# Patient Record
Sex: Male | Born: 1955 | Race: White | Hispanic: No | Marital: Married | Smoking: Former smoker
Health system: Southern US, Community
[De-identification: ages and names within clinical notes are randomized; demographics above are authoritative.]

## PROBLEM LIST (undated history)

## (undated) DIAGNOSIS — E785 Hyperlipidemia, unspecified: Secondary | ICD-10-CM

## (undated) DIAGNOSIS — R319 Hematuria, unspecified: Secondary | ICD-10-CM

## (undated) DIAGNOSIS — N1330 Unspecified hydronephrosis: Secondary | ICD-10-CM

## (undated) DIAGNOSIS — E119 Type 2 diabetes mellitus without complications: Secondary | ICD-10-CM

## (undated) DIAGNOSIS — G43909 Migraine, unspecified, not intractable, without status migrainosus: Secondary | ICD-10-CM

## (undated) DIAGNOSIS — N183 Chronic kidney disease, stage 3 unspecified: Secondary | ICD-10-CM

## (undated) DIAGNOSIS — Z87442 Personal history of urinary calculi: Secondary | ICD-10-CM

## (undated) DIAGNOSIS — N2581 Secondary hyperparathyroidism of renal origin: Secondary | ICD-10-CM

## (undated) DIAGNOSIS — K219 Gastro-esophageal reflux disease without esophagitis: Secondary | ICD-10-CM

## (undated) DIAGNOSIS — N189 Chronic kidney disease, unspecified: Secondary | ICD-10-CM

## (undated) DIAGNOSIS — J309 Allergic rhinitis, unspecified: Secondary | ICD-10-CM

## (undated) DIAGNOSIS — I10 Essential (primary) hypertension: Secondary | ICD-10-CM

## (undated) DIAGNOSIS — D631 Anemia in chronic kidney disease: Secondary | ICD-10-CM

## (undated) DIAGNOSIS — N281 Cyst of kidney, acquired: Secondary | ICD-10-CM

## (undated) HISTORY — PX: FRACTURE SURGERY: SHX138

## (undated) HISTORY — DX: Essential (primary) hypertension: I10

## (undated) HISTORY — PX: LASIK: SHX215

## (undated) HISTORY — DX: Gastro-esophageal reflux disease without esophagitis: K21.9

## (undated) HISTORY — DX: Hyperlipidemia, unspecified: E78.5

## (undated) HISTORY — DX: Migraine, unspecified, not intractable, without status migrainosus: G43.909

---

## 1976-05-21 DIAGNOSIS — G43809 Other migraine, not intractable, without status migrainosus: Secondary | ICD-10-CM | POA: Insufficient documentation

## 1977-12-19 HISTORY — PX: PARTIAL NEPHRECTOMY: SHX414

## 1978-05-21 DIAGNOSIS — K219 Gastro-esophageal reflux disease without esophagitis: Secondary | ICD-10-CM | POA: Insufficient documentation

## 1978-05-21 DIAGNOSIS — I10 Essential (primary) hypertension: Secondary | ICD-10-CM

## 1978-05-21 HISTORY — DX: Essential (primary) hypertension: I10

## 1978-05-21 HISTORY — DX: Gastro-esophageal reflux disease without esophagitis: K21.9

## 1985-05-21 DIAGNOSIS — M109 Gout, unspecified: Secondary | ICD-10-CM | POA: Insufficient documentation

## 1988-05-21 DIAGNOSIS — E782 Mixed hyperlipidemia: Secondary | ICD-10-CM | POA: Insufficient documentation

## 1988-05-21 DIAGNOSIS — E785 Hyperlipidemia, unspecified: Secondary | ICD-10-CM | POA: Insufficient documentation

## 1988-05-21 HISTORY — DX: Hyperlipidemia, unspecified: E78.5

## 2001-10-15 ENCOUNTER — Encounter: Payer: Self-pay | Admitting: Family Medicine

## 2001-10-15 ENCOUNTER — Encounter: Admission: RE | Admit: 2001-10-15 | Discharge: 2001-10-15 | Payer: Self-pay | Admitting: Family Medicine

## 2002-01-19 ENCOUNTER — Encounter: Payer: Self-pay | Admitting: Family Medicine

## 2002-01-19 LAB — CONVERTED CEMR LAB
PSA: 0.8 ng/mL
PSA: 0.8 ng/mL

## 2002-10-20 ENCOUNTER — Encounter: Payer: Self-pay | Admitting: Family Medicine

## 2002-10-20 LAB — CONVERTED CEMR LAB
PSA: 0.9 ng/mL
PSA: 0.9 ng/mL

## 2003-11-19 ENCOUNTER — Encounter: Payer: Self-pay | Admitting: Family Medicine

## 2003-11-19 LAB — CONVERTED CEMR LAB
PSA: 0.8 ng/mL
PSA: 0.8 ng/mL

## 2004-06-01 ENCOUNTER — Ambulatory Visit: Payer: Self-pay | Admitting: Family Medicine

## 2004-11-30 ENCOUNTER — Ambulatory Visit: Payer: Self-pay | Admitting: Family Medicine

## 2004-12-01 ENCOUNTER — Ambulatory Visit: Payer: Self-pay | Admitting: Family Medicine

## 2005-01-19 ENCOUNTER — Encounter: Payer: Self-pay | Admitting: Family Medicine

## 2005-01-19 LAB — CONVERTED CEMR LAB
PSA: 1.6 ng/mL
PSA: 1.6 ng/mL

## 2005-08-02 ENCOUNTER — Ambulatory Visit: Payer: Self-pay | Admitting: Family Medicine

## 2006-03-18 ENCOUNTER — Ambulatory Visit: Payer: Self-pay | Admitting: Family Medicine

## 2006-05-09 ENCOUNTER — Ambulatory Visit: Payer: Self-pay | Admitting: Family Medicine

## 2006-05-16 ENCOUNTER — Ambulatory Visit: Payer: Self-pay | Admitting: Family Medicine

## 2006-06-14 ENCOUNTER — Ambulatory Visit: Payer: Self-pay | Admitting: Family Medicine

## 2006-07-19 ENCOUNTER — Emergency Department (HOSPITAL_COMMUNITY): Admission: EM | Admit: 2006-07-19 | Discharge: 2006-07-19 | Payer: Self-pay | Admitting: Emergency Medicine

## 2006-08-20 ENCOUNTER — Encounter: Payer: Self-pay | Admitting: Family Medicine

## 2006-08-20 LAB — CONVERTED CEMR LAB: PSA: 0.69 ng/mL

## 2006-08-22 ENCOUNTER — Ambulatory Visit: Payer: Self-pay | Admitting: Family Medicine

## 2006-08-22 LAB — CONVERTED CEMR LAB
ALT: 47 units/L — ABNORMAL HIGH (ref 0–40)
AST: 33 units/L (ref 0–37)
Albumin: 3.7 g/dL (ref 3.5–5.2)
Alkaline Phosphatase: 77 units/L (ref 39–117)
BUN: 16 mg/dL (ref 6–23)
Basophils Absolute: 0 10*3/uL (ref 0.0–0.1)
Basophils Relative: 0.4 % (ref 0.0–1.0)
Bilirubin, Direct: 0.1 mg/dL (ref 0.0–0.3)
CO2: 27 meq/L (ref 19–32)
Calcium: 9.1 mg/dL (ref 8.4–10.5)
Chloride: 107 meq/L (ref 96–112)
Cholesterol: 220 mg/dL (ref 0–200)
Creatinine, Ser: 1.3 mg/dL (ref 0.4–1.5)
Direct LDL: 149.5 mg/dL
Eosinophils Absolute: 0.2 10*3/uL (ref 0.0–0.6)
Eosinophils Relative: 3.7 % (ref 0.0–5.0)
GFR calc Af Amer: 75 mL/min
GFR calc non Af Amer: 62 mL/min
Glucose, Bld: 110 mg/dL — ABNORMAL HIGH (ref 70–99)
HCT: 43.8 % (ref 39.0–52.0)
HDL: 44.4 mg/dL (ref >39.0)
Hemoglobin: 15.1 g/dL (ref 13.0–17.0)
Lymphocytes Relative: 36.4 % (ref 12.0–46.0)
MCHC: 34.6 g/dL (ref 30.0–36.0)
MCV: 93.3 fL (ref 78.0–100.0)
Monocytes Absolute: 0.6 10*3/uL (ref 0.2–0.7)
Monocytes Relative: 11 % (ref 3.0–11.0)
Neutro Abs: 2.4 10*3/uL (ref 1.4–7.7)
Neutrophils Relative %: 48.5 % (ref 43.0–77.0)
PSA: 0.69 ng/mL (ref 0.10–4.00)
Platelets: 195 10*3/uL (ref 150–400)
Potassium: 4.1 meq/L (ref 3.5–5.1)
RBC: 4.69 M/uL (ref 4.22–5.81)
RDW: 12.3 % (ref 11.5–14.6)
Sodium: 139 meq/L (ref 135–145)
TSH: 1.81 microintl units/mL (ref 0.35–5.50)
Total Bilirubin: 0.8 mg/dL (ref 0.3–1.2)
Total CHOL/HDL Ratio: 5
Total Protein: 6.9 g/dL (ref 6.0–8.3)
Triglycerides: 152 mg/dL — ABNORMAL HIGH (ref 0–149)
Uric Acid, Serum: 8.6 mg/dL — ABNORMAL HIGH (ref 2.4–7.0)
VLDL: 30 mg/dL (ref 0–40)
WBC: 5 10*3/uL (ref 4.5–10.5)

## 2006-08-27 ENCOUNTER — Encounter: Payer: Self-pay | Admitting: Family Medicine

## 2006-08-27 ENCOUNTER — Ambulatory Visit: Payer: Self-pay | Admitting: Family Medicine

## 2006-08-27 DIAGNOSIS — R05 Cough: Secondary | ICD-10-CM

## 2006-08-27 DIAGNOSIS — R059 Cough, unspecified: Secondary | ICD-10-CM | POA: Insufficient documentation

## 2006-10-10 ENCOUNTER — Telehealth (INDEPENDENT_AMBULATORY_CARE_PROVIDER_SITE_OTHER): Payer: Self-pay | Admitting: *Deleted

## 2006-11-04 ENCOUNTER — Telehealth (INDEPENDENT_AMBULATORY_CARE_PROVIDER_SITE_OTHER): Payer: Self-pay | Admitting: *Deleted

## 2006-11-29 ENCOUNTER — Encounter: Payer: Self-pay | Admitting: Family Medicine

## 2006-12-02 ENCOUNTER — Ambulatory Visit: Payer: Self-pay | Admitting: Family Medicine

## 2006-12-02 LAB — CONVERTED CEMR LAB
BUN: 18 mg/dL (ref 6–23)
CO2: 30 meq/L (ref 19–32)
Calcium: 9.1 mg/dL (ref 8.4–10.5)
Chloride: 105 meq/L (ref 96–112)
Creatinine, Ser: 1.2 mg/dL (ref 0.4–1.5)
GFR calc Af Amer: 82 mL/min
GFR calc non Af Amer: 68 mL/min
Glucose, Bld: 104 mg/dL — ABNORMAL HIGH (ref 70–99)
Potassium: 4.3 meq/L (ref 3.5–5.1)
Sodium: 141 meq/L (ref 135–145)

## 2006-12-04 ENCOUNTER — Ambulatory Visit: Payer: Self-pay | Admitting: Family Medicine

## 2006-12-24 ENCOUNTER — Telehealth (INDEPENDENT_AMBULATORY_CARE_PROVIDER_SITE_OTHER): Payer: Self-pay | Admitting: *Deleted

## 2007-02-18 ENCOUNTER — Telehealth (INDEPENDENT_AMBULATORY_CARE_PROVIDER_SITE_OTHER): Payer: Self-pay | Admitting: *Deleted

## 2007-03-04 ENCOUNTER — Telehealth: Payer: Self-pay | Admitting: Family Medicine

## 2007-03-14 ENCOUNTER — Telehealth (INDEPENDENT_AMBULATORY_CARE_PROVIDER_SITE_OTHER): Payer: Self-pay | Admitting: *Deleted

## 2007-04-11 ENCOUNTER — Telehealth (INDEPENDENT_AMBULATORY_CARE_PROVIDER_SITE_OTHER): Payer: Self-pay | Admitting: *Deleted

## 2007-04-20 ENCOUNTER — Emergency Department (HOSPITAL_COMMUNITY): Admission: EM | Admit: 2007-04-20 | Discharge: 2007-04-20 | Payer: Self-pay | Admitting: Emergency Medicine

## 2007-04-21 ENCOUNTER — Ambulatory Visit: Payer: Self-pay | Admitting: Family Medicine

## 2007-04-21 DIAGNOSIS — E669 Obesity, unspecified: Secondary | ICD-10-CM | POA: Insufficient documentation

## 2007-05-14 ENCOUNTER — Telehealth: Payer: Self-pay | Admitting: Family Medicine

## 2007-06-09 ENCOUNTER — Telehealth (INDEPENDENT_AMBULATORY_CARE_PROVIDER_SITE_OTHER): Payer: Self-pay | Admitting: *Deleted

## 2007-07-04 ENCOUNTER — Telehealth: Payer: Self-pay | Admitting: Family Medicine

## 2007-07-25 ENCOUNTER — Encounter: Payer: Self-pay | Admitting: Family Medicine

## 2007-08-04 ENCOUNTER — Telehealth: Payer: Self-pay | Admitting: Family Medicine

## 2007-09-11 ENCOUNTER — Telehealth: Payer: Self-pay | Admitting: Family Medicine

## 2007-09-17 ENCOUNTER — Ambulatory Visit: Payer: Self-pay | Admitting: Family Medicine

## 2007-09-22 ENCOUNTER — Telehealth (INDEPENDENT_AMBULATORY_CARE_PROVIDER_SITE_OTHER): Payer: Self-pay | Admitting: Internal Medicine

## 2007-09-22 ENCOUNTER — Ambulatory Visit: Payer: Self-pay | Admitting: Internal Medicine

## 2007-11-06 ENCOUNTER — Telehealth: Payer: Self-pay | Admitting: Family Medicine

## 2007-11-17 ENCOUNTER — Telehealth: Payer: Self-pay | Admitting: Family Medicine

## 2008-02-10 ENCOUNTER — Ambulatory Visit: Payer: Self-pay | Admitting: Family Medicine

## 2008-02-17 ENCOUNTER — Ambulatory Visit: Payer: Self-pay | Admitting: Family Medicine

## 2008-06-03 ENCOUNTER — Telehealth (INDEPENDENT_AMBULATORY_CARE_PROVIDER_SITE_OTHER): Payer: Self-pay | Admitting: *Deleted

## 2008-06-03 ENCOUNTER — Encounter: Payer: Self-pay | Admitting: Family Medicine

## 2008-06-10 ENCOUNTER — Ambulatory Visit: Payer: Self-pay | Admitting: Family Medicine

## 2008-06-10 DIAGNOSIS — G479 Sleep disorder, unspecified: Secondary | ICD-10-CM | POA: Insufficient documentation

## 2008-06-30 ENCOUNTER — Ambulatory Visit: Payer: Self-pay | Admitting: Family Medicine

## 2008-06-30 DIAGNOSIS — F411 Generalized anxiety disorder: Secondary | ICD-10-CM | POA: Insufficient documentation

## 2008-07-06 ENCOUNTER — Ambulatory Visit: Payer: Self-pay | Admitting: Professional

## 2008-07-08 ENCOUNTER — Ambulatory Visit: Payer: Self-pay | Admitting: Family Medicine

## 2008-07-13 ENCOUNTER — Ambulatory Visit: Payer: Self-pay | Admitting: Professional

## 2008-07-23 ENCOUNTER — Telehealth (INDEPENDENT_AMBULATORY_CARE_PROVIDER_SITE_OTHER): Payer: Self-pay | Admitting: Internal Medicine

## 2008-07-27 ENCOUNTER — Ambulatory Visit: Payer: Self-pay | Admitting: Professional

## 2008-08-05 ENCOUNTER — Ambulatory Visit: Payer: Self-pay | Admitting: Family Medicine

## 2008-08-17 ENCOUNTER — Ambulatory Visit: Payer: Self-pay | Admitting: Family Medicine

## 2008-08-18 ENCOUNTER — Telehealth: Payer: Self-pay | Admitting: Family Medicine

## 2008-10-27 ENCOUNTER — Ambulatory Visit: Payer: Self-pay | Admitting: Family Medicine

## 2008-10-28 LAB — CONVERTED CEMR LAB
BUN: 21 mg/dL (ref 6–23)
CO2: 22 meq/L (ref 19–32)
Calcium: 9.6 mg/dL (ref 8.4–10.5)
Chloride: 104 meq/L (ref 96–112)
Creatinine, Ser: 1.39 mg/dL (ref 0.40–1.50)
Glucose, Bld: 98 mg/dL (ref 70–99)
Potassium: 5 meq/L (ref 3.5–5.3)
Sodium: 139 meq/L (ref 135–145)
Uric Acid, Serum: 6.3 mg/dL (ref 4.0–7.8)

## 2008-11-01 ENCOUNTER — Ambulatory Visit: Payer: Self-pay | Admitting: Family Medicine

## 2008-11-09 ENCOUNTER — Ambulatory Visit: Payer: Self-pay | Admitting: Family Medicine

## 2008-11-09 LAB — CONVERTED CEMR LAB: Cholesterol, target level: 200 mg/dL

## 2008-11-11 ENCOUNTER — Ambulatory Visit: Payer: Self-pay | Admitting: Professional

## 2008-11-16 ENCOUNTER — Ambulatory Visit: Payer: Self-pay | Admitting: Professional

## 2009-02-23 ENCOUNTER — Ambulatory Visit: Payer: Self-pay | Admitting: Family Medicine

## 2009-02-24 LAB — CONVERTED CEMR LAB
AST: 56 units/L — ABNORMAL HIGH (ref 0–37)
Alkaline Phosphatase: 70 units/L (ref 39–117)
BUN: 20 mg/dL (ref 6–23)
Basophils Absolute: 0 10*3/uL (ref 0.0–0.1)
CO2: 26 meq/L (ref 19–32)
Calcium: 9.7 mg/dL (ref 8.4–10.5)
Cholesterol: 248 mg/dL — ABNORMAL HIGH (ref 0–200)
Creatinine, Ser: 1.4 mg/dL (ref 0.4–1.5)
Eosinophils Absolute: 0.2 10*3/uL (ref 0.0–0.7)
GFR calc non Af Amer: 55.91 mL/min (ref >60)
Glucose, Bld: 121 mg/dL — ABNORMAL HIGH (ref 70–99)
Lymphocytes Relative: 39.5 % (ref 12.0–46.0)
MCHC: 34.2 g/dL (ref 30.0–36.0)
Monocytes Relative: 12.1 % — ABNORMAL HIGH (ref 3.0–12.0)
PSA: 0.75 ng/mL (ref 0.10–4.00)
Platelets: 177 10*3/uL (ref 150.0–400.0)
RDW: 14.4 % (ref 11.5–14.6)
TSH: 1.59 microintl units/mL (ref 0.35–5.50)
Total Bilirubin: 0.8 mg/dL (ref 0.3–1.2)
Total CHOL/HDL Ratio: 6
Triglycerides: 244 mg/dL — ABNORMAL HIGH (ref 0.0–149.0)
VLDL: 48.8 mg/dL — ABNORMAL HIGH (ref 0.0–40.0)

## 2009-04-05 ENCOUNTER — Ambulatory Visit: Payer: Self-pay | Admitting: Family Medicine

## 2009-04-12 ENCOUNTER — Ambulatory Visit: Payer: Self-pay | Admitting: Family Medicine

## 2009-04-12 ENCOUNTER — Emergency Department (HOSPITAL_COMMUNITY): Admission: EM | Admit: 2009-04-12 | Discharge: 2009-04-12 | Payer: Self-pay | Admitting: Emergency Medicine

## 2009-04-12 LAB — CONVERTED CEMR LAB: AST: 45 units/L — ABNORMAL HIGH (ref 0–37)

## 2009-04-25 ENCOUNTER — Telehealth: Payer: Self-pay | Admitting: Family Medicine

## 2009-05-30 ENCOUNTER — Ambulatory Visit: Payer: Self-pay | Admitting: Family Medicine

## 2009-05-30 LAB — CONVERTED CEMR LAB
ALT: 29 units/L (ref 0–53)
Cholesterol: 134 mg/dL (ref 0–200)
HDL: 43.9 mg/dL (ref >39.00)
Total CHOL/HDL Ratio: 3
Triglycerides: 108 mg/dL (ref 0.0–149.0)

## 2009-06-03 ENCOUNTER — Telehealth: Payer: Self-pay | Admitting: Family Medicine

## 2009-06-16 ENCOUNTER — Ambulatory Visit: Payer: Self-pay | Admitting: Family Medicine

## 2009-06-17 ENCOUNTER — Telehealth: Payer: Self-pay | Admitting: Family Medicine

## 2009-06-20 ENCOUNTER — Telehealth: Payer: Self-pay | Admitting: Family Medicine

## 2009-06-21 ENCOUNTER — Telehealth (INDEPENDENT_AMBULATORY_CARE_PROVIDER_SITE_OTHER): Payer: Self-pay | Admitting: *Deleted

## 2009-06-22 ENCOUNTER — Encounter: Payer: Self-pay | Admitting: Family Medicine

## 2009-06-30 ENCOUNTER — Encounter (INDEPENDENT_AMBULATORY_CARE_PROVIDER_SITE_OTHER): Payer: Self-pay | Admitting: *Deleted

## 2009-07-11 ENCOUNTER — Telehealth: Payer: Self-pay | Admitting: Family Medicine

## 2009-08-15 ENCOUNTER — Telehealth: Payer: Self-pay | Admitting: Family Medicine

## 2009-10-13 ENCOUNTER — Telehealth: Payer: Self-pay | Admitting: Family Medicine

## 2009-10-13 ENCOUNTER — Ambulatory Visit: Payer: Self-pay | Admitting: Family Medicine

## 2009-10-13 LAB — CONVERTED CEMR LAB
AST: 39 units/L — ABNORMAL HIGH (ref 0–37)
Albumin: 4 g/dL (ref 3.5–5.2)
Alkaline Phosphatase: 82 units/L (ref 39–117)
Basophils Relative: 0.9 % (ref 0.0–3.0)
Bilirubin, Direct: 0.1 mg/dL (ref 0.0–0.3)
CO2: 27 meq/L (ref 19–32)
Calcium: 9.7 mg/dL (ref 8.4–10.5)
Creatinine, Ser: 1.4 mg/dL (ref 0.4–1.5)
Eosinophils Absolute: 0.3 10*3/uL (ref 0.0–0.7)
Eosinophils Relative: 5.2 % — ABNORMAL HIGH (ref 0.0–5.0)
GFR calc non Af Amer: 55.32 mL/min (ref >60)
HDL: 43 mg/dL (ref >39.00)
Hemoglobin: 13.6 g/dL (ref 13.0–17.0)
LDL Cholesterol: 71 mg/dL (ref 0–99)
Lymphocytes Relative: 34.1 % (ref 12.0–46.0)
MCHC: 33.8 g/dL (ref 30.0–36.0)
Monocytes Relative: 11.2 % (ref 3.0–12.0)
Neutro Abs: 2.8 10*3/uL (ref 1.4–7.7)
Neutrophils Relative %: 48.6 % (ref 43.0–77.0)
PSA: 0.57 ng/mL (ref 0.10–4.00)
RBC: 4.01 M/uL — ABNORMAL LOW (ref 4.22–5.81)
Sodium: 138 meq/L (ref 135–145)
Total CHOL/HDL Ratio: 4
Total Protein: 7.1 g/dL (ref 6.0–8.3)
Triglycerides: 193 mg/dL — ABNORMAL HIGH (ref 0.0–149.0)
VLDL: 38.6 mg/dL (ref 0.0–40.0)
WBC: 5.9 10*3/uL (ref 4.5–10.5)

## 2009-10-20 ENCOUNTER — Ambulatory Visit: Payer: Self-pay | Admitting: Family Medicine

## 2009-10-20 DIAGNOSIS — L309 Dermatitis, unspecified: Secondary | ICD-10-CM | POA: Insufficient documentation

## 2009-10-20 DIAGNOSIS — L259 Unspecified contact dermatitis, unspecified cause: Secondary | ICD-10-CM

## 2009-10-20 DIAGNOSIS — R21 Rash and other nonspecific skin eruption: Secondary | ICD-10-CM | POA: Insufficient documentation

## 2009-12-27 ENCOUNTER — Encounter (INDEPENDENT_AMBULATORY_CARE_PROVIDER_SITE_OTHER): Payer: Self-pay | Admitting: *Deleted

## 2010-03-13 ENCOUNTER — Ambulatory Visit: Payer: Self-pay | Admitting: Family Medicine

## 2010-03-14 LAB — CONVERTED CEMR LAB
Glucose, Bld: 155 mg/dL — ABNORMAL HIGH (ref 70–99)
Microalb Creat Ratio: 77.2 mg/g — ABNORMAL HIGH (ref 0.0–30.0)
Uric Acid, Serum: 5.4 mg/dL (ref 4.0–7.8)

## 2010-03-17 ENCOUNTER — Ambulatory Visit: Payer: Self-pay | Admitting: Family Medicine

## 2010-03-17 DIAGNOSIS — E119 Type 2 diabetes mellitus without complications: Secondary | ICD-10-CM

## 2010-03-17 DIAGNOSIS — IMO0002 Reserved for concepts with insufficient information to code with codable children: Secondary | ICD-10-CM | POA: Insufficient documentation

## 2010-03-17 DIAGNOSIS — L989 Disorder of the skin and subcutaneous tissue, unspecified: Secondary | ICD-10-CM | POA: Insufficient documentation

## 2010-03-24 ENCOUNTER — Ambulatory Visit: Payer: Self-pay | Admitting: Family Medicine

## 2010-03-27 ENCOUNTER — Encounter (INDEPENDENT_AMBULATORY_CARE_PROVIDER_SITE_OTHER): Payer: Self-pay | Admitting: *Deleted

## 2010-03-27 LAB — CONVERTED CEMR LAB: Fecal Occult Bld: NEGATIVE

## 2010-03-28 ENCOUNTER — Ambulatory Visit: Payer: Self-pay | Admitting: Family Medicine

## 2010-03-28 ENCOUNTER — Encounter: Payer: Self-pay | Admitting: Family Medicine

## 2010-03-29 ENCOUNTER — Encounter (INDEPENDENT_AMBULATORY_CARE_PROVIDER_SITE_OTHER): Payer: Self-pay | Admitting: *Deleted

## 2010-05-01 ENCOUNTER — Telehealth: Payer: Self-pay | Admitting: Family Medicine

## 2010-05-08 ENCOUNTER — Encounter: Payer: Self-pay | Admitting: Family Medicine

## 2010-06-06 ENCOUNTER — Encounter: Payer: Self-pay | Admitting: Family Medicine

## 2010-06-06 ENCOUNTER — Telehealth (INDEPENDENT_AMBULATORY_CARE_PROVIDER_SITE_OTHER): Payer: Self-pay | Admitting: *Deleted

## 2010-06-06 ENCOUNTER — Ambulatory Visit
Admission: RE | Admit: 2010-06-06 | Discharge: 2010-06-06 | Payer: Self-pay | Source: Home / Self Care | Attending: Family Medicine | Admitting: Family Medicine

## 2010-06-06 DIAGNOSIS — R5381 Other malaise: Secondary | ICD-10-CM | POA: Insufficient documentation

## 2010-06-06 DIAGNOSIS — R5383 Other fatigue: Secondary | ICD-10-CM | POA: Insufficient documentation

## 2010-06-07 LAB — CONVERTED CEMR LAB: Hgb A1c MFr Bld: 6.5 % — ABNORMAL HIGH (ref <5.7)

## 2010-06-09 ENCOUNTER — Ambulatory Visit
Admission: RE | Admit: 2010-06-09 | Discharge: 2010-06-09 | Payer: Self-pay | Source: Home / Self Care | Attending: Family Medicine | Admitting: Family Medicine

## 2010-06-09 LAB — HM DIABETES FOOT EXAM

## 2010-06-22 NOTE — Progress Notes (Signed)
Summary: INDOMETHACIN 31 MG  Phone Note Refill Request Message from:  Patient on June 03, 2009 4:03 PM  Refills Requested: Medication #1:  INDOMETHACIN 50 MG  CAPS one tab by mouth three times a day for two days then two times a day  as needed for gouty pain until pain stops   Last Refilled: 11/17/2007 Patient says that he has had gout flare up for the past two weeks and it was exceptionally bad today. Wants it sent to [facility] on [address].   Initial call taken by: Lacretia Nicks,  June 03, 2009 4:55 PM  Follow-up for Phone Call        px written on EMR for call in  Follow-up by: Allena Earing MD,  June 03, 2009 5:00 PM    New/Updated Medications: INDOMETHACIN 50 MG  CAPS (INDOMETHACIN) one tab by mouth three times a day for two days then two times a day  as needed for gouty pain until pain stops Prescriptions: INDOMETHACIN 50 MG  CAPS (INDOMETHACIN) one tab by mouth three times a day for two days then two times a day  as needed for gouty pain until pain stops  #30 x 0   Entered by:   Christena Deem CMA ([place])   Authorized by:   Allena Earing MD   Signed by:   Christena Deem CMA ([place]) on 06/03/2009   Method used:   Electronically to        [facility] (retail)       [address] [place], [place]  [address]       Ph: [phone]       Fax: [phone]   RxID:   [id number] INDOMETHACIN 50 MG  CAPS (INDOMETHACIN) one tab by mouth three times a day for two days then two times a day  as needed for gouty pain until pain stops  #30 x 0   Entered and Authorized by:   Allena Earing MD   Signed by:   Allena Earing MD on 06/03/2009   Method used:   Telephoned to ...       [id number] (retail)       [place], [place]       [place], [place]  [address]       Ph: [phone]       Fax: [phone]   RxID:   [id number]

## 2010-06-22 NOTE — Progress Notes (Signed)
Summary: Rx Colchicine or something for gout  Phone Note Refill Request Call back at Home Phone [phone] Message from:  Patient on June 17, 2009 9:41 AM  Patient came in to see Dr. Council Mechanic yesterday and told the doctor that he was having a flare up with gout and needed his Colchicine 0.6mg .  Dr. Council Mechanic did not send in the Rx.  His wife went to the pharmacy thinking the medication had been sent in and was told by the pharmacist that Colchicine had been taken off the market.  Patient needs something for his gout.  Please advise.   Method Requested: Electronic Initial call taken by: Sherrian Divers CMA Deborra Medina),  June 17, 2009 9:43 AM  Follow-up for Phone Call        Read office note, appears Dr. Council Mechanic did want him to restart colchcine.  Will call in. Follow-up by: Arnette Norris MD,  June 17, 2009 10:04 AM  Additional Follow-up for Phone Call Additional follow up Details #1::        Okay, so has it not been taken off the market? Lugene Fuquay CMA Deborra Medina)  June 17, 2009 10:50 AM     Additional Follow-up for Phone Call Additional follow up Details #2::    As far as I understand, pharmacies still carrying it.  I am kind of confused as to what Dr. Council Mechanic wanted to do in reading his note.  He says to get back on colchcine but increased his allopurinol and removed colchicine from list.  I really would like to send this to Dr. Council Mechanic to get his input.  Prescription already sent. Follow-up by: Arnette Norris MD,  June 17, 2009 10:57 AM  Additional Follow-up for Phone Call Additional follow up Details #3:: Details for Additional Follow-up Action Taken: I intended him to start back on the Colchicine and thought I heard him tell me he still had some. I also increased his Allopurinol because of the flare and level 6.3 (slightly above goal). Thank you for getting the script in. He should be taking Colchicine 0.6 two times a day and increase his Allopurinol to 300+100 daily. Thanks to all  for your help. Additional Follow-up by: Raenette Rover MD,  June 17, 2009 1:36 PM  New/Updated Medications: COLCHICINE 0.6 MG TABS (COLCHICINE) 1 tab by mouth bid Prescriptions: COLCHICINE 0.6 MG TABS (COLCHICINE) 1 tab by mouth bid  #60 x 0   Entered and Authorized by:   Arnette Norris MD   Signed by:   Arnette Norris MD on 06/17/2009   Method used:   Electronically to        [id number] (retail)       [address] [place], [place]  [address]       Ph: [phone]       Fax: [phone]   RxID:   [id number]     Anytime.  You have always been a great help to me.  I just wanted to make sure I was doing what you were intending. Arnette Norris MD  June 17, 2009 1:38 PM  Appended Document: Rx Colchicine or something for gout [facility] said [facility] said Col[facility]icine was no longer available and name brand was $76.00 Pt said [facility] [place] had [facility]. Pt was going to get med transferred from [facility] to [place].

## 2010-06-22 NOTE — Assessment & Plan Note (Signed)
Summary: Follow-up on lab work   Vital Signs:  Patient profile:   55 year old male Weight:      281 pounds Temp:     98.2 degrees F oral Pulse rate:   60 / minute Pulse rhythm:   regular BP sitting:   110 / 78  (left arm) Cuff size:   large  Vitals Entered By: Emelia Salisbury LPN (January 27, 624THL 2:44 PM) CC: Follow-up on labs   History of Present Illness: Pt here for followup of chol having started on Simva in 10/10. He then added Niacin on his own not long thereafter. He feels well and has tolerated both medicines w/o problem. Niacin has not caused flushing. He also has recently had pain, swelling redness and warmth of hiss right foot after recently stopping Colchicine. He started back on it today. He had labs done yesterday. He is on Allopurinol at 300mg . He had been stable.  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 Two Times A Day 2)  Multivitamins   Tabs (Multiple Vitamin) .... As Needed 3)  Advil 200 Mg  Tabs (Ibuprofen) .... 3 By Mouth Every Morning As Needed 4)  Indomethacin 50 Mg  Caps (Indomethacin) .... One Tab By Mouth Three Times A Day For Two Days Then Two Times A Day  As Needed For Gouty Pain Until Pain Stops 5)  Treximet 85-500 Mg  Tabs (Sumatriptan-Naproxen Sodium) .... One Tab By Mouth At Onset of H/a, May Repeat Once in Two Hours. 6)  Metoprolol Succinate 100 Mg Xr24h-Tab (Metoprolol Succinate) .... One Tab By Mouth At Night. 7)  Lisinopril 10  Mg Tabs (Lisinopril) .... Take One By Mouth Daily 8)  Zyloprim 300 Mg Tabs (Allopurinol) .... One Tab By Mouth Once Daily  Allergies: 1)  ! Codeine Sulfate (Codeine Sulfate)  Physical Exam  General:  Well-developed,well-nourished,in no acute distress; alert,appropriate and cooperative throughout examination Head:  Normocephalic and atraumatic without obvious abnormalities. No apparent alopecia or balding. Eyes:  Conjunctiva clear bilaterally Ears:  External ear exam shows no significant lesions or deformities.  Otoscopic examination reveals clear canals, tympanic membranes are intact bilaterally without bulging, retraction, inflammation or discharge. Hearing is grossly normal bilaterally. Nose:  External nasal examination shows no deformity or inflammation. Nasal mucosa are pink and moist without lesions or exudates. Mouth:  Oral mucosa and oropharynx without lesions or exudates.  Teeth in good repair. Lungs:  Normal respiratory effort, chest expands symmetrically. Lungs are clear to auscultation, no crackles or wheezes. Heart:  Normal rate (altho elevated today over nml)  and regular rhythm. S1 and S2 normal without gallop, murmur, click, rub or other extra sounds. Extremities:  Mildly swollen right foot distally, esp around the big toe with faint redness and tenderness, minimal warmth.   Impression & Recommendations:  Problem # 1:  HYPERLIPIDEMIA (B2193296.4) Assessment Improved Significant improvement on curr meds. Cont. Liver fine. His updated medication list for this problem includes:    Simvastatin 40 Mg Tabs (Simvastatin) ..... One  tab by mouth at night  Labs Reviewed: SGOT: 29 (05/30/2009)   SGPT: 29 (05/30/2009)  Lipid Goals: Chol Goal: 200 (11/09/2008)   HDL Goal: 40 (11/09/2008)   LDL Goal: 130 (11/09/2008)   TG Goal: 150 (11/09/2008)  Prior 10 Yr Risk Heart Disease: Not enough information (11/09/2008)   HDL:43.90 (05/30/2009), 38.50 (02/23/2009)  LDL:69 (05/30/2009), DEL  (08/22/2006)  Chol:134 (05/30/2009), 248 (02/23/2009)  Trig:108.0 (05/30/2009), 244.0 (02/23/2009)  Problem # 2:  GOUT (ICD-274.9) Will incr Allopurinol after getting back on Colchicine The following medications were removed from the medication list:    Colchicine 0.6 Mg Tabs (Colchicine) .Marland Kitchen..Marland Kitchen Two times a day His updated medication list for this problem includes:    Advil 200 Mg Tabs (Ibuprofen) .Marland KitchenMarland KitchenMarland KitchenMarland Kitchen 3 by mouth every morning as needed    Indomethacin 50 Mg Caps (Indomethacin) ..... One tab by mouth three times a day for two days then two times a day  as needed for gouty pain until pain stops    Zyloprim 300 Mg Tabs (Allopurinol) ..... One tab by mouth once daily    Zyloprim 100 Mg Tabs (Allopurinol) ..... One tab by mouth once daily Uric acid 6.3 after outbreak.  Elevate extremity; warm compresses, symptomatic relief and medication as directed.   Problem # 3:  HYPERTENSION (ICD-401.9) Assessment: Unchanged  His updated medication list for this problem includes:    Metoprolol Succinate 100 Mg Xr24h-tab (Metoprolol succinate) ..... One tab by mouth at night.    Lisinopril 10 Mg Tabs (Lisinopril) .Marland Kitchen... Take one by mouth daily  BP today: 110/78 Prior BP: 122/88 (02/23/2009)  Prior 10 Yr Risk Heart Disease: Not enough information (11/09/2008)  Labs Reviewed: K+: 4.4 (02/23/2009) Creat: : 1.4 (02/23/2009)   Chol: 134 (05/30/2009)   HDL: 43.90 (05/30/2009)   LDL: 69 (05/30/2009)   TG: 108.0 (05/30/2009)  Complete Medication List: 1)  Multivitamins Tabs (Multiple vitamin) .... As needed 2)  Advil 200 Mg Tabs (Ibuprofen) .... 3 by mouth every morning as needed 3)  Indomethacin 50 Mg Caps (Indomethacin) .... One tab by mouth three times a day for two days then two times a day  as needed for gouty pain until pain stops 4)  Treximet 85-500 Mg Tabs (Sumatriptan-naproxen sodium) .... One tab by mouth at onset of h/a, may repeat once in two hours. 5)  Metoprolol Succinate 100 Mg Xr24h-tab  (Metoprolol succinate) .... One tab by mouth at night. 6)  Lisinopril 10 Mg Tabs (Lisinopril) .... Take one by mouth daily 7)  Zyloprim 300 Mg Tabs (Allopurinol) .... One tab by mouth once daily 8)  Zyloprim 100 Mg Tabs (Allopurinol) .... One tab by mouth once daily 9)  Simvastatin 40 Mg Tabs (Simvastatin) .... One tab by mouth at night  Patient Instructions: 1)  RTC for BMet and Uric Acid 274.9 in one month, will report via phone tree.  Prescriptions: ZYLOPRIM 100 MG TABS (ALLOPURINOL) one tab by mouth once daily  #30 x 12   Entered and Authorized by:   Raenette Rover MD   Signed by:   Raenette Rover MD on 06/16/2009   Method used:   Electronically to        [facility] (retail)       [address], [place]       [place], [place]  [address]       Ph: [phone]       Fax: [phone]  [id number]   Current Allergies (reviewed today): ! CODEINE SULFATE (CODEINE SULFATE)

## 2010-06-22 NOTE — Progress Notes (Signed)
Summary: Rx Treximet  Phone Note Refill Request Call back at [phone] Message from:  [facility] on June 20, 2009 8:51 AM  Refills Requested: Medication #1:  TREXIMET 85-500 MG  TABS one tab by mouth at onset of h/a   Last Refilled: 09/05/2008 Initial call taken by: Emelia Salisbury LPN,  January 31, 624THL 8:52 AM    Prescriptions: TREXIMET 85-500 MG  TABS (SUMATRIPTAN-NAPROXEN SODIUM) one tab by mouth at onset of h/a, may repeat once in two hours.  #10 x 12   Entered and Authorized by:   Raenette Rover MD   Signed by:   Raenette Rover MD on 06/20/2009   Method used:   Electronically to        [facility] (retail)       [address] [place]       [place], [place]  [address]       Ph: [phone]       Fax: [phone]   RxID:   [id number]

## 2010-06-22 NOTE — Miscellaneous (Signed)
Summary: med list update  Clinical Lists Changes  Medications: Added new medication of ONETOUCH ULTRA BLUE  STRP (GLUCOSE BLOOD) check blood sugar twice a day Added new medication of ONETOUCH DELICA LANCETS  MISC (LANCETS) check blood sugar twice a day     Prior Medications: MULTIVITAMINS   TABS (MULTIPLE VITAMIN) as needed ADVIL 200 MG  TABS (IBUPROFEN) 3 by mouth every morning as needed INDOMETHACIN 50 MG  CAPS (INDOMETHACIN) one tab by mouth three times a day for two days then two times a day  as needed for gouty pain until pain stops TREXIMET 85-500 MG  TABS (SUMATRIPTAN-NAPROXEN SODIUM) one tab by mouth at onset of h/a, may repeat once in two hours. METOPROLOL SUCCINATE 100 MG XR24H-TAB (METOPROLOL SUCCINATE) one tab by mouth at night. LISINOPRIL 10 MG TABS (LISINOPRIL) Take one by mouth daily- held as of 10/11 ZYLOPRIM 300 MG TABS (ALLOPURINOL) one tab by mouth once daily ZYLOPRIM 100 MG TABS (ALLOPURINOL) one tab by mouth once daily SIMVASTATIN 40 MG TABS (SIMVASTATIN) one tab by mouth at night COLCHICINE 0.6 MG TABS (COLCHICINE) 1 tab by mouth two times a day as needed NIACIN 500 MG TABS (NIACIN) Take 2 by mouth daily DESOWEN 0.05 % LOTN (DESONIDE) apply to skin two times a day as needed, not on the face. Current Allergies: ! CODEINE SULFATE (CODEINE SULFATE)

## 2010-06-22 NOTE — Letter (Signed)
Summary: [facility] [facility]: Immunoassay Fecal Occult Blood (iFOB) Order Form  [place] at [facility]  [address] [place], [place] [address]   Phone: [phone]  Fax: [phone]      [facility]: Immunoassay Fecal Occult Blood (iFOB) Order Form   March 17, 2010 MRN: [id number]   Luis Joseph 01-02-1956   Physicican Name:___duncan______________________  Diagnosis Code:__________v76.49________________      Elsie Stain MD

## 2010-06-22 NOTE — Medication Information (Signed)
Summary: [employer] Prior Auth Approval for [employer] until 06/22/10  [employer] Prior Auth Approval for [employer] until 06/22/10   Imported By: Virgia Land 06/27/2009 10:43:12  _____________________________________________________________________  External Attachment:    Type:   Image     Comment:   External Document

## 2010-06-22 NOTE — Consult Note (Signed)
Summary: [facility]   Imported By: Edmonia James 05/23/2010 09:05:07  _____________________________________________________________________  External Attachment:    Type:   Image     Comment:   External Document

## 2010-06-22 NOTE — Progress Notes (Signed)
Summary: METOPROLOL SUCCINATE  Phone Note Refill Request Message from:  Patient on July 11, 2009 8:33 AM  Refills Requested: Medication #1:  METOPROLOL SUCCINATE 100 MG XR24H-TAB one tab by mouth at night. [facility] [address].   Initial call taken by: Lacretia Nicks,  July 11, 2009 8:34 AM Caller: Patient Call For: Raenette Rover MD    Prescriptions: METOPROLOL SUCCINATE 100 MG XR24H-TAB (METOPROLOL SUCCINATE) one tab by mouth at night.  #30 x 12   Entered by:   Emelia Salisbury LPN   Authorized by:   Raenette Rover MD   Signed by:   Emelia Salisbury LPN on [id number]   Method used:   Electronically to        [id number] (retail)       [address] [place]       [place], [place]  [address]       Ph: [phone]       Fax: [phone]   RxID:   [id number]

## 2010-06-22 NOTE — Progress Notes (Signed)
Summary: Rx Zyloprim  Phone Note Refill Request Call back at [phone] Message from:  [place] on August 15, 2009 12:33 PM  Refills Requested: Medication #1:  ZYLOPRIM 300 MG TABS one tab by mouth once daily Received e-scribe refill request from pharmacy. Patient states that he is on a 300 mg and 100mg  daily.   Method Requested: Electronic Initial call taken by: Emelia Salisbury LPN,  March 28, 624THL 12:34 PM    Prescriptions: ZYLOPRIM 100 MG TABS (ALLOPURINOL) one tab by mouth once daily  #30 x 12   Entered and Authorized by:   Raenette Rover MD   Signed by:   Raenette Rover MD on 08/15/2009   Method used:   Electronically to        [facility] (retail)       [place], [place], [place]  [address]       Ph: [phone]       Fax: [phone]   RxID:   [id number] MG TABS (ALLOPURINOL) one tab by mouth once daily  #30 x 12   Entered and Authorized by:   Raenette Rover MD   Signed by:   Raenette Rover MD on 08/15/2009   Method used:   Electronically to        [facility] (retail)       [place], [place]       [place], [place]  [address]       Ph: [phone]       Fax: [phone]   RxID:   [id number]

## 2010-06-22 NOTE — Consult Note (Signed)
Summary: [facility]   Imported By: Edmonia James 04/12/2010 08:59:20  _____________________________________________________________________  External Attachment:    Type:   Image     Comment:   External Document

## 2010-06-22 NOTE — Letter (Signed)
Summary: [facility] No Show Letter  [place] at [facility]  [address] [place], [place] [address]   Phone: [phone]  Fax: [phone]    06/30/2009 MRN: [id number]  CHAYNCE COMUNALE [address] [place], [place]  [address]   Dear Mr. DORRY,   [facility] records indicate that you missed your scheduled appointment with ____lab_________________ on _2.10.11__________.  Please contact this office to reschedule your appointment as soon as possible.  It is important that you keep your scheduled appointments with your physician, so we can provide you the best care possible.  Please be advised that there may be a charge for "no show" appointments.    Sincerely,   [place] at [facility]

## 2010-06-22 NOTE — Progress Notes (Signed)
Summary: Prior Authorization for Treximet 85-500mg   Phone Note Outgoing Call Call back at [phone]   Call placed by: Sherrian Divers CMA Deborra Medina),  June 21, 2009 10:24 AM Call placed to: [employer] Summary of Call: Received fax from [facility] [phone] stating that prior authorization is needed for Treximet 85-500mg .  [employer], spoke with Macky Lower., she is faxing paper work today. Initial call taken by: Sherrian Divers CMA Deborra Medina),  June 21, 2009 10:26 AM  Follow-up for Phone Call        Received authorization form, form in your IN box Follow-up by: Sherrian Divers CMA Deborra Medina),  June 22, 2009 9:01 AM  Additional Follow-up for Phone Call Additional follow up Details #1::        Done. Additional Follow-up by: Raenette Rover MD,  June 22, 2009 10:15 AM    Additional Follow-up for Phone Call Additional follow up Details #2::    Faxed to [employer].  Follow-up by: Emelia Salisbury LPN,  February  2, 624THL 11:40 AM   Appended Document: Prior Authorization for Treximet 85-500mg  Received prior authorization approval on Treximet.  Approved until 06/22/2010.  Pharmacy and patient notified.

## 2010-06-22 NOTE — Progress Notes (Signed)
----   Converted from flag ---- ---- 06/06/2010 1:48 PM, Elsie Stain MD wrote: please add on testosterone with dx fatigue 780.7. I talked to him and he was having some fatigue.  If it's low, we can set him up with uro referral.  He agrees.   ---- 06/06/2010 8:56 AM, Daralene Milch CMA (AAMA) wrote: Pt wants to have testosterone level checked, is it ok to add? If so what dx code could we use. Thanks Tasha ------------------------------

## 2010-06-22 NOTE — Assessment & Plan Note (Signed)
Summary: CPX/RBH DR [facility] PT/RBH   Vital Signs:  Patient profile:   55 year old male Height:      69 inches Weight:      277 pounds BMI:     41.05 Temp:     98.5 degrees F oral Pulse rate:   84 / minute Pulse rhythm:   regular BP sitting:   114 / 82  (left arm) Cuff size:   large  Vitals Entered By: Christena Deem CMA Deborra Medina) (March 17, 2010 8:40 AM) CC: CPX - Patient does NOT  intend to transfer care from [facility]   History of Present Illness: CPE- See prev med.   2 months ago, started having dry cough.  Frequent throat clearing.  Still going on now.  On ACE.    Hypertension:      Using medication without problems or lightheadedness: yes Chest pain with exertion:no Edema:no Short of breath:no Average home BPs:no   Elevated Cholesterol: Using medications without problems:yes Muscle aches: no Other complaints: no  Needs derm referral.  lesion on L side of face prev resected, now returned.   New dx DM2 based on labs.  H/o hyperglycemia prev.    Allergies: 1)  ! Codeine Sulfate (Codeine Sulfate)  Past History:  Past Medical History: Hypertension (05/21/1978) Hyperlipidemia (05/21/1988) Gout (05/21/1985) GERD (05/21/1978) H/o migraines, better after retirement Diabetes mellitus, type II  Past Surgical History: Partial Nephrectomy due to Cyst/stone removal (12/19/1977)  ETT [facility] due to Separation/Divorce  1993 ETT nml 05/27/1992  Family History: Reviewed history from 02/23/2009 and no changes required. Father dec 56 Lung Ca (smoker) Mother dec 6 days after CABG DM CAD(MI) Smoker Htn Brother A  Sister A  Obese DM back and leg pains CV:  (+) Mother, MGF died MI HBP:  (+) Mother, self DM:  (+) Mother, MGF CA:  (+) Father lung CA Stroke:  (-)  Social History: Reviewed history from 11/29/2006 and no changes required. Marital Status:   Re-Married 2002, lives with wife Children: 1 Daughter Occupation: retired from Administrator Former Smoker, quit 30 years ago  ~ '79 Alcohol use-no Drug use-no Enjoys motorcyles Exercise- "not enough" as of 02/2010  Review of Systems       H/o brief vision change during coughing episode during execise.  It happened after patient was taking OTC cough meds.  Self resolved.  No weakness, no slurred speech.  No return of symptoms.  See HPI.  Otherwise negative.    Physical Exam  General:  GEN: nad, alert and oriented, overweight HEENT: mucous membranes moist, nasal exam wnl, TM wnl bilaterally,  ~1cm slightly hyperpigmented fleshy papule on L side of face near TMJ NECK: supple w/o LA CV: rrr.  no murmur PULM: ctab, no inc wob ABD: soft, +bs EXT: no edema SKIN: no acute rash  Rectal:  No external abnormalities noted. Normal sphincter tone. No rectal masses or tenderness. Prostate:  Prostate gland firm and smooth, no enlargement, nodularity, tenderness, mass, asymmetry or induration.  Diabetes Management Exam:    Foot Exam (with socks and/or shoes not present):       Sensory-Pinprick/Light touch:          Left medial foot (L-4): normal          Left dorsal foot (L-5): normal          Left lateral foot (S-1): normal          Right medial foot (L-4): normal  Right dorsal foot (L-5): normal          Right lateral foot (S-1): normal       Sensory-Monofilament:          Left foot: normal          Right foot: normal       Inspection:          Left foot: normal          Right foot: normal       Nails:          Left foot: normal          Right foot: normal   Impression & Recommendations:  Problem # 1:  Preventive Health Care (ICD-V70.0) See prev med.   Problem # 2:  COUGH (ICD-786.2) Potentially ACE related.  Stop ACE and report back with symptoms.  Also potentially due to silent GERD.   Problem # 3:  HYPERLIPIDEMIA (B2193296.4) D/w patient about recent labs.  No change in meds.  Needs weight loss. His updated medication list for this problem includes:     Simvastatin 40 Mg Tabs (Simvastatin) ..... One tab by mouth at night    Niacin 500 Mg Tabs (Niacin) .Marland Kitchen... Take 2 by mouth daily  Problem # 4:  HYPERTENSION (ICD-401.9) Needs weight loss.  D/w patient re: ACE.  may need ARB in future. His updated medication list for this problem includes:    Metoprolol Succinate 100 Mg Xr24h-tab (Metoprolol succinate) ..... One tab by mouth at night.    Lisinopril 10 Mg Tabs (Lisinopril) .Marland Kitchen... Take one by mouth daily- held as of 10/11  Problem # 5:  SKIN LESION (ICD-709.9) Will refer to derm per patient request.  Orders: Dermatology Referral (Derma)  Problem # 6:  DM (ICD-250.00) New dx.  d/w patient re: path/phys and diet/weight.  Refer to DM2 education and have patient follow up for repeat labs.   His updated medication list for this problem includes:    Lisinopril 10 Mg Tabs (Lisinopril) .Marland Kitchen... Take one by mouth daily- held as of 10/11  Orders: Misc. Referral (Misc. Ref)  Complete Medication List: 1)  Multivitamins Tabs (Multiple vitamin) .... As needed 2)  Advil 200 Mg Tabs (Ibuprofen) .... 3 by mouth every morning as needed 3)  Indomethacin 50 Mg Caps (Indomethacin) .... One tab by mouth three times a day for two days then two times a day  as needed for gouty pain until pain stops 4)  Treximet 85-500 Mg Tabs (Sumatriptan-naproxen sodium) .... One tab by mouth at onset of h/a, may repeat once in two hours. 5)  Metoprolol Succinate 100 Mg Xr24h-tab (Metoprolol succinate) .... One tab by mouth at night. 6)  Lisinopril 10 Mg Tabs (Lisinopril) .... Take one by mouth daily- held as of 10/11 7)  Zyloprim 300 Mg Tabs (Allopurinol) .... One tab by mouth once daily 8)  Zyloprim 100 Mg Tabs (Allopurinol) .... One tab by mouth once daily 9)  Simvastatin 40 Mg Tabs (Simvastatin) .... One tab by mouth at night 10)  Colchicine 0.6 Mg Tabs (Colchicine) .Marland Kitchen.. 1 tab by mouth two times a day as needed 11)  Niacin 500 Mg Tabs (Niacin) .... Take 2 by mouth  daily 12)  Desowen 0.05 % Lotn (Desonide) .... Apply to skin two times a day as needed, not on the face.  Other Orders: Admin 1st Vaccine [phone]) Flu Vaccine 28yrs + [id number])  Patient Instructions: 1)  Stop taking the lisinopril for now.  Call me  in 2 weeks and let me know about the cough. 2)  See Rosaria Ferries about your referral before your leave today.   3)  I want to see you back in 3 months.  You'll need a 35min appointment.  Get labs done ahead of time.  Hgb A1c 250.00. 4)  Take care.  I am glad you came in today.    Orders Added: 1)  Est. Patient 40-64 years [99396] 2)  Est. Patient Level IV [id number] 3)  Admin 1st Vaccine [[id number]] 4)  Flu Vaccine 63yrs + [id number] 5)  Misc. Referral [Misc. Ref] 6)  Dermatology Referral [Derma]    Current Allergies (reviewed today): ! CODEINE SULFATE (CODEINE SULFATE)Flu Vaccine Consent Questions     Do you have a history of severe allergic reactions to this vaccine? no    Any prior history of allergic reactions to egg and/or gelatin? no    Do you have a sensitivity to the preservative Thimersol? no    Do you have a past history of Guillan-Barre Syndrome? no    Do you currently have an acute febrile illness? no    Have you ever had a severe reaction to latex? no    Vaccine information given and explained to patient? yes    Are you currently pregnant? no    Lot Number:[id number]   Exp Date:11/18/2010   Site Given  Left Deltoid IMes (reviewed today): ! CODEINE SULFATE (CODEINE SULFATE)        .lbflu   Appended Document: CPX/RBH DR [facility] PT/RBH    Clinical Lists Changes  Observations: Added new observation of CHIEF CMPLNT: Preventive Care (03/17/2010 17:12) Added new observation of HTN PROGRESS: Deteriorated (03/17/2010 17:12) Added new observation of HTN FSREVIEW: Yes (03/17/2010 17:12) Added new observation of LIPID PROGRS: At goal (03/17/2010 17:12) Added new observation of LIPID FSREVW: Yes (03/17/2010 17:12) Added new  observation of DM FSREVIEW: Yes (03/17/2010 17:12) Added new observation of COLONRECACT: patient refused (03/17/2010 17:12) Added new observation of HEMOCCRECACT: NEG X 1 today; Given X 3 (03/17/2010 17:12)       Prevention & Chronic Care Immunizations   Influenza vaccine: Fluvax 3+  (03/17/2010)    Tetanus booster: 08/19/2001: Td    Pneumococcal vaccine: Not documented  Colorectal Screening   Hemoccult: Not documented   Hemoccult action/deferral: NEG X 1 today; Given X 3  (03/17/2010)    Colonoscopy: Not documented   Colonoscopy action/deferral: patient refused  (03/17/2010)  Other Screening   PSA: 0.57  (10/13/2009)   Smoking status: quit  (02/23/2009)  Diabetes Mellitus   HgbA1C: 8.4  (03/13/2010)    Eye exam: Not documented    Foot exam: yes  (03/17/2010)   High risk foot: Not documented   Foot care education: Not documented    Urine microalbumin/creatinine ratio: 77.2  (03/13/2010)    Diabetes flowsheet reviewed?: Yes  Lipids   Total Cholesterol: 153  (10/13/2009)   LDL: 71  (10/13/2009)   LDL Direct: 184.3  (02/23/2009)   HDL: 43.00  (10/13/2009)   Triglycerides: 193.0  (10/13/2009)    SGOT (AST): 39  (10/13/2009)   SGPT (ALT): 36  (10/13/2009)   Alkaline phosphatase: 82  (10/13/2009)   Total bilirubin: 0.7  (10/13/2009)    Lipid flowsheet reviewed?: Yes   Progress toward LDL goal: At goal  Hypertension   Last Blood Pressure: 114 / 82  (03/17/2010)   Serum creatinine: 1.4  (10/13/2009)   Serum potassium 4.8  (10/13/2009)    Hypertension flowsheet reviewed?: Yes  Progress toward BP goal: Deteriorated  Self-Management Support :    Diabetes self-management support: Not documented    Hypertension self-management support: Not documented    Lipid self-management support: Not documented     Colorectal Screening:  Current Recommendations:    Hemoccult: NEG X 1 today; Given X 3    Colonoscopy recommended: patient refused  PSA  Screening:    PSA: 0.57  (10/13/2009)  Immunization & Chemoprophylaxis:    Tetanus vaccine: Td  (08/19/2001)    Influenza vaccine: Fluvax 3+  (03/17/2010)

## 2010-06-22 NOTE — Letter (Signed)
Summary: Results Follow up Letter  [place] at [facility]  [address] [place], [place] [address]   Phone: [phone]  Fax: [phone]    03/27/2010 MRN: [id number]    [place], [place]  [address]    Dear Mr. CROPSEY,  The following are the results of your recent test(s):  Test         Result    Pap Smear:        Normal _____  Not Normal _____ Comments: ______________________________________________________ Cholesterol: LDL(Bad cholesterol):         Your goal is less than:         HDL (Good cholesterol):       Your goal is more than: Comments:  ______________________________________________________ Mammogram:        Normal _____  Not Normal _____ Comments:  ___________________________________________________________________ Hemoccult:        Normal __X___  Not normal _______ Comments:  Yearly follow up is recommended.   _____________________________________________________________________ Other Tests:    We routinely do not discuss normal results over the telephone.  If you desire a copy of the results, or you have any questions about this information we can discuss them at your next office visit.   Sincerely,    [place] Rising. Damita Dunnings, M.D.  [facility]

## 2010-06-22 NOTE — Assessment & Plan Note (Signed)
Summary: FOLLOW UP/RBH   Vital Signs:  Patient profile:   55 year old male Weight:      279.25 pounds BMI:     41.39 Temp:     99.0 degrees F oral Pulse rate:   76 / minute BP sitting:   128 / 88  (left arm) Cuff size:   large  Vitals Entered By: Emelia Salisbury LPN (June  2, [id number] AM) CC: Follow-up after labs   History of Present Illness: Pt here for 6 month followup. He is doing well and complains only of distal joints of hands bothering him. His gout is well ciontrolled,. He is taperiong slowly off Colchicine. He otherwise is spending most of his time at his place at [facility].  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 As Needed 2)  Advil 200 Mg  Tabs (Ibuprofen) .... 3 By Mouth Every Morning As Needed 3)  Indomethacin 50 Mg  Caps (Indomethacin) .... One Tab By Mouth Three Times A Day For Two Days Then Two Times A Day  As Needed For Gouty Pain Until Pain Stops 4)  Treximet 85-500 Mg  Tabs (Sumatriptan-Naproxen Sodium) .... One Tab By Mouth At Onset of H/a, May Repeat Once in Two Hours. 5)  Metoprolol Succinate 100 Mg Xr24h-Tab (Metoprolol Succinate) .... One Tab By Mouth At Night. 6)  Lisinopril 10 Mg Tabs (Lisinopril) .... Take One By Mouth Daily 7)  Zyloprim 300 Mg Tabs (Allopurinol) .... One Tab By Mouth Once Daily 8)  Zyloprim 100 Mg Tabs (Allopurinol) .... One Tab By Mouth Once Daily 9)  Simvastatin 40 Mg Tabs (Simvastatin) .... One Tab By Mouth  At Night 10)  Colchicine 0.6 Mg Tabs (Colchicine) .Marland Kitchen.. 1 Tab By Mouth Bid  Allergies: 1)  ! Codeine Sulfate (Codeine Sulfate)  Physical Exam  General:  Well-developed,well-nourished,in no acute distress; alert,appropriate and cooperative throughout examination, slightly overweight. Head:  Normocephalic and atraumatic without obvious abnormalities. No apparent alopecia or balding. Eyes:  Conjunctiva clear bilaterally Ears:  External ear exam shows no significant lesions or deformities.  Otoscopic examination reveals clear canals, tympanic membranes are intact bilaterally without bulging, retraction, inflammation or discharge. Hearing is grossly normal bilaterally. Nose:  External nasal examination shows no deformity or inflammation. Nasal mucosa are pink and moist without lesions or exudates. Mouth:  Oral mucosa and oropharynx without lesions or exudates.  Teeth in good repair. Lungs:  Normal respiratory effort, chest expands symmetrically. Lungs are clear to auscultation, no crackles or wheezes. Heart:  Normal rate (altho elevated today over nml)  and regular rhythm. S1 and S2 normal without gallop, murmur, click, rub or other extra sounds. Skin:  Intact without suspicious lesions or rashes, mildly sunburned.   Impression & Recommendations:  Problem # 1:  HYPERGLYCEMIA (ICD-790.29) Assessment New  Discussed avoiding sweets and carbs, Had banan pudding the night before. Will recheck in the future to [facility]. Encouraged to avoid sweets and carbs and discussed quickly.  Labs Reviewed: Creat: 1.4 (10/13/2009)  Problem # 2:  GOUT (ICD-274.9) Assessment: Improved  Uric acid nml. Stop Colchicine. His updated medication list for this problem includes:    Advil 200 Mg Tabs (Ibuprofen) .Marland KitchenMarland KitchenMarland KitchenMarland Kitchen 3 by mouth every morning as needed    Indomethacin 50 Mg Caps (Indomethacin) ..... One tab by mouth three times a day for two days then two times a day  as needed for gouty pain  until pain stops    Zyloprim 300 Mg Tabs (Allopurinol) ..... One tab by mouth once daily    Zyloprim 100 Mg Tabs (Allopurinol) ..... One tab by mouth once daily    Colchicine 0.6 Mg Tabs (Colchicine) .Marland Kitchen... 1 tab by mouth two times a day  Elevate extremity; warm compresses, symptomatic relief and medication as directed.   Problem # 3:  HYPERLIPIDEMIA (P102836.4) Assessment: Unchanged  Trig slightly elevated. Sweets and carbs!! Cont other meds. His updated medication list for this problem includes:    Simvastatin 40 Mg Tabs (Simvastatin) ..... One tab by mouth at night    Niacin 500 Mg Tabs (Niacin) .Marland Kitchen... Take 2 by mouth daily  Labs Reviewed: SGOT: 39 (10/13/2009)   SGPT: 36 (10/13/2009)  Lipid Goals: Chol Goal: 200 (11/09/2008)   HDL Goal: 40 (11/09/2008)   LDL Goal: 130 (11/09/2008)   TG Goal: 150 (11/09/2008)  Prior 10 Yr Risk Heart Disease: Not enough information (11/09/2008)   HDL:43.00 (10/13/2009), 43.90 (05/30/2009)  LDL:71 (10/13/2009), 69 (05/30/2009)  Chol:153 (10/13/2009), 134 (05/30/2009)  Trig:193.0 (10/13/2009), 108.0 (05/30/2009)  Problem # 4:  HYPERTENSION (ICD-401.9) Assessment: Unchanged  His updated medication list for this problem includes:    Metoprolol Succinate 100 Mg Xr24h-tab (Metoprolol succinate) ..... One tab by mouth at night.    Lisinopril 10 Mg Tabs (Lisinopril) .Marland Kitchen... 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 Avoid the face. His updated medication list for this problem includes:    Desowen 0.05 % Lotn (Desonide) .Marland Kitchen... Apply to skin two times a day as needed, not on the  face.  Complete Medication List: 1)  Multivitamins Tabs (Multiple vitamin) .... As needed 2)  Advil 200 Mg Tabs (Ibuprofen) .... 3 by mouth every morning as needed 3)  Indomethacin 50 Mg Caps (Indomethacin) .... One tab by mouth three times a day for two days then two times a day  as needed for gouty pain until pain stops 4)  Treximet 85-500 Mg Tabs (Sumatriptan-naproxen sodium) .... One tab by mouth at onset of h/a, may repeat once in two hours. 5)  Metoprolol Succinate 100 Mg Xr24h-tab (Metoprolol succinate) .... One tab by mouth at night. 6)  Lisinopril 10 Mg Tabs (Lisinopril) .... Take one by mouth daily 7)  Zyloprim 300 Mg Tabs (Allopurinol) .... One tab by mouth once daily 8)  Zyloprim 100 Mg Tabs (Allopurinol) .... One tab by mouth once daily 9)  Simvastatin 40 Mg Tabs (Simvastatin) .... One tab by mouth at night 10)  Colchicine 0.6 Mg Tabs (Colchicine) .Marland Kitchen.. 1 tab by mouth two times a day 11)  Niacin 500 Mg Tabs (Niacin) .... Take 2 by mouth daily 12)  Desowen 0.05 % Lotn (Desonide) .... Apply to skin  two times a day as needed, not on the face.  Patient Instructions: 1)  RTC with Dr Damita Dunnings, whenever, 2)  FBS and A1C and Microalb, 790.29 and Uric Acid 274.9 Prescriptions: DESOWEN 0.05 % LOTN (DESONIDE) apply to skin two times a day as needed, not on the face.  #1 bottle x 6   Entered and Authorized by:   Raenette Rover MD   Signed by:   Raenette Rover MD on 10/20/2009   Method used:   Electronically to        [id number] [place] (retail)       [address] [place], [place]  [address]       Ph: [phone]       Fax: [phone]   RxID:   [id number]   Current Allergies (reviewed today): ! CODEINE SULFATE ([place])

## 2010-06-22 NOTE — Progress Notes (Signed)
Summary: refill requests from [facility]  Phone Note Refill Request Message from:  Fax from Pharmacy  Refills Requested: Medication #1:  INDOMETHACIN 50 MG  CAPS one tab by mouth three times a day for two days then two times a day  as needed for gouty pain until pain stops  Medication #2:  allopurinol 300 mg's  Medication #3:  allopurinol 100 mg's Faxed forms from [facility], allopurinol is not on med list.  Initial call taken by: Marty Heck CMA,  Oct 13, 2009 11:54 AM  Follow-up for Phone Call        I'm sorry. Pls call [facility] and cancel. Zyloprim is generic Allopurinol. Raenette Rover MD  Oct 13, 2009 1:52 PM   Additional Follow-up for Phone Call Additional follow up Details #1::        Scripts faxed to [facility], cancelled at [place]. Additional Follow-up by: Marty Heck CMA,  Oct 13, 2009 3:19 PM    Prescriptions: ZYLOPRIM 100 MG TABS (ALLOPURINOL) one tab by mouth once daily  #90 x 4   Entered and Authorized by:   Raenette Rover MD   Signed by:   Raenette Rover MD on 10/13/2009   Method used:   Printed then faxed to ...       [facility] (retail)       [place], [place]  [address]       Ph: [phone]       Fax: [phone]   RxID:   [id number] MG TABS (ALLOPURINOL) one tab by mouth once daily  #90 x 4   Entered and Authorized by:   Raenette Rover MD   Signed by:   Raenette Rover MD on 10/13/2009   Method used:   Printed then faxed to ...       [id number] (retail)       [place], [place]  [address]       Ph: [phone]       Fax: [phone]   RxID:   [id number] INDOMETHACIN 50 MG  CAPS (INDOMETHACIN) one tab by mouth three times a day for two days then two times a day  as needed for gouty pain until pain stops  #30 x 4   Entered and Authorized by:   Raenette Rover MD   Signed by:   Raenette Rover MD on 10/13/2009   Method used:   Printed then faxed to ...       [facility] (retail)       [place], [place]  [address]       Ph: [phone]       Fax: [phone]   RxID:   [id number] ZYLOPRIM 100 MG TABS (ALLOPURINOL) one tab by mouth once daily  #90 x 4   Entered and Authorized by:   Raenette Rover MD   Signed by:   Raenette Rover MD on 10/13/2009   Method used:   Electronically to        [facility] (retail)       [address]  [place], [place]  [address]       Ph: [phone]       Fax: [phone]   RxID:   [id number] MG TABS (ALLOPURINOL) one tab by mouth once daily  #90 x 4   Entered and Authorized by:   Raenette Rover MD   Signed by:   Raenette Rover MD on 10/13/2009   Method used:   Electronically to        [facility] (retail)       [place], [place], [place]  [address]       Ph: [phone]       Fax: [phone]   RxID:   [id number] INDOMETHACIN 50 MG  CAPS (INDOMETHACIN) one tab by mouth three times a day for two days then two times a day  as needed for gouty pain until pain stops  #30 x 4   Entered and Authorized by:   Raenette Rover MD   Signed by:   Raenette Rover MD on 10/13/2009   Method used:   Electronically to        [facility] [place] (retail)       [place], [address] [place]       [place], [place]  [address]       Ph: [phone]       Fax: [phone]   RxID:   [id number]

## 2010-06-22 NOTE — Letter (Signed)
Summary: Anabel Halon letter  [place] at [facility]  [address] [place], [place] [address]   Phone: [phone]  Fax: [phone]       12/27/2009 MRN: [id number]  [place] [address] [place]  [address]  Dear Mr. SIFUENTEZ,  [place], and [place] announce the retirement of Modesto Charon, M.D., from full-time practice at the [facility] office effective November 17, 2009 and his plans of returning part-time.  It is important to Dr. Council Mechanic and to our practice that you understand that [facility] has seven physicians in our office for your health care needs.  We will continue to offer the same exceptional care that you have today.    Dr. Council Mechanic has spoken to many of you about his plans for retirement and returning part-time in the fall.   We will continue to work with you through the transition to schedule appointments for you in the office and meet the high standards that [employer] is committed to.   Again, it is with great pleasure that we share the news that Dr. Council Mechanic will return to [facility] in October of 2011 with a reduced schedule.    If you have any questions, or would like to request an appointment with one of our physicians, please call us at [phone] and press the option for Scheduling an appointment.  We take pleasure in providing you with excellent patient care and look forward to seeing you at your next office visit.  [facility] are:  Viviana Simpler, M.D. Teresa Pelton, M.D. Loura Pardon, M.D. Eliezer Lofts, M.D. Owens Loffler, M.D. Arnette Norris, M.D. We proudly welcomed Renford Dills, M.D. and Ria Bush, M.D. to the practice in July/August 2011.  Sincerely,  [facility]

## 2010-06-22 NOTE — Progress Notes (Signed)
Summary: Rx Desonide   Phone Note Refill Request Call back at [phone] Message from:  [facility] on May 01, 2010 11:12 AM  Refills Requested: Medication #1:  DESOWEN 0.05 % LOTN apply to skin two times a day as needed  Method Requested: Electronic Initial call taken by: Emelia Salisbury LPN,  December 12, 624THL 11:12 AM  Follow-up for Phone Call        sent.  Follow-up by: Elsie Stain MD,  May 01, 2010 1:59 PM    Prescriptions: DESOWEN 0.05 % LOTN (DESONIDE) apply to skin two times a day as needed, not on the face.  #1 bottle x 6   Entered and Authorized by:   Elsie Stain MD   Signed by:   Elsie Stain MD on 05/01/2010   Method used:   Electronically to        [id number] (retail)       [address], [place]       [place], [place]  [address]       Ph: [phone]       Fax: [phone]   RxID:   [id number]

## 2010-06-22 NOTE — Assessment & Plan Note (Signed)
Summary: ROA 3 MTHS CYD   Vital Signs:  Patient profile:   55 year old male Height:      69 inches Weight:      260.25 pounds BMI:     38.57 Temp:     98.1 degrees F oral Pulse rate:   80 / minute Pulse rhythm:   regular BP sitting:   130 / 76  (left arm) Cuff size:   large  Vitals Entered By: Christena Deem CMA ([place]) (June 09, 2010 8:21 AM) CC: 3 months follow up   History of Present Illness: Diabetes:  no meds Hypoglycemic episodes: no Hyperglycemic episodes:no Feet problems:no Blood Sugars averaging: AM 120's He went to education classes and is doing well with diet and exercise.    Testosterone level was checked and was normal.    Allergies: 1)  ! Codeine Sulfate (Codeine Sulfate)  Past History:  Past Medical History: Hypertension (05/21/1978) Hyperlipidemia (05/21/1988) Gout (05/21/1985) GERD (05/21/1978) H/o migraines, better after retirement Diabetes mellitus, type II- diet controlled  Social History: Marital Status:   Re-Married 2002, lives with wife Children: 1 Daughter Occupation: retired from [employer] Former Smoker, quit 30 years ago  ~ '79 Alcohol use-no Drug use-no Enjoys motorcyles Exercise- yes and regular after dx of DM2  Review of Systems       See HPI.  Otherwise negative.    Physical Exam  General:  no apparent distress normocephalic atraumatic mucous membranes moist neck supple regular rate and rhythm clear to auscultation bilaterally   Diabetes Management Exam:    Foot Exam (with socks and/or shoes not present):       Sensory-Pinprick/Light touch:          Left medial foot (L-4): normal          Left dorsal foot (L-5): normal          Left lateral foot (S-1): normal          Right medial foot (L-4): normal          Right dorsal foot (L-5): normal          Right lateral foot (S-1): normal       Sensory-Monofilament:          Left foot: normal          Right foot: normal       Inspection:  Left foot: normal          Right foot: normal       Nails:          Left foot: normal          Right foot: normal   Impression & Recommendations:  Problem # 1:  DIABETES MELLITUS, TYPE II (ICD-250.00) diet controlled. may add back ACE later this year.  Cont work on diet and exercise to lose weight.  He is doing well and I am very happy about that.  See plan. If he continues to lose weight, we may be able to decrease is allopurinol.  His HAs are improved on the BB and with weight loss.  I didn't want to decrease the BB today and risk an increase in HA.  He agrees with plan.  His updated medication list for this problem includes:    Lisinopril 10 Mg Tabs (Lisinopril) .Marland Kitchen... Take one by mouth daily- held as of 10/11  Complete Medication List: 1)  Multivitamins Tabs (Multiple vitamin) .... As needed 2)  Advil 200 Mg Tabs (Ibuprofen) .... 3 by  mouth every morning as needed 3)  Indomethacin 50 Mg Caps (Indomethacin) .... One tab by mouth three times a day for two days then two times a day  as needed for gouty pain until pain stops 4)  Treximet 85-500 Mg Tabs (Sumatriptan-naproxen sodium) .... One tab by mouth at onset of h/a, may repeat once in two hours. 5)  Metoprolol Succinate 100 Mg Xr24h-tab (Metoprolol succinate) .... One tab by mouth at night. 6)  Lisinopril 10 Mg Tabs (Lisinopril) .... Take one by mouth daily- held as of 10/11 7)  Zyloprim 300 Mg Tabs (Allopurinol) .... One tab by mouth once daily 8)  Zyloprim 100 Mg Tabs (Allopurinol) .... One tab by mouth once daily 9)  Simvastatin 40 Mg Tabs (Simvastatin) .... One tab by mouth at night 10)  Colchicine 0.6 Mg Tabs (Colchicine) .Marland Kitchen.. 1 tab by mouth two times a day as needed 11)  Niacin 500 Mg Tabs (Niacin) .... Take 2 by mouth daily 12)  Desowen 0.05 % Lotn (Desonide) .... Apply to skin two times a day as needed, not on the face. 13)  Onetouch Ultra Blue Strp (Glucose blood) .... Check blood sugar twice a day 14)  Onetouch Delica Lancets  Misc (Lancets) .... Check blood sugar twice a day  Patient Instructions: 1)  recheck A1c and lipids in 3 months.  fasting.  250.00 2)  OV a few days later.  30min.  3)  Keep working on your diet and exercise.  I'm really glad you've been working so hard on both.     Orders Added: 1)  Est. Patient Level III [id number]    Current Allergies (reviewed today): ! CODEINE SULFATE (CODEINE SULFATE)

## 2010-07-19 ENCOUNTER — Encounter: Payer: Self-pay | Admitting: Family Medicine

## 2010-09-01 ENCOUNTER — Other Ambulatory Visit: Payer: Self-pay | Admitting: Family Medicine

## 2010-09-01 DIAGNOSIS — E785 Hyperlipidemia, unspecified: Secondary | ICD-10-CM

## 2010-09-01 DIAGNOSIS — R7309 Other abnormal glucose: Secondary | ICD-10-CM

## 2010-09-05 ENCOUNTER — Other Ambulatory Visit: Payer: Self-pay

## 2010-09-08 ENCOUNTER — Ambulatory Visit: Payer: Self-pay | Admitting: Family Medicine

## 2010-10-06 ENCOUNTER — Emergency Department (HOSPITAL_COMMUNITY): Admission: EM | Admit: 2010-10-06 | Payer: BC Managed Care – PPO | Source: Home / Self Care

## 2010-10-28 ENCOUNTER — Other Ambulatory Visit: Payer: Self-pay | Admitting: Family Medicine

## 2011-02-02 ENCOUNTER — Other Ambulatory Visit: Payer: Self-pay | Admitting: Family Medicine

## 2011-02-04 NOTE — Telephone Encounter (Signed)
Will refill electronically  

## 2011-02-06 ENCOUNTER — Telehealth: Payer: Self-pay | Admitting: *Deleted

## 2011-02-06 NOTE — Telephone Encounter (Signed)
Prior auth needed for treximet, form is on your desk.

## 2011-02-07 NOTE — Telephone Encounter (Signed)
Form faxed back to [facility]. 

## 2011-02-07 NOTE — Telephone Encounter (Signed)
Form signed. Pt is Dr Josefine Class pt.

## 2011-02-08 NOTE — Telephone Encounter (Signed)
Prior auth given for treximet, approval letter placed on doctor's desk for signature and scanning.

## 2011-07-24 ENCOUNTER — Encounter: Payer: Self-pay | Admitting: Family Medicine

## 2011-07-24 ENCOUNTER — Ambulatory Visit (INDEPENDENT_AMBULATORY_CARE_PROVIDER_SITE_OTHER): Payer: PRIVATE HEALTH INSURANCE | Admitting: Family Medicine

## 2011-07-24 VITALS — BP 132/92 | HR 74 | Temp 98.3°F | Wt 278.0 lb

## 2011-07-24 DIAGNOSIS — R0982 Postnasal drip: Secondary | ICD-10-CM

## 2011-07-24 DIAGNOSIS — R252 Cramp and spasm: Secondary | ICD-10-CM

## 2011-07-24 DIAGNOSIS — E119 Type 2 diabetes mellitus without complications: Secondary | ICD-10-CM

## 2011-07-24 DIAGNOSIS — N529 Male erectile dysfunction, unspecified: Secondary | ICD-10-CM

## 2011-07-24 DIAGNOSIS — R42 Dizziness and giddiness: Secondary | ICD-10-CM

## 2011-07-24 MED ORDER — VARDENAFIL HCL 20 MG PO TABS: 10.0000 mg | ORAL_TABLET | Freq: Every day | ORAL | Status: DC | PRN

## 2011-07-24 MED ORDER — FLUTICASONE PROPIONATE 50 MCG/ACT NA SUSP: 2.0000 | Freq: Every day | NASAL | Status: DC

## 2011-07-24 NOTE — Patient Instructions (Addendum)
Come back for a diabetes check in about 3 months.  30 min visit.  Fasting labs ahead of time.  Try the levitra and let me know if you have flushing.  Try the bedside exercises for the dizzy feeling.  Use the flonase daily for the postnasal drip.  Glad to see you.

## 2011-07-24 NOTE — Progress Notes (Signed)
2 weeks ago, he was dizzy when rolling from one side to the other in bed.  2 days ago it was worse after getting out of the bed.  Similar yesterday.  Much better today.  No speech changes.  No focal neuro changes.  Room spinning, no like passing out/presyncope. No other sx like this prev.  No med changes.  Not lightly headed on standing.  Dizzy if leaning over.   Postnasal gtt.  Clear rhinorrhea.  It triggers a cough.  Allegra helps very little with post nasal gtt, but helps some with the eye watering.  Failed other otc antihistamines  Leg cramps, about every few weeks.  Nocturnal.  No other cramps o/w.  Taking a MVI and simvastatin.   ED.  Sex drive is good, but but dec in erections.  Rare spontaneous AM erection.  Wife is supportive.  Viagra made him flush prev.  Asking about options.   Meds, vitals, and allergies reviewed.   ROS: See HPI.  Otherwise, noncontributory. GEN: nad, alert and oriented HEENT: mucous membranes moist, tm wnl, clear rhinorrhea noted NECK: supple w/o LA CV: rrr PULM: ctab, no inc wob ABD: soft, +bs EXT: no edema SKIN: no acute rash CN 2-12 wnl B, S/S/DTR wnl x4 DHP positive

## 2011-07-25 ENCOUNTER — Other Ambulatory Visit: Payer: Self-pay | Admitting: *Deleted

## 2011-07-25 ENCOUNTER — Encounter: Payer: Self-pay | Admitting: Family Medicine

## 2011-07-25 DIAGNOSIS — R252 Cramp and spasm: Secondary | ICD-10-CM | POA: Insufficient documentation

## 2011-07-25 DIAGNOSIS — R42 Dizziness and giddiness: Secondary | ICD-10-CM | POA: Insufficient documentation

## 2011-07-25 DIAGNOSIS — R0982 Postnasal drip: Secondary | ICD-10-CM | POA: Insufficient documentation

## 2011-07-25 DIAGNOSIS — N529 Male erectile dysfunction, unspecified: Secondary | ICD-10-CM | POA: Insufficient documentation

## 2011-07-25 MED ORDER — ALLOPURINOL 100 MG PO TABS: 100.0000 mg | ORAL_TABLET | Freq: Every day | ORAL | Status: DC

## 2011-07-25 MED ORDER — SIMVASTATIN 40 MG PO TABS: 40.0000 mg | ORAL_TABLET | Freq: Every day | ORAL | Status: DC

## 2011-07-25 MED ORDER — METOPROLOL SUCCINATE ER 100 MG PO TB24: 100.0000 mg | ORAL_TABLET | Freq: Every day | ORAL | Status: DC

## 2011-07-25 MED ORDER — ALLOPURINOL 300 MG PO TABS: 300.0000 mg | ORAL_TABLET | Freq: Every day | ORAL | Status: DC

## 2011-07-25 NOTE — Assessment & Plan Note (Signed)
Try levitra as it is shorter acting than viagra, in case of flushing.  F/u prn.

## 2011-07-25 NOTE — Assessment & Plan Note (Signed)
Likely allergic or irritant.  Add on flonase, f/u prn.

## 2011-07-25 NOTE — Assessment & Plan Note (Signed)
Likely BPV, path/phys d/w pt.  Use bedside exercise and f/u prn.

## 2011-07-25 NOTE — Assessment & Plan Note (Signed)
D/w pt about stretching.

## 2011-09-10 ENCOUNTER — Ambulatory Visit (INDEPENDENT_AMBULATORY_CARE_PROVIDER_SITE_OTHER): Payer: PRIVATE HEALTH INSURANCE | Admitting: Family Medicine

## 2011-09-10 VITALS — BP 153/104 | HR 72 | Temp 98.0°F | Resp 18 | Ht 70.25 in | Wt 280.0 lb

## 2011-09-10 DIAGNOSIS — R0982 Postnasal drip: Secondary | ICD-10-CM

## 2011-09-10 DIAGNOSIS — R05 Cough: Secondary | ICD-10-CM

## 2011-09-10 DIAGNOSIS — R059 Cough, unspecified: Secondary | ICD-10-CM

## 2011-09-10 DIAGNOSIS — R062 Wheezing: Secondary | ICD-10-CM

## 2011-09-10 MED ORDER — ALBUTEROL SULFATE HFA 108 (90 BASE) MCG/ACT IN AERS: 2.0000 | INHALATION_SPRAY | Freq: Four times a day (QID) | RESPIRATORY_TRACT | Status: DC | PRN

## 2011-09-10 MED ORDER — IPRATROPIUM BROMIDE 0.03 % NA SOLN: 2.0000 | Freq: Four times a day (QID) | NASAL | Status: DC

## 2011-09-10 MED ORDER — DOXYCYCLINE HYCLATE 100 MG PO CAPS: 100.0000 mg | ORAL_CAPSULE | Freq: Two times a day (BID) | ORAL | Status: DC

## 2011-09-10 NOTE — Progress Notes (Signed)
Patient Name: Luis Joseph Date of Birth: April 28, 1956 Medical Record Number: [id number] Gender: male Date of Encounter: 09/10/2011  History of Present Illness:  Luis Joseph is a 56 y.o. very pleasant male patient who presents with the following:  Here today with cough, wheezing- can have cough "attacks" that cause him to nearly pass out.  Coughs and wheezes all night, PND.  No fever that he has noted. No ST, no stuffy nose.  Has felt a little nauseated a couple of times but no vomiitng.   PCP is at [facility].  No history of glaucoma or prostate issues Patient Active Problem List  Diagnoses  . Type II or unspecified type diabetes mellitus without mention of complication, not stated as uncontrolled  . HYPERLIPIDEMIA  . GOUT  . OBESITY, UNSPECIFIED  . ANXIETY  . HEADACHE, CLUSTER  . HYPERTENSION  . GERD  . DERMATITIS  . SKIN LESION  . UNSPECIFIED SLEEP DISTURBANCE  . COUGH  . HYPERGLYCEMIA  . FASTING HYPERGLYCEMIA  . FATIGUE  . Erectile dysfunction  . Postnasal drip  . Muscle cramp, nocturnal  . Vertigo   Past Medical History  Diagnosis Date  . Hyperlipidemia 05/21/1988  . Hypertension 05/21/1978  . Gout 05/21/1985  . GERD (gastroesophageal reflux disease) 05/21/1978  . Migraines     Better after retirement  . Diabetes mellitus     Type II, diet controlled  . Chest pain 1993    Due to separation/divorce   Past Surgical History  Procedure Date  . 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 Smoking status: Former Research scientist (life sciences)  . Smokeless tobacco: Former Systems developer    Quit date: 05/21/1977  . Alcohol Use: No   Family History  Problem Relation Age of Onset  . Heart disease Mother   . Diabetes Mother   . Hypertension Mother   . Cancer Father     Lung  . Diabetes Sister   . Heart disease Maternal Grandfather   . Diabetes Maternal Grandfather   . Stroke Neg Hx    Allergies  Allergen Reactions  . Codeine Sulfate       REACTION: Swelling, hives  . Viagra     flushing    Medication list has been reviewed and updated.  Review of Systems: As per HPI- otherwise negative.   Physical Examination: Filed Vitals:   09/10/11 1514  BP: 153/104  Pulse: 72  Temp: 98 F (36.7 C)  TempSrc: Oral  Resp: 18  Height: 5' 10.25" (1.784 m)  Weight: 280 lb (127.007 kg)  SpO2: 98%    Body mass index is 39.89 kg/(m^2).  GEN: WDWN, NAD, Non-toxic, A & O x 3, obese HEENT: Atraumatic, Normocephalic. Neck supple. No masses, No LAD.  TM, oropharynx wnl, sinuses wnl, nasal cavity congested Ears and Nose: No external deformity. CV: RRR, No M/G/R. No JVD. No thrill. No extra heart sounds. PULM: CTA B, no wheezes, crackles, rhonchi. No retractions. No resp. distress. No accessory muscle use. EXTR: No c/c/e NEURO Normal gait.  PSYCH: Normally interactive. Conversant. Not depressed or anxious appearing.  Calm demeanor.    Assessment and Plan: 1. Cough  doxycycline (VIBRAMYCIN) 100 MG capsule  2. Wheezing  albuterol (PROVENTIL HFA;VENTOLIN HFA) 108 (90 BASE) MCG/ACT inhaler  3. Post-nasal drip  ipratropium (ATROVENT) 0.03 % nasal spray   Will treat for bronchitis, wheezing and PND as above.  Patient (or parent if minor) instructed to return to clinic  or call if not better in 2-3 day(s).  Sooner if worse.

## 2011-09-11 ENCOUNTER — Telehealth: Payer: Self-pay

## 2011-09-11 MED ORDER — DIPHENHYD-HYDROCORT-NYSTATIN MT SUSP: 5.0000 mL | Freq: Four times a day (QID) | OROMUCOSAL | Status: DC | PRN

## 2011-09-11 NOTE — Telephone Encounter (Signed)
Pt calling back with new symptom, throat is very sore,painful,please call meds in for same.   Best phone [phone]   Pharmacy [facility] [address] in [place]

## 2011-09-11 NOTE — Telephone Encounter (Signed)
Done and sent in. Luis Joseph

## 2011-09-11 NOTE — Telephone Encounter (Signed)
Spoke with patient and let him know that [facility].

## 2011-09-11 NOTE — Telephone Encounter (Signed)
Please advise on this.  Patient was seen yesturday.

## 2011-09-14 ENCOUNTER — Ambulatory Visit (INDEPENDENT_AMBULATORY_CARE_PROVIDER_SITE_OTHER): Payer: PRIVATE HEALTH INSURANCE | Admitting: Family Medicine

## 2011-09-14 ENCOUNTER — Ambulatory Visit: Payer: PRIVATE HEALTH INSURANCE

## 2011-09-14 VITALS — BP 152/92 | HR 71 | Temp 98.4°F | Resp 18 | Ht 70.0 in | Wt 276.0 lb

## 2011-09-14 DIAGNOSIS — R059 Cough, unspecified: Secondary | ICD-10-CM

## 2011-09-14 DIAGNOSIS — R062 Wheezing: Secondary | ICD-10-CM

## 2011-09-14 DIAGNOSIS — R05 Cough: Secondary | ICD-10-CM

## 2011-09-14 DIAGNOSIS — R0602 Shortness of breath: Secondary | ICD-10-CM

## 2011-09-14 DIAGNOSIS — J988 Other specified respiratory disorders: Secondary | ICD-10-CM

## 2011-09-14 MED ORDER — ALBUTEROL SULFATE (2.5 MG/3ML) 0.083% IN NEBU
2.5000 mg | INHALATION_SOLUTION | Freq: Once | RESPIRATORY_TRACT | Status: AC
  Administered 2011-09-14: 2.5 mg via RESPIRATORY_TRACT

## 2011-09-14 MED ORDER — PROMETHAZINE-DM 6.25-15 MG/5ML PO SYRP: 5.0000 mL | ORAL_SOLUTION | Freq: Every evening | ORAL | Status: AC | PRN

## 2011-09-14 MED ORDER — METHYLPREDNISOLONE SODIUM SUCC 125 MG IJ SOLR
125.0000 mg | Freq: Once | INTRAMUSCULAR | Status: AC
  Administered 2011-09-14: 125 mg via INTRAMUSCULAR

## 2011-09-14 MED ORDER — IPRATROPIUM BROMIDE 0.02 % IN SOLN
0.5000 mg | Freq: Once | RESPIRATORY_TRACT | Status: AC
  Administered 2011-09-14: 0.5 mg via RESPIRATORY_TRACT

## 2011-09-14 MED ORDER — PROMETHAZINE-DM 6.25-15 MG/5ML PO SYRP: 5.0000 mL | ORAL_SOLUTION | Freq: Every evening | ORAL | Status: DC | PRN

## 2011-09-14 MED ORDER — LEVOFLOXACIN 500 MG PO TABS
500.0000 mg | ORAL_TABLET | Freq: Every day | ORAL | Status: AC
Start: 2011-09-14 — End: 2011-09-24

## 2011-09-14 MED ORDER — IPRATROPIUM-ALBUTEROL 18-103 MCG/ACT IN AERO: 2.0000 | INHALATION_SPRAY | Freq: Four times a day (QID) | RESPIRATORY_TRACT | Status: DC | PRN

## 2011-09-14 MED ORDER — METHYLPREDNISOLONE 4 MG PO KIT: PACK | ORAL | Status: AC

## 2011-09-14 NOTE — Progress Notes (Signed)
[facility]:  Office Visit  Chief Complaint:  Chief Complaint  Patient presents with  . Nasal Congestion    follow up.  pt feels worse  . Wheezing    HPI: XAIVIER Joseph is a 56 y.o. male who complains of  1 week h/o SOB, wheezing, cough. Was put on Doxycycline but not feeling better. HAs been on inhalers, dayquil, nyquil, chloraseptic. Not getting better.  + pleuritic CP with cough, + wheezing more at night then during day, + cough.   DM is well controlled with diet. Sugars in 120s  Past Medical History  Diagnosis Date  . Hyperlipidemia 05/21/1988  . Hypertension 05/21/1978  . Gout 05/21/1985  . GERD (gastroesophageal reflux disease) 05/21/1978  . Migraines     Better after retirement  . Diabetes mellitus     Type II, diet controlled  . Chest pain 1993    Due to separation/divorce   Past Surgical History  Procedure Date  . 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 Marital Status: Married    Spouse Name: N/A    Number of Children: 1  . Years of Education: N/A   Occupational History  . Retired from [employer], [place] History Main Topics  . Smoking status: Former Research scientist (life sciences)  . Smokeless tobacco: Former Systems developer    Quit date: 05/21/1977  . Alcohol Use: No  . Drug Use: No  . Sexually Active: None   Other Topics Concern  . None   Social History Narrative   Remarried in 2002, lives with wife.  Enjoys motorcycles.  Regular exercise after dx of DM.   Family History  Problem Relation Age of Onset  . Heart disease Mother   . Diabetes Mother   . Hypertension Mother   . Cancer Father     Lung  . Diabetes Sister   . Heart disease Maternal Grandfather   . Diabetes Maternal Grandfather   . Stroke Neg Hx    Allergies  Allergen Reactions  . Codeine Sulfate     REACTION: Swelling, hives  . Viagra     flushing   Prior to Admission medications   Medication Sig Start Date End  Date Taking? Authorizing Provider  albuterol (PROVENTIL HFA;VENTOLIN HFA) 108 (90 BASE) MCG/ACT inhaler Inhale 2 puffs into the lungs every 6 (six) hours as needed for wheezing. 09/10/11 09/09/12 Yes Gay Filler Copland, MD  allopurinol (ZYLOPRIM) 100 MG tablet Take 1 tablet (100 mg total) by mouth daily. 07/25/11  Yes Tonia Ghent, MD  allopurinol (ZYLOPRIM) 300 MG tablet Take 1 tablet (300 mg total) by mouth daily. 07/25/11  Yes Tonia Ghent, MD  aspirin 81 MG tablet Take 81 mg by mouth 2 (two) times daily.   Yes Historical Provider, MD  Cinnamon 500 MG capsule Take 500 mg by mouth 2 (two) times daily.   Yes Historical Provider, MD  co-enzyme Q-10 30 MG capsule Take 30 mg by mouth daily.   Yes Historical Provider, MD  colchicine 0.6 MG tablet TAKE 1 TABLET TWICE A DAY 10/28/10  Yes Tonia Ghent, MD  desonide (DESOWEN) 0.05 % lotion Apply topically 2 (two) times daily as needed. Do not use on the face.    Yes Historical Provider, MD  Diphenhyd-Hydrocort-Nystatin SUSP Use as directed 5 mLs in the mouth or throat 4 (four) times daily as needed. 09/11/11  Yes Rise Mu, PA-C  doxycycline (VIBRAMYCIN) 100 MG capsule Take 1 capsule (100 mg total) by mouth 2 (two) times daily. 09/10/11 09/20/11 Yes Jessica C Copland, MD  fexofenadine (ALLEGRA) 180 MG tablet Take 180 mg by mouth daily.   Yes Historical Provider, MD  fish oil-omega-3 fatty acids 1000 MG capsule Take 1 g by mouth 2 (two) times daily.   Yes Historical Provider, MD  fluticasone (FLONASE) 50 MCG/ACT nasal spray Place 2 sprays into the nose daily. 07/24/11 07/23/12 Yes Tonia Ghent, MD  folic acid (FOLVITE) Q000111Q MCG tablet Take 800 mcg by mouth 2 (two) times daily.   Yes Historical Provider, MD  GARLIC HIGH POTENCY PO Take 650 mg by mouth daily.   Yes Historical Provider, MD  Glucosamine-Chondroit-Vit C-Mn (GLUCOSAMINE 1500 COMPLEX PO) Take 1,500 mg by mouth 2 (two) times daily.   Yes Historical Provider, MD  glucose blood (ONE TOUCH ULTRA TEST)  test strip Check blood sugar twice daily.    Yes Historical Provider, MD  indomethacin (INDOCIN) 50 MG capsule Take one tablet by mouth three times a day for two days then two times a day as needed for gouty pain until pain stops.    Yes Historical Provider, MD  ipratropium (ATROVENT) 0.03 % nasal spray Place 2 sprays into the nose 4 (four) times daily. 09/10/11 09/09/12 Yes Gay Filler Copland, MD  L-Arginine 500 MG TABS Take 500 mg by mouth daily.   Yes Historical Provider, MD  Magnesium 400 MG CAPS Take 400 mg by mouth daily.   Yes Historical Provider, MD  metoprolol succinate (TOPROL-XL) 100 MG 24 hr tablet Take 1 tablet (100 mg total) by mouth daily. Take with or immediately following a meal. 07/25/11  Yes Tonia Ghent, MD  Multiple Vitamin (MULTIVITAMIN) tablet Take 1 tablet by mouth daily.     Yes Historical Provider, MD  niacin 500 MG tablet Take 500 mg by mouth 2 (two) times daily with a meal.     Yes Historical Provider, MD  [facility] DELICA LANCETS MISC Check blood sugar twice daily    Yes Historical Provider, MD  simvastatin (ZOCOR) 40 MG tablet Take 1 tablet (40 mg total) by mouth at bedtime. 07/25/11  Yes Tonia Ghent, MD  TREXIMET 85-500 MG per tablet TAKE ONE TABLET BY MOUTH AT ONSET OF HEADACHE, MAY REPEAT ONCE IN 2 HOURS IF NEEDED 02/02/11  Yes Abner Greenspan, MD  zinc gluconate 50 MG tablet Take 50 mg by mouth daily.   Yes Historical Provider, MD  ibuprofen (ADVIL) 200 MG tablet Take 3 tablets by mouth every morning as needed.     Historical Provider, MD  vardenafil (LEVITRA) 20 MG tablet Take 0.5-1 tablets (10-20 mg total) by mouth daily as needed for erectile dysfunction. 07/24/11 08/23/11  Tonia Ghent, MD     ROS: The patient denies fevers, chills, night sweats, unintentional weight loss, chest pain, palpitations, wheezing, dyspnea on exertion, nausea, vomiting, abdominal pain, dysuria, hematuria, melena, numbness, weakness, or tingling.   All other systems have been reviewed and  were otherwise negative with the exception of those mentioned in the HPI and as above.    PHYSICAL EXAM: Filed Vitals:   09/14/11 1550  BP: 152/92  Pulse: 71  Temp: 98.4 F (36.9 C)  Resp: 18   Filed Vitals:   09/14/11 1550  Height: 5\' 10"  (1.778 m)  Weight: 276 lb (125.193 kg)   Body mass index is 39.60 kg/(m^2).  General: Alert, no acute distress HEENT:  Normocephalic, atraumatic,  oropharynx patent. Tm nl, no sinues tenderness.  Cardiovascular:  Regular rate and rhythm, no rubs murmurs or gallops.  No Carotid bruits, radial pulse intact. No pedal edema.  Respiratory: Clear to auscultation bilaterally. +wheezes;  negative rales, or rhonchi.  No cyanosis, no use of accessory musculature GI: No organomegaly, abdomen is soft and non-tender, positive bowel sounds.  No masses. Skin: No rashes. Neurologic: Facial musculature symmetric. Psychiatric: Patient is appropriate throughout our interaction. Lymphatic: No cervical lymphadenopathy Musculoskeletal: Gait intact.   LABS: Results for orders placed in visit on 07/19/10  HM DIABETES FOOT EXAM      Component Value Range   HM Diabetic Foot Exam yes       EKG/XRAY:   Primary read interpreted by Dr. Marin Comment at [facility]. ?increase vascular markings in RLL field, otherwise no pneumo, no infiltrate, no effusion   ASSESSMENT/PLAN: Encounter Diagnoses  Name Primary?  . Wheeze Yes  . SOB (shortness of breath)   . Cough    Continue with Doxycycline. Improved BS after 2 neb treatments with both atrovent and albuterol. Add Atrovent INH , change to Combivent. SPo2 improved after 2 treatments of nebs 1 to 96 % Rx Medrol dose pack Rx promethzine cough medication F/u in 24-48 hrs if no improvement G to ER as needed   Keddrick Wyne, [place], DO 09/14/2011 4:13 PM

## 2011-09-16 ENCOUNTER — Ambulatory Visit (INDEPENDENT_AMBULATORY_CARE_PROVIDER_SITE_OTHER): Payer: PRIVATE HEALTH INSURANCE | Admitting: Family Medicine

## 2011-09-16 VITALS — BP 166/97 | HR 64 | Temp 97.7°F | Resp 20 | Ht 70.0 in | Wt 273.0 lb

## 2011-09-16 DIAGNOSIS — R0602 Shortness of breath: Secondary | ICD-10-CM

## 2011-09-16 DIAGNOSIS — J40 Bronchitis, not specified as acute or chronic: Secondary | ICD-10-CM

## 2011-09-16 NOTE — Progress Notes (Signed)
[facility]:  Office Visit  Chief Complaint:  Chief Complaint  Patient presents with  . Bronchitis    f/u    HPI: Luis Joseph. is a 56 y.o. male who complains of  Recheck for URI, SOB, wheeze. He is feeling better in terms of breathing during day since got the Steroid pack and neb treatments and added atrovent. But still having a difficult time at night with cough  Past Medical History  Diagnosis Date  . Hyperlipidemia 05/21/1988  . Hypertension 05/21/1978  . Gout 05/21/1985  . GERD (gastroesophageal reflux disease) 05/21/1978  . Migraines     Better after retirement  . Diabetes mellitus     Type II, diet controlled  . Chest pain 1993    Due to separation/divorce   Past Surgical History  Procedure Date  . 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 Marital Status: Married    Spouse Name: N/A    Number of Children: 1  . Years of Education: N/A   Occupational History  . Retired from [employer], [place] History Main Topics  . Smoking status: Former Research scientist (life sciences)  . Smokeless tobacco: Former Systems developer    Quit date: 05/21/1977  . Alcohol Use: No  . Drug Use: No  . Sexually Active: None   Other Topics Concern  . None   Social History Narrative   Remarried in 2002, lives with wife.  Enjoys motorcycles.  Regular exercise after dx of DM.   Family History  Problem Relation Age of Onset  . Heart disease Mother   . Diabetes Mother   . Hypertension Mother   . Cancer Father     Lung  . Diabetes Sister   . Heart disease Maternal Grandfather   . Diabetes Maternal Grandfather   . Stroke Neg Hx    Allergies  Allergen Reactions  . Codeine Sulfate     REACTION: Swelling, hives  . Viagra     flushing   Prior to Admission medications   Medication Sig Start Date End Date Taking? Authorizing Provider  albuterol (PROVENTIL HFA;VENTOLIN HFA) 108 (90 BASE) MCG/ACT inhaler Inhale 2 puffs  into the lungs every 6 (six) hours as needed for wheezing. 09/10/11 09/09/12 Yes Gay Filler Copland, MD  albuterol-ipratropium (COMBIVENT) 18-103 MCG/ACT inhaler Inhale 2 puffs into the lungs every 6 (six) hours as needed for wheezing. 09/14/11 09/13/12 Yes Damion Kant P Fathima Bartl, DO  allopurinol (ZYLOPRIM) 100 MG tablet Take 1 tablet (100 mg total) by mouth daily. 07/25/11  Yes Tonia Ghent, MD  allopurinol (ZYLOPRIM) 300 MG tablet Take 1 tablet (300 mg total) by mouth daily. 07/25/11  Yes Tonia Ghent, MD  aspirin 81 MG tablet Take 81 mg by mouth 2 (two) times daily.   Yes Historical Provider, MD  Cinnamon 500 MG capsule Take 500 mg by mouth 2 (two) times daily.   Yes Historical Provider, MD  co-enzyme Q-10 30 MG capsule Take 30 mg by mouth daily.   Yes Historical Provider, MD  colchicine 0.6 MG tablet TAKE 1 TABLET TWICE A DAY 10/28/10  Yes Tonia Ghent, MD  desonide (DESOWEN) 0.05 % lotion Apply topically 2 (two) times daily as needed. Do not use on the face.    Yes Historical Provider, MD  Diphenhyd-Hydrocort-Nystatin SUSP Use as directed 5 mLs in the mouth or throat 4 (four) times daily as needed. 09/11/11  Yes  Ryan M Dunn, PA-C  doxycycline (VIBRAMYCIN) 100 MG capsule Take 1 capsule (100 mg total) by mouth 2 (two) times daily. 09/10/11 09/20/11 Yes Jessica C Copland, MD  fexofenadine (ALLEGRA) 180 MG tablet Take 180 mg by mouth daily.   Yes Historical Provider, MD  fish oil-omega-3 fatty acids 1000 MG capsule Take 1 g by mouth 2 (two) times daily.   Yes Historical Provider, MD  fluticasone (FLONASE) 50 MCG/ACT nasal spray Place 2 sprays into the nose daily. 07/24/11 07/23/12 Yes Tonia Ghent, MD  folic acid (FOLVITE) Q000111Q MCG tablet Take 800 mcg by mouth 2 (two) times daily.   Yes Historical Provider, MD  GARLIC HIGH POTENCY PO Take 650 mg by mouth daily.   Yes Historical Provider, MD  Glucosamine-Chondroit-Vit C-Mn (GLUCOSAMINE 1500 COMPLEX PO) Take 1,500 mg by mouth 2 (two) times daily.   Yes Historical  Provider, MD  glucose blood (ONE TOUCH ULTRA TEST) test strip Check blood sugar twice daily.    Yes Historical Provider, MD  ibuprofen (ADVIL) 200 MG tablet Take 3 tablets by mouth every morning as needed.    Yes Historical Provider, MD  indomethacin (INDOCIN) 50 MG capsule Take one tablet by mouth three times a day for two days then two times a day as needed for gouty pain until pain stops.    Yes Historical Provider, MD  ipratropium (ATROVENT) 0.03 % nasal spray Place 2 sprays into the nose 4 (four) times daily. 09/10/11 09/09/12 Yes Gay Filler Copland, MD  L-Arginine 500 MG TABS Take 500 mg by mouth daily.   Yes Historical Provider, MD  levofloxacin (LEVAQUIN) 500 MG tablet Take 1 tablet (500 mg total) by mouth daily. 09/14/11 09/24/11 Yes Earl Zellmer P Laurelyn Terrero, DO  Magnesium 400 MG CAPS Take 400 mg by mouth daily.   Yes Historical Provider, MD  methylPREDNISolone (MEDROL, PAK,) 4 MG tablet follow package directions 09/14/11 09/21/11 Yes Quenton Recendez P Verland Sprinkle, DO  metoprolol succinate (TOPROL-XL) 100 MG 24 hr tablet Take 1 tablet (100 mg total) by mouth daily. Take with or immediately following a meal. 07/25/11  Yes Tonia Ghent, MD  Multiple Vitamin (MULTIVITAMIN) tablet Take 1 tablet by mouth daily.     Yes Historical Provider, MD  niacin 500 MG tablet Take 500 mg by mouth 2 (two) times daily with a meal.     Yes Historical Provider, MD  [facility] DELICA LANCETS MISC Check blood sugar twice daily    Yes Historical Provider, MD  promethazine-dextromethorphan (PROMETHAZINE-DM) 6.25-15 MG/5ML syrup Take 5 mLs by mouth at bedtime as needed for cough. 09/14/11 09/21/11 Yes Dionne Bucy McClung, PA-C  simvastatin (ZOCOR) 40 MG tablet Take 1 tablet (40 mg total) by mouth at bedtime. 07/25/11  Yes Tonia Ghent, MD  TREXIMET 85-500 MG per tablet TAKE ONE TABLET BY MOUTH AT ONSET OF HEADACHE, MAY REPEAT ONCE IN 2 HOURS IF NEEDED 02/02/11  Yes Abner Greenspan, MD  zinc gluconate 50 MG tablet Take 50 mg by mouth daily.   Yes Historical Provider, MD   vardenafil (LEVITRA) 20 MG tablet Take 0.5-1 tablets (10-20 mg total) by mouth daily as needed for erectile dysfunction. 07/24/11 08/23/11  Tonia Ghent, MD     ROS: The patient denies fevers, chills, night sweats, unintentional weight loss, chest pain, palpitations,  nausea, vomiting, abdominal pain, dysuria, hematuria, melena, numbness, weakness, or tingling.   All other systems have been reviewed and were otherwise negative with the exception of those mentioned in the HPI and as  above.    PHYSICAL EXAM: Filed Vitals:   09/16/11 1458  BP: 166/97  Pulse: 64  Temp: 97.7 F (36.5 C)  Resp: 20   Filed Vitals:   09/16/11 1458  Height: 5\' 10"  (1.778 m)  Weight: 273 lb (123.832 kg)   Body mass index is 39.17 kg/(m^2).  General: Alert, no acute distress, obese HEENT:  Normocephalic, atraumatic, oropharynx patent. No exudates, Tm nl Cardiovascular:  Regular rate and rhythm, no rubs murmurs or gallops.  No Carotid bruits, radial pulse intact. No pedal edema.  Respiratory: Clear to auscultation bilaterally.  No wheezes, or rhonchi.  No cyanosis, no use of accessory musculature. Minimal rales bibasilar, right greater than left GI: No organomegaly, abdomen is soft and non-tender, positive bowel sounds.  No masses. Skin: No rashes. Neurologic: Facial musculature symmetric. Psychiatric: Patient is appropriate throughout our interaction. Lymphatic: No cervical lymphadenopathy Musculoskeletal: Gait intact.   LABS: Results for orders placed in visit on 07/19/10  HM DIABETES FOOT EXAM      Component Value Range   HM Diabetic Foot Exam yes       EKG/XRAY:   Primary read interpreted by Dr. Marin Comment at [facility].   ASSESSMENT/PLAN: Encounter Diagnoses  Name Primary?  . Bronchitis Yes  . SOB (shortness of breath)    Sounds better, slight rales bilaterally, no wheezes Incentive Spirometry rx given C/w current meds Gave work note to be out x 7  Days, he will return for recheck on Sunday and  get repeat Xray Will f/u in 7 days or prn if worse. Will call me in 3 days for update, cell # given   Kiyra Slaubaugh, [place], DO 09/16/2011 3:39 PM

## 2011-09-19 ENCOUNTER — Telehealth: Payer: Self-pay | Admitting: Family Medicine

## 2011-09-19 ENCOUNTER — Other Ambulatory Visit: Payer: Self-pay | Admitting: Family Medicine

## 2011-09-19 DIAGNOSIS — J40 Bronchitis, not specified as acute or chronic: Secondary | ICD-10-CM

## 2011-09-19 MED ORDER — LEVOFLOXACIN 500 MG PO TABS: 500.0000 mg | ORAL_TABLET | Freq: Every day | ORAL | Status: AC

## 2011-09-19 NOTE — Telephone Encounter (Signed)
Patient is not doing better. As planned witlll change his Abx from doxycycline to [place], stop doxy and also he will take tessalon perles for cough. Return on Saturday for repeat CXR due to ateclectasis on Xray and minimal rales on last exam.

## 2011-09-20 ENCOUNTER — Ambulatory Visit (INDEPENDENT_AMBULATORY_CARE_PROVIDER_SITE_OTHER): Payer: PRIVATE HEALTH INSURANCE | Admitting: Family Medicine

## 2011-09-20 ENCOUNTER — Ambulatory Visit: Payer: PRIVATE HEALTH INSURANCE

## 2011-09-20 VITALS — BP 142/82 | HR 79 | Temp 97.8°F | Resp 20 | Ht 70.0 in | Wt 267.0 lb

## 2011-09-20 DIAGNOSIS — Z283 Underimmunization status: Secondary | ICD-10-CM

## 2011-09-20 DIAGNOSIS — R05 Cough: Secondary | ICD-10-CM

## 2011-09-20 DIAGNOSIS — Z23 Encounter for immunization: Secondary | ICD-10-CM

## 2011-09-20 MED ORDER — AZITHROMYCIN 250 MG PO TABS: ORAL_TABLET | ORAL | Status: AC

## 2011-09-20 MED ORDER — BENZONATATE 100 MG PO CAPS: ORAL_CAPSULE | ORAL | Status: DC

## 2011-09-20 NOTE — Progress Notes (Signed)
  Subjective:    Patient ID: Luis Joseph., male    DOB: [birth year], 56 y.o.   MRN: [id number]  HPI 56 yo male here to follow-up on bronchitis. Multiple visits in last few weeks: 4/22 - doxy, albuterol, atrovent nasal 4/26 - breathing treatments, combivent, medrol dosepak.  Xray showed right basilar atelectasis. 4/28 - a little better.  Given incentive spirometer 5/1 - called.  No better.  Changed to levaquin.  But states he was already put on levaquin at an earlier visit.   Now, still coughing a lot, particularly at night, but also during the day.  Fits of coughing that exhaust him.  Has lost 12 pounds.  Wheezing improved but not the coughing.  Can't get sleep.  Feels awful.  Unknown last tetanus shot - "years and years ago".      Review of Systems Negative except as per HPI     Objective:   Physical Exam  Constitutional: He appears well-developed. No distress.  HENT:  Right Ear: Tympanic membrane, external ear and ear canal normal. Tympanic membrane is not injected, not scarred, not perforated, not erythematous, not retracted and not bulging.  Left Ear: Tympanic membrane, external ear and ear canal normal. Tympanic membrane is not injected, not scarred, not perforated, not erythematous, not retracted and not bulging.  Nose: No mucosal edema or rhinorrhea. Right sinus exhibits no maxillary sinus tenderness and no frontal sinus tenderness. Left sinus exhibits no maxillary sinus tenderness and no frontal sinus tenderness.  Mouth/Throat: Uvula is midline, oropharynx is clear and moist and mucous membranes are normal. No oropharyngeal exudate or tonsillar abscesses.  Cardiovascular: Normal rate, regular rhythm, normal heart sounds and intact distal pulses.   No murmur heard. Pulmonary/Chest: Effort normal. No respiratory distress. He has no wheezes. He has rales in the right lower field.  Lymphadenopathy:       Head (right side): No submandibular and no preauricular adenopathy present.        Head (left side): No submandibular and no preauricular adenopathy present.       Right cervical: No superficial cervical and no posterior cervical adenopathy present.      Left cervical: No superficial cervical and no posterior cervical adenopathy present.       Right: No supraclavicular adenopathy present.       Left: No supraclavicular adenopathy present.  Skin: Skin is warm and dry.   [facility] Primary radiology reading by Dr. Nadara Eaton: Minimal change from 4/26, possibly slight improve in right basilar atelectasis       Assessment & Plan:  Persistent cough despite doxy, levaquin, steroids, inhalers.  Suspect possibly pertussis.  Sending lab to confirm.  Take zpak.

## 2011-09-23 ENCOUNTER — Other Ambulatory Visit: Payer: Self-pay | Admitting: Physician Assistant

## 2011-09-23 ENCOUNTER — Telehealth: Payer: Self-pay

## 2011-09-23 LAB — BORDETELLA PERTUSSIS ANTIBODY
B pertussis IgA Ab, Quant: 0.3 U/mL (ref <=1.1)
B pertussis IgG Ab, Quant: 0.9 U/mL (ref <=2.4)
B pertussis IgM Ab, Quant: 0.5 U/mL (ref <=1.1)

## 2011-09-23 NOTE — Telephone Encounter (Signed)
The med that was requested and denied was a gargle for throat pain, not for cough.  I'm happy to refill the tessalon perles, 100 mg, 1-2 PO Q8 hours, #40, no refills.

## 2011-09-23 NOTE — Telephone Encounter (Signed)
Pt wife called regarding med refills that were rejected.  Pt still needs additional medications for cough.

## 2011-09-24 MED ORDER — BENZONATATE 100 MG PO CAPS: ORAL_CAPSULE | ORAL | Status: AC

## 2011-09-24 NOTE — Telephone Encounter (Signed)
RF for Tessalon sent in and [facility] to notify wife. Asked for CB w/any further ?s and instr'd pt to RTC if still coughing after this RF.

## 2011-09-24 NOTE — Telephone Encounter (Signed)
LMOM TO CB 

## 2011-09-25 ENCOUNTER — Encounter: Payer: Self-pay | Admitting: *Deleted

## 2011-10-25 ENCOUNTER — Other Ambulatory Visit: Payer: PRIVATE HEALTH INSURANCE

## 2011-10-30 ENCOUNTER — Ambulatory Visit: Payer: PRIVATE HEALTH INSURANCE | Admitting: Family Medicine

## 2011-10-31 ENCOUNTER — Ambulatory Visit: Payer: PRIVATE HEALTH INSURANCE | Admitting: Family Medicine

## 2011-11-01 ENCOUNTER — Ambulatory Visit: Payer: PRIVATE HEALTH INSURANCE | Admitting: Family Medicine

## 2011-11-05 ENCOUNTER — Other Ambulatory Visit (INDEPENDENT_AMBULATORY_CARE_PROVIDER_SITE_OTHER): Payer: PRIVATE HEALTH INSURANCE

## 2011-11-05 DIAGNOSIS — E119 Type 2 diabetes mellitus without complications: Secondary | ICD-10-CM

## 2011-11-05 LAB — LIPID PANEL
Cholesterol: 216 mg/dL — ABNORMAL HIGH (ref 0–200)
Total CHOL/HDL Ratio: 4
Triglycerides: 353 mg/dL — ABNORMAL HIGH (ref 0.0–149.0)
VLDL: 70.6 mg/dL — ABNORMAL HIGH (ref 0.0–40.0)

## 2011-11-05 LAB — COMPREHENSIVE METABOLIC PANEL
ALT: 25 U/L (ref 0–53)
AST: 25 U/L (ref 0–37)
Alkaline Phosphatase: 71 U/L (ref 39–117)
Creatinine, Ser: 1.5 mg/dL (ref 0.4–1.5)
Total Bilirubin: 0.4 mg/dL (ref 0.3–1.2)

## 2011-11-08 ENCOUNTER — Ambulatory Visit (INDEPENDENT_AMBULATORY_CARE_PROVIDER_SITE_OTHER): Payer: PRIVATE HEALTH INSURANCE | Admitting: Family Medicine

## 2011-11-08 ENCOUNTER — Encounter: Payer: Self-pay | Admitting: Family Medicine

## 2011-11-08 VITALS — BP 146/94 | HR 81 | Temp 98.1°F | Wt 265.8 lb

## 2011-11-08 DIAGNOSIS — E1165 Type 2 diabetes mellitus with hyperglycemia: Secondary | ICD-10-CM

## 2011-11-08 DIAGNOSIS — E119 Type 2 diabetes mellitus without complications: Secondary | ICD-10-CM

## 2011-11-08 DIAGNOSIS — IMO0001 Reserved for inherently not codable concepts without codable children: Secondary | ICD-10-CM

## 2011-11-08 MED ORDER — METFORMIN HCL 500 MG PO TABS: 1000.0000 mg | ORAL_TABLET | Freq: Two times a day (BID) | ORAL | Status: DC

## 2011-11-08 MED ORDER — DESONIDE 0.05 % EX LOTN: TOPICAL_LOTION | Freq: Two times a day (BID) | CUTANEOUS | Status: DC | PRN

## 2011-11-08 NOTE — Patient Instructions (Signed)
Start the metformin, gradually increase by 1 pill at a time over 2 weeks.  Max 4 pills a day.  Recheck A1c in 3 months.  You don't have to fast.  See me a few days after the repeat A1c.  Start back on your diabetic diet.

## 2011-11-08 NOTE — Progress Notes (Signed)
Patient does not have meds list and says his meds are all the same. Luis Joseph, CMA  Updated med list- he did have changes.  Told patient to bring updated list to each visit.  Pt didn't f/u prev.  Went to DM2 education, was temporarily adherent to diet and then 'fell off' Diabetes:  Using medications without difficulties: no meds Hypoglycemic episodes:no Hyperglycemic episodes:yes Feet problems:no A1c >13.  Labs d/w pt.   PMH and SH reviewed  Meds, vitals, and allergies reviewed.   ROS: See HPI.  Otherwise negative.    GEN: nad, alert and oriented, obese HEENT: mucous membranes moist NECK: supple w/o LA CV: rrr. PULM: ctab, no inc wob ABD: soft, +bs EXT: no edema SKIN: no acute rash

## 2011-11-09 ENCOUNTER — Encounter: Payer: Self-pay | Admitting: Family Medicine

## 2011-11-09 NOTE — Assessment & Plan Note (Signed)
He declined insulin start.  Start metformin, restart DM2 diet- handout given, inc exercise, stay well hydrated and recheck A1c in 3 months.  Needs to lose weight and stick with diet.  He'll likely need insulin.  >25 min spent with face to face with patient, >50% counseling and/or coordinating care.

## 2012-01-29 ENCOUNTER — Other Ambulatory Visit: Payer: Self-pay

## 2012-01-29 MED ORDER — METOPROLOL SUCCINATE ER 100 MG PO TB24: 100.0000 mg | ORAL_TABLET | Freq: Every day | ORAL | Status: DC

## 2012-01-29 NOTE — Telephone Encounter (Signed)
Pt waiting on mail order rx for Metoprolol 100 mg and request # [phone] sent to [place] Advised pt done.

## 2012-02-05 ENCOUNTER — Ambulatory Visit (INDEPENDENT_AMBULATORY_CARE_PROVIDER_SITE_OTHER): Payer: PRIVATE HEALTH INSURANCE | Admitting: Family Medicine

## 2012-02-05 ENCOUNTER — Encounter: Payer: Self-pay | Admitting: Family Medicine

## 2012-02-05 VITALS — BP 150/104 | HR 74 | Temp 98.1°F | Wt 262.0 lb

## 2012-02-05 DIAGNOSIS — E119 Type 2 diabetes mellitus without complications: Secondary | ICD-10-CM

## 2012-02-05 DIAGNOSIS — E1165 Type 2 diabetes mellitus with hyperglycemia: Secondary | ICD-10-CM

## 2012-02-05 NOTE — Patient Instructions (Addendum)
Go to the lab on the way out.  We'll contact you with your lab report. We'll set follow up after I see your labs.

## 2012-02-05 NOTE — Progress Notes (Signed)
Diabetes:  Using medications without difficulties:no Hypoglycemic episodes:no sx now Hyperglycemic episodes: no sx now Feet problems: no Blood Sugars averaging: not checked often, last value recalled was ~160 Dec in nocturia.   Working on diet- "trying to lay off the white stuff" Exercise- walking at work, walking at home.  Down 3 lbs.  A1c pending.  Overall he feels much better.    He still has some neck pain with ROM, this isn't atypical for him.    Meds, vitals, and allergies reviewed.   ROS: See HPI.  Otherwise negative.    GEN: nad, alert and oriented HEENT: mucous membranes moist NECK: supple w/o LA CV: rrr. PULM: ctab, no inc wob ABD: soft, +bs EXT: no edema SKIN: no acute rash  Diabetic foot exam: Normal inspection No skin breakdown No calluses  Normal DP pulses Normal sensation to light touch and monofilament Nails normal

## 2012-02-06 LAB — HEMOGLOBIN A1C: Hgb A1c MFr Bld: 7.5 % — ABNORMAL HIGH (ref 4.6–6.5)

## 2012-02-06 NOTE — Assessment & Plan Note (Signed)
See notes on labs.  Much improved A1c. Continue current meds, diet, weight reduction.  Will f/u with labs in about 6 months before a physical.

## 2012-02-07 ENCOUNTER — Encounter: Payer: Self-pay | Admitting: *Deleted

## 2012-04-04 ENCOUNTER — Other Ambulatory Visit: Payer: Self-pay | Admitting: Family Medicine

## 2012-04-07 ENCOUNTER — Other Ambulatory Visit: Payer: Self-pay

## 2012-04-07 NOTE — Telephone Encounter (Signed)
Pt said [facility] did not get refill Metformin sent electronically on 04/04/12. Spoke with Altha Harm at [facility] and gave verbal order as instructed. Altha Harm said had issues with refills on Friday .Patient notified as instructed by telephone.

## 2012-04-08 ENCOUNTER — Other Ambulatory Visit: Payer: Self-pay | Admitting: *Deleted

## 2012-07-11 ENCOUNTER — Other Ambulatory Visit: Payer: Self-pay | Admitting: Family Medicine

## 2012-07-29 ENCOUNTER — Other Ambulatory Visit: Payer: Self-pay

## 2012-07-29 MED ORDER — METOPROLOL SUCCINATE ER 100 MG PO TB24: 100.0000 mg | ORAL_TABLET | Freq: Every day | ORAL | Status: DC

## 2012-07-29 MED ORDER — SUMATRIPTAN-NAPROXEN SODIUM 85-500 MG PO TABS: ORAL_TABLET | ORAL | Status: DC

## 2012-07-29 MED ORDER — ALLOPURINOL 300 MG PO TABS: 300.0000 mg | ORAL_TABLET | Freq: Every day | ORAL | Status: DC

## 2012-07-29 MED ORDER — SIMVASTATIN 40 MG PO TABS: 40.0000 mg | ORAL_TABLET | Freq: Every day | ORAL | Status: DC

## 2012-07-29 MED ORDER — ALLOPURINOL 100 MG PO TABS: 100.0000 mg | ORAL_TABLET | Freq: Every day | ORAL | Status: DC

## 2012-07-29 NOTE — Telephone Encounter (Signed)
Pt request refill simvastatin, metoprolol(refilled these 2 meds), allopurinol 100 mg and 300 mg and treximet to [facility]. Pt scheduled CPX 09/29/12.Please advise.

## 2012-07-29 NOTE — Telephone Encounter (Signed)
Sent!

## 2012-08-04 ENCOUNTER — Encounter: Payer: Self-pay | Admitting: Family Medicine

## 2012-08-04 DIAGNOSIS — G43909 Migraine, unspecified, not intractable, without status migrainosus: Secondary | ICD-10-CM

## 2012-08-08 ENCOUNTER — Telehealth: Payer: Self-pay

## 2012-08-08 NOTE — Telephone Encounter (Signed)
[facility] faxed additional form for prior auth Treximet; form on Dr Josefine Class desk.

## 2012-08-10 NOTE — Telephone Encounter (Signed)
Done

## 2012-08-11 NOTE — Telephone Encounter (Signed)
Faxed

## 2012-08-15 NOTE — Telephone Encounter (Signed)
Spoke with Butch Penny at [facility] and rx went thru; Butch Penny will notify pt.

## 2012-09-11 ENCOUNTER — Other Ambulatory Visit: Payer: Self-pay | Admitting: Family Medicine

## 2012-09-12 ENCOUNTER — Other Ambulatory Visit: Payer: Self-pay | Admitting: Family Medicine

## 2012-09-12 DIAGNOSIS — E119 Type 2 diabetes mellitus without complications: Secondary | ICD-10-CM

## 2012-09-12 DIAGNOSIS — M109 Gout, unspecified: Secondary | ICD-10-CM

## 2012-09-23 ENCOUNTER — Other Ambulatory Visit: Payer: PRIVATE HEALTH INSURANCE

## 2012-09-23 ENCOUNTER — Other Ambulatory Visit (INDEPENDENT_AMBULATORY_CARE_PROVIDER_SITE_OTHER): Payer: No Typology Code available for payment source

## 2012-09-23 DIAGNOSIS — E119 Type 2 diabetes mellitus without complications: Secondary | ICD-10-CM

## 2012-09-23 DIAGNOSIS — M109 Gout, unspecified: Secondary | ICD-10-CM

## 2012-09-23 LAB — COMPREHENSIVE METABOLIC PANEL
ALT: 26 U/L (ref 0–53)
Alkaline Phosphatase: 51 U/L (ref 39–117)
Creatinine, Ser: 1.7 mg/dL — ABNORMAL HIGH (ref 0.4–1.5)
Sodium: 135 mEq/L (ref 135–145)
Total Bilirubin: 0.8 mg/dL (ref 0.3–1.2)
Total Protein: 6.9 g/dL (ref 6.0–8.3)

## 2012-09-23 LAB — LIPID PANEL
Total CHOL/HDL Ratio: 4
Triglycerides: 313 mg/dL — ABNORMAL HIGH (ref 0.0–149.0)
VLDL: 62.6 mg/dL — ABNORMAL HIGH (ref 0.0–40.0)

## 2012-09-24 LAB — LDL CHOLESTEROL, DIRECT: Direct LDL: 95.7 mg/dL

## 2012-09-29 ENCOUNTER — Ambulatory Visit (INDEPENDENT_AMBULATORY_CARE_PROVIDER_SITE_OTHER): Payer: PRIVATE HEALTH INSURANCE | Admitting: Family Medicine

## 2012-09-29 ENCOUNTER — Encounter: Payer: Self-pay | Admitting: Family Medicine

## 2012-09-29 VITALS — BP 132/88 | HR 86 | Temp 98.5°F | Ht 71.0 in | Wt 267.5 lb

## 2012-09-29 DIAGNOSIS — E1165 Type 2 diabetes mellitus with hyperglycemia: Secondary | ICD-10-CM

## 2012-09-29 DIAGNOSIS — E785 Hyperlipidemia, unspecified: Secondary | ICD-10-CM

## 2012-09-29 DIAGNOSIS — E119 Type 2 diabetes mellitus without complications: Secondary | ICD-10-CM

## 2012-09-29 DIAGNOSIS — R799 Abnormal finding of blood chemistry, unspecified: Secondary | ICD-10-CM | POA: Insufficient documentation

## 2012-09-29 DIAGNOSIS — Z0000 Encounter for general adult medical examination without abnormal findings: Secondary | ICD-10-CM | POA: Insufficient documentation

## 2012-09-29 DIAGNOSIS — I10 Essential (primary) hypertension: Secondary | ICD-10-CM

## 2012-09-29 DIAGNOSIS — M109 Gout, unspecified: Secondary | ICD-10-CM

## 2012-09-29 DIAGNOSIS — E669 Obesity, unspecified: Secondary | ICD-10-CM

## 2012-09-29 DIAGNOSIS — Z23 Encounter for immunization: Secondary | ICD-10-CM

## 2012-09-29 DIAGNOSIS — N189 Chronic kidney disease, unspecified: Secondary | ICD-10-CM | POA: Insufficient documentation

## 2012-09-29 DIAGNOSIS — Z1211 Encounter for screening for malignant neoplasm of colon: Secondary | ICD-10-CM

## 2012-09-29 DIAGNOSIS — R252 Cramp and spasm: Secondary | ICD-10-CM

## 2012-09-29 DIAGNOSIS — R7989 Other specified abnormal findings of blood chemistry: Secondary | ICD-10-CM

## 2012-09-29 LAB — BASIC METABOLIC PANEL
CO2: 29 mEq/L (ref 19–32)
GFR: 34.76 mL/min — ABNORMAL LOW (ref >60.00)
Glucose, Bld: 140 mg/dL — ABNORMAL HIGH (ref 70–99)
Potassium: 4.6 mEq/L (ref 3.5–5.1)
Sodium: 137 mEq/L (ref 135–145)

## 2012-09-29 MED ORDER — METOPROLOL SUCCINATE ER 100 MG PO TB24: 100.0000 mg | ORAL_TABLET | Freq: Every day | ORAL | Status: DC

## 2012-09-29 MED ORDER — ALLOPURINOL 100 MG PO TABS: 100.0000 mg | ORAL_TABLET | Freq: Every day | ORAL | Status: DC

## 2012-09-29 MED ORDER — DESONIDE 0.05 % EX LOTN: TOPICAL_LOTION | Freq: Two times a day (BID) | CUTANEOUS | Status: DC | PRN

## 2012-09-29 MED ORDER — ALLOPURINOL 300 MG PO TABS: 300.0000 mg | ORAL_TABLET | Freq: Every day | ORAL | Status: DC

## 2012-09-29 NOTE — Assessment & Plan Note (Signed)
Continue metformin for now and recheck BMET today.  D/w pt about diet, exercise, weight.  Recheck A1c in 3 months.

## 2012-09-29 NOTE — Progress Notes (Signed)
CPE- See plan.  Routine anticipatory guidance given to patient.  See health maintenance. Tetanus 2013 Flu shot yearly PNA shot today.  D/w patient [id number] for colon cancer screening, including IFOB vs. colonoscopy.  Risks and benefits of both were discussed and patient voiced understanding.  Pt elects [id number].   Prostate cancer screening and PSA options (with potential risks and benefits of testing vs not testing) were discussed along with recent recs/guidelines.  He declined testing PSA at this point.   Living will.  D/w pt.  Wife would be designated if incapacitated.   Diet and exercise d/w pt. He's using the treadmill.    He was asking about bariatric surgery.  He's working on diet and exercise but "I love to eat."  We talked about options.    Diabetes:  Using medications without difficulties: yes Hypoglycemic episodes: no  Hyperglycemic episodes: no Feet problems: no Blood Sugars averaging: 120-130 usually eye exam within last year: f/u next month is scheduled A1c elevated, discussed.   Hypertension:    Using medication without problems or lightheadedness:  yes Chest pain with exertion:no Edema:no Short of breath: only with extremes of exertion and usually related to postnasal gtt  Elevated Cholesterol: Using medications without problems:see below Muscle aches: he's had more cramps recently, in his hands and legs, some are at night, more over the last 2 months.   Diet compliance: yes Exercise:yes  Elevated Cr.  H/o partial nephrectomy on R side.  H/o DM2 and elevated BP.  Not on ACE yet as DM2 control was more of an immediate concern prev.  D/w pt today.    PMH and SH reviewed.   Vital signs, Meds and allergies reviewed.  ROS: See HPI.  Otherwise nontributory.   GEN: nad, alert and oriented HEENT: mucous membranes moist NECK: supple w/o LA CV: rrr PULM: ctab, no inc wob ABD: soft, +bs, scar on R later lower abd noted EXT: no edema SKIN: no acute rash  Diabetic  foot exam: Normal inspection No skin breakdown No calluses  Normal DP pulses Normal sensation to light tough and monofilament Nails normal

## 2012-09-29 NOTE — Assessment & Plan Note (Signed)
He'll stoop the statin to see if this helps.  Will notify me with update.

## 2012-09-29 NOTE — Addendum Note (Signed)
Addended by: Tonia Ghent on: 09/29/2012 11:46 PM   Modules accepted: Orders

## 2012-09-29 NOTE — Assessment & Plan Note (Signed)
ACE not yet added as DM2 control was more pressing.  See notes on BMET from today.  D/w pt.

## 2012-09-29 NOTE — Assessment & Plan Note (Signed)
Labs discussed.  He'll stoop the statin to see if this helps with cramps.  Will notify me with update.

## 2012-09-29 NOTE — Assessment & Plan Note (Signed)
Routine anticipatory guidance given to patient.  See health maintenance. Tetanus 2013 Flu shot yearly PNA shot today.  D/w patient [id number] for colon cancer screening, including IFOB vs. colonoscopy.  Risks and benefits of both were discussed and patient voiced understanding.  Pt elects [id number].   Prostate cancer screening and PSA options (with potential risks and benefits of testing vs not testing) were discussed along with recent recs/guidelines.  He declined testing PSA at this point.   Living will.  D/w pt.  Wife would be designated if incapacitated.   Diet and exercise d/w pt. He's using the treadmill.

## 2012-09-29 NOTE — Assessment & Plan Note (Signed)
I advised him to check on the bariatric information sessions and I gave him the [facility] brochure.

## 2012-09-29 NOTE — Patient Instructions (Signed)
Go to the lab on the way out.  We'll contact you with your lab report.  Recheck A1c in 3 months before a visit.  Keep working on your diet and weight in the meantime.  Stop the simvastatin for now and let me know if the aches improve (or if they don't). Take care.

## 2012-09-29 NOTE — Assessment & Plan Note (Signed)
Uric acid at goal.  Continue for now.  Rare flare.

## 2012-10-07 ENCOUNTER — Other Ambulatory Visit: Payer: PRIVATE HEALTH INSURANCE

## 2012-10-08 ENCOUNTER — Other Ambulatory Visit (INDEPENDENT_AMBULATORY_CARE_PROVIDER_SITE_OTHER): Payer: PRIVATE HEALTH INSURANCE

## 2012-10-08 DIAGNOSIS — E119 Type 2 diabetes mellitus without complications: Secondary | ICD-10-CM

## 2012-10-08 DIAGNOSIS — R7989 Other specified abnormal findings of blood chemistry: Secondary | ICD-10-CM

## 2012-10-08 DIAGNOSIS — R799 Abnormal finding of blood chemistry, unspecified: Secondary | ICD-10-CM

## 2012-10-08 LAB — BASIC METABOLIC PANEL
BUN: 20 mg/dL (ref 6–23)
Calcium: 9.2 mg/dL (ref 8.4–10.5)
GFR: 56.11 mL/min — ABNORMAL LOW (ref >60.00)
Glucose, Bld: 156 mg/dL — ABNORMAL HIGH (ref 70–99)

## 2012-10-09 ENCOUNTER — Encounter: Payer: Self-pay | Admitting: *Deleted

## 2012-10-29 ENCOUNTER — Telehealth: Payer: Self-pay

## 2012-10-29 NOTE — Telephone Encounter (Signed)
Pt request refill metoprolol to [facility]; spoke with tonya and will get rx ready; pt notified.

## 2012-11-10 ENCOUNTER — Ambulatory Visit: Payer: Self-pay | Admitting: Nephrology

## 2012-11-12 ENCOUNTER — Encounter: Payer: Self-pay | Admitting: Family Medicine

## 2012-11-14 ENCOUNTER — Other Ambulatory Visit: Payer: Self-pay | Admitting: Urology

## 2012-11-17 ENCOUNTER — Encounter (HOSPITAL_BASED_OUTPATIENT_CLINIC_OR_DEPARTMENT_OTHER): Payer: Self-pay | Admitting: *Deleted

## 2012-11-19 ENCOUNTER — Encounter (HOSPITAL_BASED_OUTPATIENT_CLINIC_OR_DEPARTMENT_OTHER): Payer: Self-pay | Admitting: *Deleted

## 2012-11-19 NOTE — Progress Notes (Signed)
NPO AFTER MN. ARRIVES AT 0730. NEEDS ISTAT AND EKG.

## 2012-11-25 ENCOUNTER — Ambulatory Visit (HOSPITAL_BASED_OUTPATIENT_CLINIC_OR_DEPARTMENT_OTHER): Payer: No Typology Code available for payment source | Admitting: Anesthesiology

## 2012-11-25 ENCOUNTER — Ambulatory Visit (HOSPITAL_COMMUNITY): Payer: No Typology Code available for payment source

## 2012-11-25 ENCOUNTER — Encounter (HOSPITAL_BASED_OUTPATIENT_CLINIC_OR_DEPARTMENT_OTHER)
Admission: RE | Disposition: A | Discharge status: Home / Self Care | Payer: Self-pay | Source: Ambulatory Visit | Attending: Urology

## 2012-11-25 ENCOUNTER — Encounter (HOSPITAL_BASED_OUTPATIENT_CLINIC_OR_DEPARTMENT_OTHER): Payer: Self-pay | Admitting: *Deleted

## 2012-11-25 ENCOUNTER — Encounter (HOSPITAL_BASED_OUTPATIENT_CLINIC_OR_DEPARTMENT_OTHER): Payer: Self-pay | Admitting: Anesthesiology

## 2012-11-25 ENCOUNTER — Ambulatory Visit (HOSPITAL_BASED_OUTPATIENT_CLINIC_OR_DEPARTMENT_OTHER)
Admission: RE | Admit: 2012-11-25 | Discharge: 2012-11-25 | Disposition: A | Discharge status: Home / Self Care | Payer: No Typology Code available for payment source | Source: Ambulatory Visit | Attending: Urology | Admitting: Urology

## 2012-11-25 DIAGNOSIS — I10 Essential (primary) hypertension: Secondary | ICD-10-CM | POA: Insufficient documentation

## 2012-11-25 DIAGNOSIS — N1330 Unspecified hydronephrosis: Secondary | ICD-10-CM | POA: Insufficient documentation

## 2012-11-25 DIAGNOSIS — E119 Type 2 diabetes mellitus without complications: Secondary | ICD-10-CM | POA: Insufficient documentation

## 2012-11-25 HISTORY — DX: Unspecified hydronephrosis: N13.30

## 2012-11-25 HISTORY — DX: Secondary hyperparathyroidism of renal origin: N25.81

## 2012-11-25 HISTORY — DX: Type 2 diabetes mellitus without complications: E11.9

## 2012-11-25 HISTORY — PX: CYSTOSCOPY W/ URETERAL STENT PLACEMENT: SHX1429

## 2012-11-25 HISTORY — DX: Allergic rhinitis, unspecified: J30.9

## 2012-11-25 HISTORY — DX: Chronic kidney disease, stage 3 unspecified: N18.30

## 2012-11-25 HISTORY — DX: Personal history of urinary calculi: Z87.442

## 2012-11-25 HISTORY — DX: Anemia in chronic kidney disease: D63.1

## 2012-11-25 HISTORY — DX: Chronic kidney disease, stage 3 (moderate): N18.3

## 2012-11-25 HISTORY — DX: Anemia in chronic kidney disease: N18.9

## 2012-11-25 HISTORY — DX: Cyst of kidney, acquired: N28.1

## 2012-11-25 LAB — POCT I-STAT, CHEM 8
Calcium, Ion: 1.22 mmol/L (ref 1.12–1.23)
Creatinine, Ser: 1.4 mg/dL — ABNORMAL HIGH (ref 0.50–1.35)
Glucose, Bld: 257 mg/dL — ABNORMAL HIGH (ref 70–99)
Hemoglobin: 15 g/dL (ref 13.0–17.0)
Potassium: 4.1 mEq/L (ref 3.5–5.1)
TCO2: 26 mmol/L (ref 0–100)

## 2012-11-25 LAB — GLUCOSE, CAPILLARY: Glucose-Capillary: 214 mg/dL — ABNORMAL HIGH (ref 70–99)

## 2012-11-25 SURGERY — CYSTOSCOPY, WITH RETROGRADE PYELOGRAM AND URETERAL STENT INSERTION
Anesthesia: General | Site: Ureter | Laterality: Right | Wound class: Clean Contaminated

## 2012-11-25 MED ORDER — SODIUM CHLORIDE 0.9 % IR SOLN
Status: DC | PRN
  Administered 2012-11-25: 3000 mL

## 2012-11-25 MED ORDER — LIDOCAINE HCL (CARDIAC) 20 MG/ML IV SOLN
INTRAVENOUS | Status: DC | PRN
  Administered 2012-11-25: 100 mg via INTRAVENOUS

## 2012-11-25 MED ORDER — MEPERIDINE HCL 25 MG/ML IJ SOLN
6.2500 mg | INTRAMUSCULAR | Status: DC | PRN
  Filled 2012-11-25: qty 1

## 2012-11-25 MED ORDER — ONDANSETRON HCL 4 MG/2ML IJ SOLN
INTRAMUSCULAR | Status: DC | PRN
  Administered 2012-11-25: 4 mg via INTRAVENOUS

## 2012-11-25 MED ORDER — KETOROLAC TROMETHAMINE 30 MG/ML IJ SOLN
INTRAMUSCULAR | Status: DC | PRN
  Administered 2012-11-25: 30 mg via INTRAVENOUS

## 2012-11-25 MED ORDER — CEFAZOLIN SODIUM 1-5 GM-% IV SOLN
1.0000 g | INTRAVENOUS | Status: DC
  Filled 2012-11-25: qty 50

## 2012-11-25 MED ORDER — IOHEXOL 350 MG/ML SOLN
INTRAVENOUS | Status: DC | PRN
  Administered 2012-11-25: 50 mL

## 2012-11-25 MED ORDER — FENTANYL CITRATE 0.05 MG/ML IJ SOLN
INTRAMUSCULAR | Status: DC | PRN
  Administered 2012-11-25 (×4): 50 ug via INTRAVENOUS

## 2012-11-25 MED ORDER — LACTATED RINGERS IV SOLN
INTRAVENOUS | Status: DC
  Filled 2012-11-25: qty 1000

## 2012-11-25 MED ORDER — LACTATED RINGERS IV SOLN
INTRAVENOUS | Status: DC | PRN
  Administered 2012-11-25 (×3): via INTRAVENOUS

## 2012-11-25 MED ORDER — PROMETHAZINE HCL 25 MG/ML IJ SOLN
6.2500 mg | INTRAMUSCULAR | Status: DC | PRN
  Filled 2012-11-25: qty 1

## 2012-11-25 MED ORDER — DEXTROSE 5 % IV SOLN
3.0000 g | INTRAVENOUS | Status: DC | PRN
  Administered 2012-11-25: 3 g via INTRAVENOUS

## 2012-11-25 MED ORDER — DEXTROSE 5 % IV SOLN
3.0000 g | INTRAVENOUS | Status: DC
  Filled 2012-11-25: qty 3000

## 2012-11-25 MED ORDER — PROPOFOL 10 MG/ML IV BOLUS
INTRAVENOUS | Status: DC | PRN
  Administered 2012-11-25: 250 mg via INTRAVENOUS

## 2012-11-25 MED ORDER — PHENAZOPYRIDINE HCL 200 MG PO TABS
200.0000 mg | ORAL_TABLET | Freq: Once | ORAL | Status: AC
  Administered 2012-11-25: 200 mg via ORAL
  Filled 2012-11-25: qty 1

## 2012-11-25 MED ORDER — MIDAZOLAM HCL 5 MG/5ML IJ SOLN
INTRAMUSCULAR | Status: DC | PRN
  Administered 2012-11-25: 2 mg via INTRAVENOUS

## 2012-11-25 MED ORDER — FENTANYL CITRATE 0.05 MG/ML IJ SOLN
25.0000 ug | INTRAMUSCULAR | Status: DC | PRN
  Filled 2012-11-25: qty 1

## 2012-11-25 MED ORDER — LACTATED RINGERS IV SOLN
INTRAVENOUS | Status: DC
  Administered 2012-11-25: 08:00:00 via INTRAVENOUS
  Filled 2012-11-25: qty 1000

## 2012-11-25 SURGICAL SUPPLY — 18 items
BAG DRAIN URO-CYSTO SKYTR STRL (DRAIN) ×2 IMPLANT
BAG DRN UROCATH (DRAIN) ×1
CANISTER SUCT LVC 12 LTR MEDI- (MISCELLANEOUS) ×2 IMPLANT
CATH INTERMIT  6FR 70CM (CATHETERS) ×1 IMPLANT
CATH URET 5FR 28IN CONE TIP (BALLOONS) ×1
CATH URET 5FR 70CM CONE TIP (BALLOONS) IMPLANT
CLOTH BEACON ORANGE TIMEOUT ST (SAFETY) ×2 IMPLANT
DRAPE CAMERA CLOSED 9X96 (DRAPES) IMPLANT
GLOVE BIO SURGEON STRL SZ7 (GLOVE) ×2 IMPLANT
GLOVE ECLIPSE 6.5 STRL STRAW (GLOVE) ×1 IMPLANT
GLOVE INDICATOR 6.5 STRL GRN (GLOVE) ×1 IMPLANT
GUIDEWIRE 0.038 PTFE COATED (WIRE) IMPLANT
GUIDEWIRE ANG ZIPWIRE 038X150 (WIRE) IMPLANT
GUIDEWIRE STR DUAL SENSOR (WIRE) ×1 IMPLANT
IV NS IRRIG 3000ML ARTHROMATIC (IV SOLUTION) ×2 IMPLANT
NS IRRIG 500ML POUR BTL (IV SOLUTION) IMPLANT
PACK CYSTOSCOPY (CUSTOM PROCEDURE TRAY) ×2 IMPLANT
STENT CONTOUR 8FR X 28 (STENTS) ×1 IMPLANT

## 2012-11-25 NOTE — H&P (Signed)
History of Present Illness  [name] is referred by Dr Ilda Mori. He was found during routine physical examination two months ago to have abnormal renal function. His creatinine was 1.47. He has been complaining of mild to moderate left flank pain on and off and for the past three days the pain has been more severe at night. He has mild discomfort on the right side. Nothing makes it worse or better. CT scan on 6/23 showed severe right hydronephrosis with thinning of the renal parenchyma suggestive of chronic disease, soft tissue density at the right uretero vesical junction, thinning lower pole left kidney and bilateral nephrolithiasis. [name] had right pyeloplasty by Dr Hessie Diener in 1979. I do not have copy of the CT scan.   Past Medical History Problems  1. History of  Diabetes Mellitus 250.00 2. History of  Gout 274.9 3. History of  Hypercholesterolemia 272.0 4. History of  Hypertension 401.9  Surgical History Problems  1. History of  Cystoscopy With Insertion Of Ureteral Stent  Current Meds 1. Allopurinol TABS; Therapy: (Recorded:26Jun2014) to 2. MetFORMIN HCl TABS; Therapy: (Recorded:26Jun2014) to  Allergies Medication  1. Codeine Derivatives  Family History Problems  1. Family history of  Diabetes Mellitus V18.0 2. Family history of  Family Health Status Number Of Children 3. Family history of  Father Deceased At Age ____ 35. Family history of  Mother Deceased At Age 21  Social History Problems    Caffeine Use   Retired From Work   Tobacco Use 305.1 Denied    History of  Alcohol Use  Review of Systems Genitourinary, constitutional, skin, eye, otolaryngeal, hematologic/lymphatic, cardiovascular, pulmonary, endocrine, musculoskeletal, gastrointestinal, neurological and psychiatric system(s) were reviewed and pertinent findings if present are noted.  Genitourinary: nocturia.  Neurological: headache.    Vitals Vital Signs [Data Includes: Last 1 Day]  26Jun2014  11:03AM  BMI Calculated: 37.1 BSA Calculated: 2.37 Height: 5 ft 11 in Weight: 265 lb  Blood Pressure: 166 / 106 Temperature: 98.3 F Heart Rate: 75 Respiration: 18  Physical Exam Constitutional: Well nourished and well developed . No acute distress.  ENT:. The ears and nose are normal in appearance.  Neck: The appearance of the neck is normal and no neck mass is present.  Pulmonary: No respiratory distress and normal respiratory rhythm and effort.  Cardiovascular: Heart rate and rhythm are normal . No peripheral edema.  Abdomen: right flank incision site(s) well healed. The abdomen is obese. The abdomen is soft and nontender. No masses are palpated. No CVA tenderness. No hernias are palpable. No hepatosplenomegaly noted.  Rectal: Rectal exam demonstrates normal sphincter tone, no tenderness and no masses. The prostate has no nodularity and is not tender. The left seminal vesicle is nonpalpable. The right seminal vesicle is nonpalpable. The perineum is normal on inspection.  Genitourinary: Examination of the penis demonstrates no discharge, no masses, no lesions and a normal meatus. The scrotum is without lesions. The right epididymis is palpably normal and non-tender. The left epididymis is palpably normal and non-tender. The right testis is non-tender and without masses. The left testis is non-tender and without masses.  Lymphatics: The femoral and inguinal nodes are not enlarged or tender.  Skin: Normal skin turgor, no visible rash and no visible skin lesions.  Neuro/Psych:. Mood and affect are appropriate.    Results/Data Urine [Data Includes: Last 1 Day]   [id number]  COLOR YELLOW   APPEARANCE CLEAR   SPECIFIC GRAVITY 1.025   pH 6.5   GLUCOSE 100  mg/dL  BILIRUBIN NEG   KETONE NEG mg/dL  BLOOD TRACE   PROTEIN > 300 mg/dL  UROBILINOGEN 0.2 mg/dL  NITRITE NEG   LEUKOCYTE ESTERASE NEG   SQUAMOUS EPITHELIAL/HPF NONE SEEN   WBC NONE SEEN WBC/hpf  RBC 0-2 RBC/hpf  BACTERIA NONE  SEEN   CRYSTALS NONE SEEN   CASTS NONE SEEN    Assessment Assessed  1. Hydronephrosis On The Right 591   The hydronephrosis is probably chronic and may not represent obstruction since patient had pyeloplasty 35 years ago.  However we need to rule that out.  He needs cystoscopy, right retrograde pyelogram and insertion of JJ stent.  He will also need renal scan to assess renal differential function.   Plan Health Maintenance (V70.0)  1. UA With REFLEX  Done: [id number] 10:42AM   Cystoscopy, right retrograde pyelogram and insertion of JJ stent.  The procedure, risks, benefits were explained to the patient.  The risks include but are not limited to hemorrhage, infection, inability to insert stent because of obstruction in which case he would need PCN.  He understands and wishes to proceed.  Will get CD of CT scan for our review.

## 2012-11-25 NOTE — Op Note (Signed)
Luis Joseph. is a 57 y.o.   11/25/2012  General  Preop diagnosis: Right hydronephrosis. Rule out UPJ obstruction  Postop diagnosis: Right hydronephrosis. No definite evidence of UPJ obstruction  Procedure done: Cystoscopy, right retrograde pyelogram, insertion of right double-J stent  Surgeon: Luis Joseph. Luis Joseph  Anesthesia: General  Indication: Patient is a 57 years old male who was found during routine physical examination about 2 months ago to have abnormal renal function. His creatinine was 1.47. He has been complaining of mild to moderate left flank pain on and off. The pain has been more severe at night. He has mild in discomfort on the right side. CT scan on 6/23 revealed severe right hydronephrosis with thinning of the renal parenchyma suggestive of chronic disease, and a soft tissue density at the right ureteral vesicle junction and bilateral nephrolithiasis. In his past history he had a right pyeloplasty in 1979. Because of the severity of the hydronephrosis he was advised then to have a cystoscopy and retrograde pyelogram. The hydronephrosis could have also been chronic secondary to his UPJ obstruction.  Procedure: The patient was identified by his wrist band and proper timeout was taken.  Under general anesthesia he was prepped and draped and placed in the dorsolithotomy position. A panendoscope was inserted in the bladder. The bladder mucosa is normal. There is no stone or tumor in the bladder. The ureteral orifices are in normal position. The intramural ureter appears dilated suggestive of a ureterocele.  Right retrograde pyelogram:  A cone-tip catheter was passed through the cystoscope and the right ureteral orifice. Contrast was then injected through the cone-tip catheter. The distal mid and proximal ureter appear normal. I could not fill the renal pelvis with contrast using the cone-tip catheter. I then removed the cone-tip catheter and passed a sensor wire through the  cystoscope and the right ureter. A #6 [place] open-ended catheter was passed over the sensor wire up to the proximal ureter. Contrast was then injected through the open-ended catheter. The UPJ appears patent. The renal pelvis is markedly dilated. I do not see any definite evidence of obstruction. The sensor wire was then passed through the open-ended catheter and the open-ended catheter was removed.  A #8 French-[id number] double-J stent was then passed over the sensor wire. The proximal curl of the double-J stent is in the renal pelvis. The distal curl is in the bladder. The bladder was then emptied and the cystoscope and sensor wire were removed.  The patient tolerated the procedure well and left the OR in satisfactory condition to postanesthesia care unit  CC: Dr. Ilda Mori

## 2012-11-25 NOTE — Anesthesia Preprocedure Evaluation (Addendum)
Anesthesia Evaluation  Patient identified by MRN, date of birth, ID band Patient awake    Reviewed: Allergy & Precautions, H&P , NPO status , Patient's Chart, lab work & pertinent test results  Airway Mallampati: III TM Distance: >3 FB Neck ROM: Full    Dental no notable dental hx.    Pulmonary neg pulmonary ROS,  breath sounds clear to auscultation  Pulmonary exam normal       Cardiovascular hypertension, Pt. on medications Rhythm:Regular Rate:Normal     Neuro/Psych negative neurological ROS  negative psych ROS   GI/Hepatic negative GI ROS, Neg liver ROS,   Endo/Other  diabetes, Type 2Morbid obesity  Renal/GU negative Renal ROS  negative genitourinary   Musculoskeletal negative musculoskeletal ROS (+)   Abdominal   Peds negative pediatric ROS (+)  Hematology negative hematology ROS (+)   Anesthesia Other Findings Upper front caps  Reproductive/Obstetrics negative OB ROS                          Anesthesia Physical Anesthesia Plan  ASA: III  Anesthesia Plan: General   Post-op Pain Management:    Induction: Intravenous  Airway Management Planned: LMA  Additional Equipment:   Intra-op Plan:   Post-operative Plan:   Informed Consent: I have reviewed the patients History and Physical, chart, labs and discussed the procedure including the risks, benefits and alternatives for the proposed anesthesia with the patient or authorized representative who has indicated his/her understanding and acceptance.   Dental advisory given  Plan Discussed with: CRNA  Anesthesia Plan Comments:         Anesthesia Quick Evaluation

## 2012-11-25 NOTE — Transfer of Care (Signed)
Immediate Anesthesia Transfer of Care Note  Patient: Luis Joseph.  Procedure(s) Performed: Procedure(s) (LRB): CYSTOSCOPY WITH RETROGRADE PYELOGRAM/URETERAL STENT PLACEMENT (Right)  Patient Location: PACU  Anesthesia Type: General  Level of Consciousness: awake, sedated, patient cooperative and responds to stimulation  Airway & Oxygen Therapy: Patient Spontanous Breathing and Patient connected to face mask oxygen  Post-op Assessment: Report given to PACU RN, Post -op Vital signs reviewed and stable and Patient moving all extremities  Post vital signs: Reviewed and stable  Complications: No apparent anesthesia complications

## 2012-11-25 NOTE — Anesthesia Procedure Notes (Signed)
Procedure Name: LMA Insertion Date/Time: 11/25/2012 9:08 AM Performed by: Justice Rocher Pre-anesthesia Checklist: Patient identified, Emergency Drugs available, Suction available and Patient being monitored Patient Re-evaluated:Patient Re-evaluated prior to inductionOxygen Delivery Method: Circle System Utilized Preoxygenation: Pre-oxygenation with 100% oxygen Intubation Type: IV induction Ventilation: Mask ventilation without difficulty LMA: LMA inserted LMA Size: 5.0 Number of attempts: 1 Airway Equipment and Method: bite block Placement Confirmation: positive ETCO2 Tube secured with: Tape Dental Injury: Teeth and Oropharynx as per pre-operative assessment

## 2012-11-26 ENCOUNTER — Encounter (HOSPITAL_BASED_OUTPATIENT_CLINIC_OR_DEPARTMENT_OTHER): Payer: Self-pay | Admitting: Urology

## 2012-11-27 ENCOUNTER — Other Ambulatory Visit: Payer: Self-pay

## 2012-12-03 ENCOUNTER — Telehealth: Payer: Self-pay

## 2012-12-03 ENCOUNTER — Encounter: Payer: Self-pay | Admitting: Family Medicine

## 2012-12-03 ENCOUNTER — Encounter: Payer: Self-pay | Admitting: *Deleted

## 2012-12-03 ENCOUNTER — Ambulatory Visit (INDEPENDENT_AMBULATORY_CARE_PROVIDER_SITE_OTHER): Payer: No Typology Code available for payment source | Admitting: Family Medicine

## 2012-12-03 VITALS — BP 180/110 | HR 74 | Temp 98.5°F | Wt 274.8 lb

## 2012-12-03 DIAGNOSIS — I10 Essential (primary) hypertension: Secondary | ICD-10-CM

## 2012-12-03 MED ORDER — CLONIDINE HCL 0.1 MG PO TABS: 0.1000 mg | ORAL_TABLET | Freq: Two times a day (BID) | ORAL | Status: DC

## 2012-12-03 NOTE — Telephone Encounter (Signed)
Pt walked in with elevated BP for weeks and questions about FMLA form; pt has seen nephrologist and urologist and at office visits pts BP has been elevated 160-180/100s. Pt wants to see Dr Damita Dunnings only about BP issue; no CP, SOB, H/A or dizziness. Pt scheduled appt today at 3:45 to see Dr Damita Dunnings and will discuss with Dr Damita Dunnings if he can do FMLA form or should specialist. Advised pt if his condition changes or worsens to call back prior to appt. Pt voiced understanding. FMLA form in Dr Josefine Class in box.

## 2012-12-03 NOTE — Telephone Encounter (Signed)
Will see today, will look at the [facility] form.

## 2012-12-03 NOTE — Patient Instructions (Addendum)
Take the clonidine twice a day.  Let me know about your BP in a few days.   Go to the lab on the way out.  We'll contact you with your lab report.  Take care. Glad to see you.

## 2012-12-03 NOTE — Progress Notes (Signed)
BP prev reasonably controlled at OV. In interval, hydronephrosis noted with inc in Cr.  Cr improved, CT abd done per renal and then ureteral stent placed by uro.  Hematuria after procedure resolved.  No dysuria now.  Still with elevated BPs w/o CP, SOB, BLE edema.   PMH and SH reviewed  ROS: See HPI, otherwise noncontributory.  Meds, vitals, and allergies reviewed.   GEN: nad, alert and oriented HEENT: mucous membranes moist NECK: supple w/o LA CV: rrr. PULM: ctab, no inc wob ABD: soft, +bs EXT: no edema SKIN: no acute rash

## 2012-12-04 LAB — BASIC METABOLIC PANEL
Calcium: 10.1 mg/dL (ref 8.4–10.5)
Creatinine, Ser: 1.6 mg/dL — ABNORMAL HIGH (ref 0.4–1.5)
GFR: 46.94 mL/min — ABNORMAL LOW (ref >60.00)
Sodium: 137 mEq/L (ref 135–145)

## 2012-12-04 NOTE — Assessment & Plan Note (Signed)
Has been out of work from ~11/17/12 to now with hematuria, elevated BP, mult MD visits and procedures.  FMLA forms done.  Recheck labs today, see notes on labs. Add on clonidine for now and he'll update me with his BP.  He agrees.  >25 min spent with face to face with patient, >50% counseling and/or coordinating care.

## 2012-12-08 ENCOUNTER — Ambulatory Visit (INDEPENDENT_AMBULATORY_CARE_PROVIDER_SITE_OTHER): Payer: No Typology Code available for payment source | Admitting: Family Medicine

## 2012-12-08 ENCOUNTER — Encounter: Payer: Self-pay | Admitting: Family Medicine

## 2012-12-08 ENCOUNTER — Telehealth: Payer: Self-pay

## 2012-12-08 VITALS — BP 166/106 | HR 78 | Temp 97.9°F | Wt 278.5 lb

## 2012-12-08 DIAGNOSIS — R7989 Other specified abnormal findings of blood chemistry: Secondary | ICD-10-CM

## 2012-12-08 DIAGNOSIS — I10 Essential (primary) hypertension: Secondary | ICD-10-CM

## 2012-12-08 DIAGNOSIS — R799 Abnormal finding of blood chemistry, unspecified: Secondary | ICD-10-CM

## 2012-12-08 MED ORDER — CLONIDINE HCL 0.1 MG PO TABS: 0.2000 mg | ORAL_TABLET | Freq: Two times a day (BID) | ORAL | Status: DC

## 2012-12-08 NOTE — Assessment & Plan Note (Signed)
Inc clonidine to 0.2 mg po BID and he'll report back with BP.  Okay for outpatient f/u. He agrees.

## 2012-12-08 NOTE — Telephone Encounter (Signed)
See OV note.  

## 2012-12-08 NOTE — Progress Notes (Signed)
Hypertension:    Using medication without problems or lightheadedness: yes Chest pain with exertion:no Edema:no Short of breath:no BP had been elevated on home checks.  We talked about his cuff- it may read inappropriately high.  He's on the clonidine 0.1 po BID  His bleeding resolving- it had returned as soon as he went to work recently (twice)- he is seeing Dr. Janice Norrie tomorrow. Stent is still in place.  FMLA was updated and given patient to patient.   Meds, vitals, and allergies reviewed.   ROS: See HPI.  Otherwise negative.    GEN: nad, alert and oriented HEENT: mucous membranes moist NECK: supple w/o LA CV: rrr. PULM: ctab, no inc wob ABD: soft, +bs EXT: no edema SKIN: no acute rash

## 2012-12-08 NOTE — Telephone Encounter (Signed)
Pt seen 12/03/12; started clonidine on 12/05/12 and started taking bp on 12/07/12; BP cuff has not been used in long time; pts reading 200/130. This AM 201/130/ pt has no h/a, dizziness ,CP or SOB. Also needs revision of date to return to work on [employer]. Pt scheduled to see Dr Damita Dunnings today at 4:15 pm, will bring BP cuff and FMLA paperwork If pt condition were to change or worsen prior to appt pt will cb.

## 2012-12-08 NOTE — Assessment & Plan Note (Signed)
He'll f/u with uro about the hematuria.

## 2012-12-08 NOTE — Patient Instructions (Addendum)
Take two clonidine tabs two times a day.  If needed cut back to one tab three times a day.  I'll await the urology notes.  Take care.  Get another BP cuff and update me in a few days.

## 2012-12-16 ENCOUNTER — Telehealth: Payer: Self-pay

## 2012-12-16 NOTE — Telephone Encounter (Signed)
Ryan with [employer] leave and short term disability dept left v/m with several questions about pt being out of work; left v/m for [employer] on a secure line to fax any questions and info needed to [phone].

## 2012-12-17 ENCOUNTER — Telehealth: Payer: Self-pay

## 2012-12-17 NOTE — Telephone Encounter (Signed)
I don't believe I've gotten anything in the meantime.

## 2012-12-17 NOTE — Telephone Encounter (Signed)
Ryan with [employer] left v/m wanted to know if disability form that was faxed had been received.Please advise.

## 2012-12-18 NOTE — Anesthesia Postprocedure Evaluation (Signed)
  Anesthesia Post-op Note  Patient: Luis Joseph.  Procedure(s) Performed: Procedure(s) (LRB): CYSTOSCOPY WITH RETROGRADE PYELOGRAM/URETERAL STENT PLACEMENT (Right)  Patient Location: PACU  Anesthesia Type: General  Level of Consciousness: awake and alert   Airway and Oxygen Therapy: Patient Spontanous Breathing  Post-op Pain: mild  Post-op Assessment: Post-op Vital signs reviewed, Patient's Cardiovascular Status Stable, Respiratory Function Stable, Patent Airway and No signs of Nausea or vomiting  Last Vitals:  Filed Vitals:   11/25/12 1130  BP: 170/92  Pulse: 76  Temp:   Resp: 16    Post-op Vital Signs: stable   Complications: No apparent anesthesia complications

## 2012-12-18 NOTE — Telephone Encounter (Signed)
Left v/m for [employer] But[employer]; have not received the disability forms and request refaxed to [phone] [address] [place].

## 2012-12-23 ENCOUNTER — Other Ambulatory Visit: Payer: Self-pay

## 2012-12-23 MED ORDER — CLONIDINE HCL 0.1 MG PO TABS: 0.3000 mg | ORAL_TABLET | Freq: Two times a day (BID) | ORAL | Status: DC

## 2012-12-23 NOTE — Telephone Encounter (Signed)
Left detailed message on voicemail.  

## 2012-12-23 NOTE — Telephone Encounter (Signed)
I would increase to 3 tabs BID.  rx sent.  Will check BP at the OV.  Have him check BP at home.  If running low (<130/<90) on the higher dose, then cut back to 2 tabs BID.  Thanks.

## 2012-12-23 NOTE — Telephone Encounter (Signed)
Pt left v/m requesting refill clonidine 0.1 mg pt taking 2 tabs twice a day to [employer]. I left v/m for pt to cb to get update on BP readings since pt seen 12/08/12.

## 2012-12-23 NOTE — Telephone Encounter (Signed)
Pt just got new BP cuff and BP has been in range of 150's -160s / 110. No symptoms of h/a, dizziness,CP or SOB.Please advise. Pt has f/u appt with Dr Damita Dunnings 12/31/12. Pt request cb.

## 2012-12-25 ENCOUNTER — Other Ambulatory Visit (INDEPENDENT_AMBULATORY_CARE_PROVIDER_SITE_OTHER): Payer: No Typology Code available for payment source

## 2012-12-25 DIAGNOSIS — E119 Type 2 diabetes mellitus without complications: Secondary | ICD-10-CM

## 2012-12-26 ENCOUNTER — Encounter: Payer: Self-pay | Admitting: Family Medicine

## 2012-12-26 ENCOUNTER — Telehealth: Payer: Self-pay

## 2012-12-26 NOTE — Telephone Encounter (Signed)
Pt brought by form for weight loss program (surgery) with [employer];form in Dr Josefine Class in box. Pt has appt 12/31/12 to see Dr Damita Dunnings and will bring additional form that needs to be completed also; Pt also wanted Dr Damita Dunnings to know  Pt saw kidney dr and has 2 embedded stones in rt kidney; pt to have test to determine what type kidney surgery pt will have and Kidney dr told pt he will talk with Dr Hassell Done at [place] about doing kidney and weight loss surgery at same time.

## 2012-12-28 NOTE — Telephone Encounter (Signed)
Noted, letter done. Thanks.  Scan hard copy and send letter.

## 2012-12-29 ENCOUNTER — Other Ambulatory Visit (HOSPITAL_COMMUNITY): Payer: Self-pay | Admitting: Urology

## 2012-12-29 DIAGNOSIS — N200 Calculus of kidney: Secondary | ICD-10-CM

## 2012-12-31 ENCOUNTER — Ambulatory Visit (INDEPENDENT_AMBULATORY_CARE_PROVIDER_SITE_OTHER): Payer: No Typology Code available for payment source | Admitting: Family Medicine

## 2012-12-31 ENCOUNTER — Encounter: Payer: Self-pay | Admitting: Family Medicine

## 2012-12-31 VITALS — BP 130/88 | HR 80 | Temp 98.8°F | Wt 275.8 lb

## 2012-12-31 DIAGNOSIS — R7989 Other specified abnormal findings of blood chemistry: Secondary | ICD-10-CM

## 2012-12-31 DIAGNOSIS — I10 Essential (primary) hypertension: Secondary | ICD-10-CM

## 2012-12-31 DIAGNOSIS — R799 Abnormal finding of blood chemistry, unspecified: Secondary | ICD-10-CM

## 2012-12-31 DIAGNOSIS — E1165 Type 2 diabetes mellitus with hyperglycemia: Secondary | ICD-10-CM

## 2012-12-31 MED ORDER — CLONIDINE HCL 0.1 MG PO TABS: 0.2500 mg | ORAL_TABLET | Freq: Two times a day (BID) | ORAL | Status: DC

## 2012-12-31 NOTE — Progress Notes (Signed)
Hypertension:    Using medication without problems or lightheadedness: yes Chest pain with exertion:no Edema:no Short of breath:no Average home [id number] on current dose but dry mouth with meds, on higher dose of clonidine.    Blood in urine, still occuring.  Worse after cutting grass. Renal studies are pending to see about unilateral vs bilateral function.  He has some midline scrotal pain, not testicular pain, that may be referred.    DM2.  He is looking into bariatric surgery.  This may happen concurrently; it will be up to uro, surgery and patient.  A1c d/w pt.  Still on metformin.    Meds, vitals, and allergies reviewed.   PMH and SH reviewed  ROS: See HPI.  Otherwise negative.    GEN: nad, alert and oriented HEENT: mucous membranes slightly dry NECK: supple w/o LA CV: rrr. PULM: ctab, no inc wob ABD: soft, +bs EXT: no edema SKIN: no acute rash

## 2012-12-31 NOTE — Telephone Encounter (Signed)
Completed paperwork and letter faxed back to [employer] and sent to be scanned into chart as instructed.

## 2012-12-31 NOTE — Patient Instructions (Addendum)
Take 2.5 tabs of clonidine twice a day for now.  If needed, then go back to 3 tabs twice a day.  I'll await the urology and surgery decisions.

## 2013-01-01 NOTE — Assessment & Plan Note (Signed)
BP improved but will cut back to 2.5mg  clonidine BID due to dry mouth.  He agrees.  Off ACE in meantime with renal/uro f/u pending.

## 2013-01-01 NOTE — Assessment & Plan Note (Signed)
With hematuria eval in progress per uro.

## 2013-01-01 NOTE — Assessment & Plan Note (Signed)
Continue as is for now, until bariatric plans are made.  He agrees. We can set f/u after he has seen surgery again.

## 2013-01-04 ENCOUNTER — Other Ambulatory Visit: Payer: Self-pay | Admitting: Family Medicine

## 2013-01-06 ENCOUNTER — Encounter (HOSPITAL_COMMUNITY)
Admission: RE | Admit: 2013-01-06 | Discharge: 2013-01-06 | Disposition: A | Discharge status: Home / Self Care | Payer: No Typology Code available for payment source | Source: Ambulatory Visit | Attending: Urology | Admitting: Urology

## 2013-01-06 DIAGNOSIS — N183 Chronic kidney disease, stage 3 unspecified: Secondary | ICD-10-CM | POA: Insufficient documentation

## 2013-01-06 DIAGNOSIS — N1330 Unspecified hydronephrosis: Secondary | ICD-10-CM | POA: Insufficient documentation

## 2013-01-06 DIAGNOSIS — N200 Calculus of kidney: Secondary | ICD-10-CM

## 2013-01-06 MED ORDER — TECHNETIUM TC 99M MERTIATIDE
16.3000 | Freq: Once | INTRAVENOUS | Status: AC | PRN
  Administered 2013-01-06: 16 via INTRAVENOUS

## 2013-01-06 MED ORDER — FUROSEMIDE 10 MG/ML IJ SOLN
60.0000 mg | Freq: Once | INTRAMUSCULAR | Status: AC
  Administered 2013-01-06: 60 mg via INTRAVENOUS
  Filled 2013-01-06: qty 6

## 2013-01-14 ENCOUNTER — Other Ambulatory Visit (INDEPENDENT_AMBULATORY_CARE_PROVIDER_SITE_OTHER): Payer: Self-pay

## 2013-01-14 ENCOUNTER — Ambulatory Visit (INDEPENDENT_AMBULATORY_CARE_PROVIDER_SITE_OTHER): Payer: No Typology Code available for payment source | Admitting: Surgery

## 2013-01-14 ENCOUNTER — Encounter (INDEPENDENT_AMBULATORY_CARE_PROVIDER_SITE_OTHER): Payer: Self-pay | Admitting: Surgery

## 2013-01-14 VITALS — BP 152/78 | HR 68 | Temp 97.3°F | Resp 20 | Ht 71.0 in | Wt 273.6 lb

## 2013-01-14 DIAGNOSIS — E669 Obesity, unspecified: Secondary | ICD-10-CM

## 2013-01-14 DIAGNOSIS — E1165 Type 2 diabetes mellitus with hyperglycemia: Secondary | ICD-10-CM

## 2013-01-14 NOTE — Progress Notes (Signed)
Chief Complaint:  Morbid obesity with multiple comorbidities including type 2 diabetes and hypertension  History of Present Illness:  Luis Joseph. 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 Damita Dunnings at [facility].  He currently works part-time at [employer]. After researching this extensively and after attending a seminar he was interested in a Roux-en-Y gastric bypass. He had seen his urologist, Dr. Tresa Moore who stated that because of the chronic problems he's had with his right kidney that he might not recommend a Roux-en-Y gastric bypass. I'll discuss this with Dr. Tresa Moore is a not certain that that should be an absolute contraindication. He does have problems with recurrent stones on the right side and currently has a stent in place. He had procedures for stones about 30 years ago and was recently found to have some large stones in his right calyx.  He has had type 2 diabetes for about 2 years. We'll go ahead and begin the workup for laparoscopic Roux-en-Y gastric bypass although we have discussed sleeve and the bypass.  Past Medical History  Diagnosis Date  . Hyperlipidemia 05/21/1988  . Hypertension 05/21/1978  . Gout 05/21/1985  . GERD (gastroesophageal reflux disease) 05/21/1978  . Migraines     Better after retirement  . Diabetes mellitus, type 2   . History of kidney stones   . Hyperparathyroidism, secondary renal   . Anemia in chronic kidney disease   . Allergic rhinitis   . Hydronephrosis, right   . Renal cyst, left   . CKD (chronic kidney disease), stage III     NEPHROLOGIST-  DR Lavonia Dana    Past Surgical History  Procedure Laterality Date  . Partial nephrectomy  12/19/1977    Due to cyst/stone removal  . Cystoscopy w/ ureteral stent placement Right 11/25/2012    Procedure: CYSTOSCOPY WITH RETROGRADE PYELOGRAM/URETERAL STENT PLACEMENT;  Surgeon: Hanley Ben, MD;  Location: [place];   Service: Urology;  Laterality: Right;    Current Outpatient Prescriptions  Medication Sig Dispense Refill  . allopurinol (ZYLOPRIM) 100 MG tablet Take one tablet by mouth one time daily  90 tablet  3  . allopurinol (ZYLOPRIM) 300 MG tablet Take 1 tablet (300 mg total) by mouth daily.  90 tablet  3  . Cinnamon 500 MG capsule Take 500 mg by mouth 2 (two) times daily.      . cloNIDine (CATAPRES) 0.1 MG tablet Take 2.5 tablets (0.25 mg total) by mouth 2 (two) times daily.      . Coenzyme Q10 (COQ-10 PO) Take 300 mg by mouth daily.      . colchicine 0.6 MG tablet       . desonide (DESOWEN) 0.05 % lotion Apply topically 2 (two) times daily as needed. Do not use on the face.  59 mL  2  . fexofenadine (ALLEGRA) 180 MG tablet Take 180 mg by mouth daily.      . magnesium gluconate (MAGONATE) 500 MG tablet Take 500 mg by mouth daily.      . metFORMIN (GLUCOPHAGE) 500 MG tablet Take 500 mg by mouth 2 (two) times daily with a meal.       . metoprolol succinate (TOPROL-XL) 100 MG 24 hr tablet Take 100 mg by mouth every evening. Take with or immediately following a meal.      . Multiple Vitamin (MULTIVITAMIN) tablet Take 1 tablet by mouth daily.        Marland Kitchen  niacin 500 MG tablet Take 500 mg by mouth 2 (two) times daily with a meal.        . SUMAtriptan-naproxen (TREXIMET) 85-500 MG per tablet Take 1 tablet by mouth every 2 (two) hours as needed. TAKE ONE TABLET BY MOUTH AT ONSET OF HEADACHE, MAY REPEAT ONCE IN 2 HOURS IF NEEDED      . zinc gluconate 50 MG tablet Take 50 mg by mouth daily.       No current facility-administered medications for this visit.   Codeine sulfate and Sildenafil citrate Family History  Problem Relation Age of Onset  . Heart disease Mother   . Diabetes Mother   . Hypertension Mother   . Cancer Father     Lung  . Diabetes Sister   . Heart disease Maternal Grandfather   . Diabetes Maternal Grandfather   . Stroke Neg Hx   . Prostate cancer Neg Hx   . Colon cancer Neg Hx     Social History:   reports that he quit smoking about 35 years ago. He quit smokeless tobacco use about 35 years ago. He reports that he does not drink alcohol or use illicit drugs.   REVIEW OF SYSTEMS - PERTINENT POSITIVES ONLY: Positive for migraines although he has not had one recently. No history of heart disease chest pain or breathing problems. Denies history of reflux. Negative history of diarrhea. No history of DVT. Review of systems otherwise negative.  Physical Exam:   Blood pressure 152/78, pulse 68, temperature 97.3 F (36.3 C), temperature source Oral, resp. rate 20, height 5\' 11"  (1.803 m), weight 273 lb 9.6 oz (124.104 kg). Body mass index is 38.18 kg/(m^2).  Gen:  WDWN white male NAD  Neurological: Alert and oriented to person, place, and time. Motor and sensory function is grossly intact  Head: Normocephalic and atraumatic.  Eyes: Conjunctivae are normal. Pupils are equal, round, and reactive to light. No scleral icterus.  Neck: Normal range of motion. Neck supple. No tracheal deviation or thyromegaly present.  Cardiovascular:  SR without murmurs or gallops.  No carotid bruits Respiratory: Effort normal.  No respiratory distress. No chest wall tenderness. Breath sounds normal.  No wheezes, rales or rhonchi.  Abdomen:  Mildly obese nontender no rebound guarding. No prior upper abdominal surgery. He has had a right flank incision from his kidney surgery. GU: Musculoskeletal: Normal range of motion. Extremities are nontender. No cyanosis, edema or clubbing noted Lymphadenopathy: No cervical, preauricular, postauricular or axillary adenopathy is present Skin: Skin is warm and dry. No rash noted. No diaphoresis. No erythema. No pallor. Pscyh: Normal mood and affect. Behavior is normal. Judgment and thought content normal.   LABORATORY RESULTS: No results found for this or any previous visit (from the past 48 hour(s)).  RADIOLOGY RESULTS: No results found.  Problem  List: Patient Active Problem List   Diagnosis Date Noted  . Routine general medical examination at a health care facility 09/29/2012  . Elevated serum creatinine 09/29/2012  . Migraine, unspecified, without mention of intractable migraine without mention of status migrainosus 08/04/2012  . Erectile dysfunction 07/25/2011  . Postnasal drip 07/25/2011  . Muscle cramp, nocturnal 07/25/2011  . Vertigo 07/25/2011  . FATIGUE 06/06/2010  . Diabetes type 2, uncontrolled 03/17/2010  . SKIN LESION 03/17/2010  . DERMATITIS 10/20/2009  . ANXIETY 06/30/2008  . UNSPECIFIED SLEEP DISTURBANCE 06/10/2008  . OBESITY, UNSPECIFIED 04/21/2007  . COUGH 08/27/2006  . HYPERLIPIDEMIA 05/21/1988  . GOUT 05/21/1985  . HYPERTENSION 05/21/1978  .  GERD 05/21/1978  . HEADACHE, CLUSTER 05/21/1976    Assessment & Plan: BMI of 38 with diabetes and hypertension. Will begin down the workup for Roux-en-Y gastric bypass. We'll consider sleeve gastrectomy as an alternative.    Matt B. Hassell Done, MD, [facility] [phone] beeper [phone]  01/14/2013 10:36 AM

## 2013-01-14 NOTE — Patient Instructions (Signed)
Gastric Bypass Surgery Care After Refer to this sheet in the next few weeks. These discharge instructions provide you with general information on caring for yourself after you leave the hospital. Your caregiver may also give you specific instructions. Your treatment has been planned according to the most current medical practices available, but unavoidable complications sometimes occur. If you have any problems or questions after discharge, call your caregiver. HOME CARE INSTRUCTIONS  Activity  Take frequent walks throughout the day. This will help to prevent blood clots. Do not sit for longer than 45 minutes to 1 hour while awake for 4 to 6 weeks after surgery.  Continue to do coughing and deep breathing exercises once you get home. This will help to prevent pneumonia.  Do not do strenuous activities, such as heavy lifting, pushing, or pulling, until after your follow-up visit with your caregiver. Do not lift anything heavier than 10 lb (4.5 kg).  Talk with your caregiver about when you may return to work and your exercise routine.  Do not drive while taking prescription pain medicine. Nutrition  It is very important that you drink at least 80 oz (2,400 mL) of fluid a day.  You should stay on a clear liquid diet until your follow-up visit with your caregiver. Keep sugar-free, clear liquid items on hand, including:  Tea: hot or cold. Drink only decaffeinated for the first month.  Broths: clear beef, chicken, vegetable.  Others: water, sugar-free frozen ice pops, flavored water, gelatin (after 1 week).  Do not consume caffeine for 1 month. Large amounts of caffeine can cause dehydration.  A dietician may also give you specific instructions.  Follow your caregiver's recommendations about vitamins and protein requirements after surgery. Hygiene  You may shower and wash your hair 2 days after surgery. Pat incisions dry. Do not rub incisions with a washcloth or towel.  Follow your  caregiver's recommendations about baths and pools following surgery. Pain control  If a prescription medicine was given, follow your caregiver's directions.  You may feel some gas pain caused by the carbon dioxide used to inflate your abdomen during surgery. This pain can be felt in your chest, shoulder, back, or abdominal area. Moving around often is advised. Incision care  You may have 4 or more small incisions. They are closed with skin adhesive strips. Skin adhesive strips can get wet and will fall off on their own. Check your incisions and surrounding area daily for any redness, swelling, discoloration, fluid (drainage), or bleeding. Dark red, dried blood may appear under these coverings. This is normal.  If you have a drain, it will be removed at your follow-up visit or before you leave the hospital.  If your drain is left in, follow your caregiver's instructions on drain care.  If your drain is taken out, keep a clean, dry bandage over the drain site. SEEK MEDICAL CARE IF:   You develop persistent nausea and vomiting.  You have pain and discomfort with swallowing.  You have pain, swelling, or warmth in the lower extremities.  You have an oral temperature above 102 F (38.9 C).  You develop chills.  Your incision sites look red, swollen, or have drainage.  Your stool is black, tarry, or maroon in color.  You are lightheaded when standing.  You notice a bruise getting larger.  You have any questions or concerns. SEEK IMMEDIATE MEDICAL CARE IF:   You have chest pain.  You have severe calf pain or pain not relieved by medicine.  You  develop shortness of breath or difficulty breathing.  There is bright red blood coming from the drain.  You feel confused.  You have slurred speech.  You suddenly feel weak. MAKE SURE YOU:   Understand these instructions.  Will watch your condition.  Will get help right away if you are not doing well or get worse. Document  Released: 12/20/2003 Document Revised: 07/30/2011 Document Reviewed: 09/27/2009 [facility] Patient Information 2014 [place].  Two weeks prior to surgery  Go on the extremely low carb liquid diet One week prior to surgery  No aspirin products.  Tylenol is acceptable  Stop smoking 24 hours prior to surgery  No alcoholic beverages  Report fever greater than 100.5 or excessive nasal drainage suggesting infection  Continue bariatric preop diet  Perform bowel prep if ordered  Do not eat or drink anything after midnight the night before surgery  Do not take any medications except those instructed by the anesthesiologist Morning of surgery  Please arrive at the hospital at least 2 hours before your scheduled surgery time.  No makeup, fingernail polish or jewelry  Bring insurance cards with you  Bring your CPAP mask if you use this

## 2013-01-16 ENCOUNTER — Ambulatory Visit (INDEPENDENT_AMBULATORY_CARE_PROVIDER_SITE_OTHER): Payer: Self-pay | Admitting: Surgery

## 2013-01-20 ENCOUNTER — Ambulatory Visit (HOSPITAL_COMMUNITY)
Admission: RE | Admit: 2013-01-20 | Discharge: 2013-01-20 | Disposition: A | Discharge status: Home / Self Care | Payer: No Typology Code available for payment source | Source: Ambulatory Visit | Attending: Surgery | Admitting: Surgery

## 2013-01-20 DIAGNOSIS — K219 Gastro-esophageal reflux disease without esophagitis: Secondary | ICD-10-CM | POA: Insufficient documentation

## 2013-01-20 DIAGNOSIS — E119 Type 2 diabetes mellitus without complications: Secondary | ICD-10-CM | POA: Insufficient documentation

## 2013-01-20 DIAGNOSIS — K7689 Other specified diseases of liver: Secondary | ICD-10-CM | POA: Insufficient documentation

## 2013-01-20 DIAGNOSIS — N200 Calculus of kidney: Secondary | ICD-10-CM | POA: Insufficient documentation

## 2013-01-20 DIAGNOSIS — D649 Anemia, unspecified: Secondary | ICD-10-CM | POA: Insufficient documentation

## 2013-01-20 DIAGNOSIS — Z6838 Body mass index (BMI) 38.0-38.9, adult: Secondary | ICD-10-CM | POA: Insufficient documentation

## 2013-01-20 DIAGNOSIS — I129 Hypertensive chronic kidney disease with stage 1 through stage 4 chronic kidney disease, or unspecified chronic kidney disease: Secondary | ICD-10-CM | POA: Insufficient documentation

## 2013-01-20 DIAGNOSIS — E785 Hyperlipidemia, unspecified: Secondary | ICD-10-CM | POA: Insufficient documentation

## 2013-01-20 DIAGNOSIS — Q619 Cystic kidney disease, unspecified: Secondary | ICD-10-CM | POA: Insufficient documentation

## 2013-01-20 DIAGNOSIS — E213 Hyperparathyroidism, unspecified: Secondary | ICD-10-CM | POA: Insufficient documentation

## 2013-01-20 DIAGNOSIS — N183 Chronic kidney disease, stage 3 unspecified: Secondary | ICD-10-CM | POA: Insufficient documentation

## 2013-02-03 ENCOUNTER — Encounter: Payer: No Typology Code available for payment source | Attending: Surgery | Admitting: Dietician

## 2013-02-03 ENCOUNTER — Encounter: Payer: Self-pay | Admitting: Dietician

## 2013-02-03 VITALS — Ht 71.0 in | Wt 274.7 lb

## 2013-02-03 DIAGNOSIS — Z713 Dietary counseling and surveillance: Secondary | ICD-10-CM | POA: Insufficient documentation

## 2013-02-03 DIAGNOSIS — E669 Obesity, unspecified: Secondary | ICD-10-CM | POA: Insufficient documentation

## 2013-02-03 NOTE — Patient Instructions (Addendum)
Call [facility] to schedule Pre-Op Class when surgery is scheduled.

## 2013-02-03 NOTE — Progress Notes (Signed)
  Pre-Op Assessment Visit:  Pre-Operative Bariatric Surgery  Medical Nutrition Therapy:  Appt start time: [id number]   End time:  1130.  Patient was seen on 02/03/2013 for Pre-Operative RYGB or Sleeve Gastrectomy Nutrition Assessment. Assessment and letter of approval faxed to [facility] Bariatric Surgery Program coordinator on 02/03/2013.   Handouts given during visit include:  Pre-Op Goals Bariatric Surgery Protein Shakes  Patient to call the [facility] to enroll in Pre-Op and Post-Op Nutrition Education when surgery date is scheduled.

## 2013-02-04 ENCOUNTER — Encounter (HOSPITAL_BASED_OUTPATIENT_CLINIC_OR_DEPARTMENT_OTHER): Payer: Self-pay | Admitting: *Deleted

## 2013-02-04 ENCOUNTER — Other Ambulatory Visit: Payer: Self-pay | Admitting: Urology

## 2013-02-04 NOTE — Progress Notes (Signed)
NPO AFTER MN WITH EXCEPTION CLEAR LIQUIDS UNTIL 0730. ARRIVES AT 1200. NEEDS ISTAT. CURRENT EKG IN EPIC AND CHART. WILL TAKE METOPROLOL AND CLONIDINE AM OF SURG W/ SIP OF WATER.

## 2013-02-04 NOTE — Progress Notes (Signed)
02/04/13 1443  OBSTRUCTIVE SLEEP APNEA  Have you ever been diagnosed with sleep apnea through a sleep study? No  Do you snore loudly (loud enough to be heard through closed doors)?  0  Do you often feel tired, fatigued, or sleepy during the daytime? 0  Has anyone observed you stop breathing during your sleep? 0  Do you have, or are you being treated for high blood pressure? 1  BMI more than 35 kg/m2? 1  Age over 59 years old? 1  Neck circumference greater than 40 cm/18 inches? 0  Gender: 1  Obstructive Sleep Apnea Score 4  Score 4 or greater  Results sent to PCP

## 2013-02-10 ENCOUNTER — Ambulatory Visit (HOSPITAL_BASED_OUTPATIENT_CLINIC_OR_DEPARTMENT_OTHER)
Admission: RE | Admit: 2013-02-10 | Discharge: 2013-02-10 | Disposition: A | Discharge status: Home / Self Care | Payer: No Typology Code available for payment source | Source: Ambulatory Visit | Attending: Urology | Admitting: Urology

## 2013-02-10 ENCOUNTER — Ambulatory Visit (HOSPITAL_BASED_OUTPATIENT_CLINIC_OR_DEPARTMENT_OTHER): Payer: No Typology Code available for payment source | Admitting: Anesthesiology

## 2013-02-10 ENCOUNTER — Encounter (HOSPITAL_BASED_OUTPATIENT_CLINIC_OR_DEPARTMENT_OTHER): Payer: Self-pay

## 2013-02-10 ENCOUNTER — Encounter (HOSPITAL_BASED_OUTPATIENT_CLINIC_OR_DEPARTMENT_OTHER): Payer: Self-pay | Admitting: Anesthesiology

## 2013-02-10 ENCOUNTER — Encounter (HOSPITAL_BASED_OUTPATIENT_CLINIC_OR_DEPARTMENT_OTHER)
Admission: RE | Disposition: A | Discharge status: Home / Self Care | Payer: Self-pay | Source: Ambulatory Visit | Attending: Urology

## 2013-02-10 ENCOUNTER — Ambulatory Visit (HOSPITAL_COMMUNITY): Payer: No Typology Code available for payment source

## 2013-02-10 DIAGNOSIS — N201 Calculus of ureter: Secondary | ICD-10-CM | POA: Insufficient documentation

## 2013-02-10 DIAGNOSIS — N1330 Unspecified hydronephrosis: Secondary | ICD-10-CM | POA: Insufficient documentation

## 2013-02-10 DIAGNOSIS — N183 Chronic kidney disease, stage 3 unspecified: Secondary | ICD-10-CM | POA: Insufficient documentation

## 2013-02-10 DIAGNOSIS — N200 Calculus of kidney: Secondary | ICD-10-CM | POA: Insufficient documentation

## 2013-02-10 DIAGNOSIS — I129 Hypertensive chronic kidney disease with stage 1 through stage 4 chronic kidney disease, or unspecified chronic kidney disease: Secondary | ICD-10-CM | POA: Insufficient documentation

## 2013-02-10 DIAGNOSIS — E119 Type 2 diabetes mellitus without complications: Secondary | ICD-10-CM | POA: Insufficient documentation

## 2013-02-10 DIAGNOSIS — E785 Hyperlipidemia, unspecified: Secondary | ICD-10-CM | POA: Insufficient documentation

## 2013-02-10 HISTORY — DX: Hematuria, unspecified: R31.9

## 2013-02-10 HISTORY — PX: CYSTOSCOPY/RETROGRADE/URETEROSCOPY: SHX5316

## 2013-02-10 LAB — POCT I-STAT, CHEM 8
BUN: 24 mg/dL — ABNORMAL HIGH (ref 6–23)
Calcium, Ion: 1.04 mmol/L — ABNORMAL LOW (ref 1.12–1.23)
Chloride: 104 mEq/L (ref 96–112)
Creatinine, Ser: 1.6 mg/dL — ABNORMAL HIGH (ref 0.50–1.35)
Glucose, Bld: 325 mg/dL — ABNORMAL HIGH (ref 70–99)
HCT: 41 % (ref 39.0–52.0)
TCO2: 24 mmol/L (ref 0–100)

## 2013-02-10 LAB — GLUCOSE, CAPILLARY: Glucose-Capillary: 318 mg/dL — ABNORMAL HIGH (ref 70–99)

## 2013-02-10 SURGERY — CYSTOSCOPY/RETROGRADE/URETEROSCOPY
Anesthesia: General | Site: Ureter | Laterality: Right | Wound class: Clean Contaminated

## 2013-02-10 MED ORDER — MIDAZOLAM HCL 5 MG/5ML IJ SOLN
INTRAMUSCULAR | Status: DC | PRN
  Administered 2013-02-10: 2 mg via INTRAVENOUS

## 2013-02-10 MED ORDER — PROPOFOL 10 MG/ML IV BOLUS
INTRAVENOUS | Status: DC | PRN
  Administered 2013-02-10: 200 mg via INTRAVENOUS

## 2013-02-10 MED ORDER — SENNOSIDES-DOCUSATE SODIUM 8.6-50 MG PO TABS: 1.0000 | ORAL_TABLET | Freq: Two times a day (BID) | ORAL | Status: DC

## 2013-02-10 MED ORDER — STERILE WATER FOR IRRIGATION IR SOLN
Status: DC | PRN
  Administered 2013-02-10: 3000 mL

## 2013-02-10 MED ORDER — LIDOCAINE HCL (CARDIAC) 20 MG/ML IV SOLN
INTRAVENOUS | Status: DC | PRN
  Administered 2013-02-10: 100 mg via INTRAVENOUS

## 2013-02-10 MED ORDER — INSULIN ASPART 100 UNIT/ML ~~LOC~~ SOLN
0.0000 [IU] | Freq: Three times a day (TID) | SUBCUTANEOUS | Status: DC
  Administered 2013-02-10: 11 [IU] via SUBCUTANEOUS
  Filled 2013-02-10: qty 0.15

## 2013-02-10 MED ORDER — CIPROFLOXACIN IN D5W 400 MG/200ML IV SOLN
400.0000 mg | Freq: Two times a day (BID) | INTRAVENOUS | Status: DC
  Administered 2013-02-10: 400 mg via INTRAVENOUS
  Filled 2013-02-10: qty 200

## 2013-02-10 MED ORDER — KETOROLAC TROMETHAMINE 30 MG/ML IJ SOLN
INTRAMUSCULAR | Status: DC | PRN
  Administered 2013-02-10: 30 mg via INTRAVENOUS

## 2013-02-10 MED ORDER — LACTATED RINGERS IV SOLN
INTRAVENOUS | Status: DC
  Filled 2013-02-10: qty 1000

## 2013-02-10 MED ORDER — ONDANSETRON HCL 4 MG/2ML IJ SOLN
INTRAMUSCULAR | Status: DC | PRN
  Administered 2013-02-10: 4 mg via INTRAVENOUS

## 2013-02-10 MED ORDER — LACTATED RINGERS IV SOLN
INTRAVENOUS | Status: DC
  Administered 2013-02-10: 14:00:00 via INTRAVENOUS
  Filled 2013-02-10: qty 1000

## 2013-02-10 MED ORDER — FENTANYL CITRATE 0.05 MG/ML IJ SOLN
25.0000 ug | INTRAMUSCULAR | Status: DC | PRN
  Filled 2013-02-10: qty 1

## 2013-02-10 MED ORDER — TRAMADOL HCL 50 MG PO TABS: 50.0000 mg | ORAL_TABLET | Freq: Four times a day (QID) | ORAL | Status: DC | PRN

## 2013-02-10 MED ORDER — IOHEXOL 300 MG/ML  SOLN
INTRAMUSCULAR | Status: DC | PRN
  Administered 2013-02-10: 15 mL via INTRAVENOUS

## 2013-02-10 MED ORDER — FENTANYL CITRATE 0.05 MG/ML IJ SOLN
INTRAMUSCULAR | Status: DC | PRN
  Administered 2013-02-10: 100 ug via INTRAVENOUS

## 2013-02-10 MED ORDER — GENTAMICIN IN SALINE 1.6-0.9 MG/ML-% IV SOLN
80.0000 mg | INTRAVENOUS | Status: DC
  Filled 2013-02-10: qty 50

## 2013-02-10 SURGICAL SUPPLY — 21 items
BAG URO CATCHER STRL LF (DRAPE) ×2 IMPLANT
BASKET LASER NITINOL 1.9FR (BASKET) IMPLANT
BASKET ZERO TIP NITINOL 2.4FR (BASKET) IMPLANT
BSKT STON RTRVL 120 1.9FR (BASKET)
BSKT STON RTRVL ZERO TP 2.4FR (BASKET)
CANISTER SUCT LVC 12 LTR MEDI- (MISCELLANEOUS) ×1 IMPLANT
CATH INTERMIT  6FR 70CM (CATHETERS) ×1 IMPLANT
CLOTH BEACON ORANGE TIMEOUT ST (SAFETY) ×2 IMPLANT
DRAPE CAMERA CLOSED 9X96 (DRAPES) ×2 IMPLANT
GLOVE BIO SURGEON STRL SZ7.5 (GLOVE) ×2 IMPLANT
GLOVE BIOGEL PI IND STRL 7.5 (GLOVE) IMPLANT
GLOVE BIOGEL PI INDICATOR 7.5 (GLOVE) ×2
GOWN PREVENTION PLUS XLARGE (GOWN DISPOSABLE) ×1 IMPLANT
GOWN STRL NON-REIN LRG LVL3 (GOWN DISPOSABLE) ×4 IMPLANT
GUIDEWIRE ANG ZIPWIRE 038X150 (WIRE) ×2 IMPLANT
GUIDEWIRE STR DUAL SENSOR (WIRE) ×2 IMPLANT
IV NS IRRIG 3000ML ARTHROMATIC (IV SOLUTION) ×1 IMPLANT
PACK CYSTOSCOPY (CUSTOM PROCEDURE TRAY) ×2 IMPLANT
SYRINGE 10CC LL (SYRINGE) ×2 IMPLANT
TUBE FEEDING 8FR 16IN STR KANG (MISCELLANEOUS) ×2 IMPLANT
WATER STERILE IRR 3000ML UROMA (IV SOLUTION) ×1 IMPLANT

## 2013-02-10 NOTE — Anesthesia Postprocedure Evaluation (Signed)
  Anesthesia Post-op Note  Patient: Luis Joseph.  Procedure(s) Performed: Procedure(s) (LRB): CYSTOSCOPY/RETROGRADE/URETEROSCOPY Right stent removal  Procedure: Cysto, Right Retrograde, Right Stent Pull, Right Diagnostic Ureteroscopy (Right)  Patient Location: PACU  Anesthesia Type: General  Level of Consciousness: awake and alert   Airway and Oxygen Therapy: Patient Spontanous Breathing  Post-op Pain: mild  Post-op Assessment: Post-op Vital signs reviewed, Patient's Cardiovascular Status Stable, Respiratory Function Stable, Patent Airway and No signs of Nausea or vomiting  Last Vitals:  Filed Vitals:   02/10/13 1612  BP: 149/99  Pulse: 66  Temp: 36.1 C  Resp: 16    Post-op Vital Signs: stable   Complications: No apparent anesthesia complications

## 2013-02-10 NOTE — Brief Op Note (Signed)
02/10/2013  2:24 PM  PATIENT:  Luis Joseph.  57 y.o. male  PRE-OPERATIVE DIAGNOSIS:  RETRACTED RIGHT URETERAL STENT, RIGHT URETEROPELVIC JUNCTION OBSTRUCTION  POST-OPERATIVE DIAGNOSIS:   RIGHT URETEROPELVIC JUNCTION OBSTRUCTION  PROCEDURE:  Procedure(s): CYSTOSCOPY/RETROGRADE/URETEROSCOPY Right stent removal  Procedure: Cysto, Right Retrograde, Right Stent Pull, Right Diagnostic Ureteroscopy (Right)  SURGEON:  Surgeon(s) and Role:    * Alexis Frock, MD - Primary  PHYSICIAN ASSISTANT:   ASSISTANTS: none   ANESTHESIA:   general  EBL:  Total I/O In: 200 [I.V.:200] Out: -   BLOOD ADMINISTERED:none  DRAINS: none   LOCAL MEDICATIONS USED:  NONE  SPECIMEN:  No Specimen  DISPOSITION OF SPECIMEN:  N/A  COUNTS:  YES  TOURNIQUET:  * No tourniquets in log *  DICTATION: .Other Dictation: Dictation Number [id number]  PLAN OF CARE: Discharge to home after PACU  PATIENT DISPOSITION:  PACU - hemodynamically stable.   Delay start of Pharmacological VTE agent (>24hrs) due to surgical blood loss or risk of bleeding: not applicable

## 2013-02-10 NOTE — Anesthesia Procedure Notes (Signed)
Procedure Name: LMA Insertion Date/Time: 02/10/2013 2:01 PM Performed by: Bethena Roys T Pre-anesthesia Checklist: Patient identified, Emergency Drugs available, Suction available and Patient being monitored Patient Re-evaluated:Patient Re-evaluated prior to inductionOxygen Delivery Method: Circle System Utilized Preoxygenation: Pre-oxygenation with 100% oxygen Intubation Type: IV induction Ventilation: Mask ventilation without difficulty LMA: LMA inserted LMA Size: 5.0 Number of attempts: 1 Airway Equipment and Method: bite block Placement Confirmation: positive ETCO2 Dental Injury: Teeth and Oropharynx as per pre-operative assessment

## 2013-02-10 NOTE — H&P (Signed)
Luis Dohn. is an 57 y.o. male.     Chief Complaint: PRe-OP Rt Diagnostic Ureteroscopy / Stent Pull  HPI:    1 - Rt Ureteropelvic Junction Obstruction, Severe Rt Hydronephrosis - s/p open rt pyloplasty by Hessie Diener in late 1970s. Noted to have significant rt hydroneprhosis 2014 on w/u of flank pain with  rt renal cortical thinning.Rt JJ stent placed 11/2012 to maximize renal funciton. Renogram 12/2012 with stent in place shows 52% rt / 40% left differential wtih T1/2 Rt 69min (confirms capacious collecting system, but not true obstruction wtih stent in place).  2 - Nephrolithiasis - Pt with Large volume rt lower pole calcifications that are mostly parenchymal by CT in addition to scattered puncate stones. No prior colic. He has Gout.  3 - Chronic Renal Insuficiency - Basleine Cr 1.5-2 range since 2012. Poorly controlled diabetic.  PMH sig for obesity (interested in bariatric surgery), DM2, HTN, Gout (Allopurinol).  Today Luis Joseph is seen operative diagnostic right ureterosocpy and stent pull. He did not tollerate office cystoscopy attempt last week.   No interval fevers.    Past Medical History  Diagnosis Date  . Hyperlipidemia 05/21/1988  . Hypertension 05/21/1978  . Gout 05/21/1985  . GERD (gastroesophageal reflux disease) 05/21/1978  . Migraines     Better after retirement  . Diabetes mellitus, type 2   . History of kidney stones   . Hyperparathyroidism, secondary renal   . Anemia in chronic kidney disease   . Allergic rhinitis   . Hydronephrosis, right   . Renal cyst, left   . CKD (chronic kidney disease), stage III     NEPHROLOGIST-  DR Lavonia Dana  . Hematuria     Past Surgical History  Procedure Laterality Date  . Partial nephrectomy  12/19/1977    RIGHT PYELOPLASTY AND removal benign cyst and stones  . Cystoscopy w/ ureteral stent placement Right 11/25/2012    Procedure: CYSTOSCOPY WITH RETROGRADE PYELOGRAM/URETERAL STENT PLACEMENT;  Surgeon: Luis Ben, MD;   Location: [place];  Service: Urology;  Laterality: Right;    Family History  Problem Relation Age of Onset  . Heart disease Mother   . Diabetes Mother   . Hypertension Mother   . Cancer Father     Lung  . Diabetes Sister   . Heart disease Maternal Grandfather   . Diabetes Maternal Grandfather   . Stroke Neg Hx   . Prostate cancer Neg Hx   . Colon cancer Neg Hx    Social History:  reports that he quit smoking about 35 years ago. He has never used smokeless tobacco. He reports that he does not drink alcohol or use illicit drugs.  Allergies:  Allergies  Allergen Reactions  . Codeine Sulfate Hives and Swelling  . Sildenafil Citrate     flushing    No prescriptions prior to admission    No results found for this or any previous visit (from the past 48 hour(s)). No results found.  Review of Systems  Constitutional: Negative.  Negative for fever and chills.  HENT: Negative.   Eyes: Negative.   Respiratory: Negative.   Cardiovascular: Negative.   Gastrointestinal: Negative.   Genitourinary: Negative.   Musculoskeletal: Negative.   Skin: Negative.   Neurological: Negative.   Endo/Heme/Allergies: Negative.   Psychiatric/Behavioral: Negative.     Height 5\' 11"  (1.803 m), weight 124.286 kg (274 lb). Physical Exam  Constitutional: He is oriented to person, place, and time. He appears well-developed and well-nourished.  HENT:  Head: Normocephalic and atraumatic.  Eyes: EOM are normal. Pupils are equal, round, and reactive to light.  Neck: Normal range of motion.  Cardiovascular: Normal rate.   Respiratory: Effort normal.  GI: Soft. Bowel sounds are normal.  Genitourinary: Penis normal.  Musculoskeletal: Normal range of motion.  Neurological: He is alert and oriented to person, place, and time.  Skin: Skin is warm and dry.  Psychiatric: He has a normal mood and affect. His behavior is normal. Judgment and thought content normal.      Assessment/Plan  1 - Rt Ureteropelvic Junction Obstruction, Severe Rt Hydronephrosis - renogram favors that most of this likely chronic changes as function excellent. Would therefore recomend trial of stent-free and repeat renogram in 49mos to verify approx stability, then if favorable, yearly surveillance.   As he did not tollerate attempt office removal, we will perform in OR along with rt diagnostic ureteroscopy to doubly verify no obvious obstruction.  We rescussed diagnostic ureteroscopy with stent removal in detail.  We rediscussed risks including bleeding, infection, damage to kidney / ureter  bladder, rarely loss of kidney. We rediscussed anesthetic risks and rare but serious surgical complications including DVT, PE, MI, and mortality.  The patient voiced understanding and wises to proceed.   2 - Nephrolithiasis - Most rt stone appears parenchymal and unlikely removable, but fortunatly unlikely to cause problems, observe.  This will be verified directly ad ureteroscopy as per above.  3 - Chronic Renal Insuficiency - I mentieond to pt that his chronic rt kidney dysfunction in additon to his medical renal disease (diabetes) are likely contributory and encouraged excellent glycemic conrol.    Bing Duffey 02/10/2013, 8:24 AM

## 2013-02-10 NOTE — Anesthesia Preprocedure Evaluation (Addendum)
Anesthesia Evaluation  Patient identified by MRN, date of birth, ID band Patient awake    Reviewed: Allergy & Precautions, H&P , NPO status , Patient's Chart, lab work & pertinent test results, reviewed documented beta blocker date and time   Airway Mallampati: III TM Distance: >3 FB Neck ROM: full    Dental no notable dental hx.    Pulmonary neg pulmonary ROS,  breath sounds clear to auscultation  Pulmonary exam normal       Cardiovascular Exercise Tolerance: Good hypertension, Pt. on medications and Pt. on home beta blockers Rhythm:regular Rate:Normal     Neuro/Psych vertigo negative neurological ROS  negative psych ROS   GI/Hepatic Neg liver ROS, GERD-  Medicated and Controlled,  Endo/Other  diabetes, Well Controlled, Type 2, Oral Hypoglycemic AgentsSecondary hyperparathyroidism  Renal/GU Renal diseaseStage 3 kidney disease  negative genitourinary   Musculoskeletal   Abdominal   Peds  Hematology negative hematology ROS (+)   Anesthesia Other Findings   Reproductive/Obstetrics negative OB ROS                          Anesthesia Physical Anesthesia Plan  ASA: III  Anesthesia Plan: General   Post-op Pain Management:    Induction: Intravenous  Airway Management Planned: LMA  Additional Equipment:   Intra-op Plan:   Post-operative Plan:   Informed Consent: I have reviewed the patients History and Physical, chart, labs and discussed the procedure including the risks, benefits and alternatives for the proposed anesthesia with the patient or authorized representative who has indicated his/her understanding and acceptance.   Dental Advisory Given  Plan Discussed with: CRNA and Surgeon  Anesthesia Plan Comments: (Recheck BS in pacu. Insulin prn)       Anesthesia Quick Evaluation

## 2013-02-10 NOTE — Transfer of Care (Signed)
Immediate Anesthesia Transfer of Care Note  Patient: Luis Joseph.  Procedure(s) Performed: Procedure(s): CYSTOSCOPY/RETROGRADE/URETEROSCOPY Right stent removal  Procedure: Cysto, Right Retrograde, Right Stent Pull, Right Diagnostic Ureteroscopy (Right)  Patient Location: PACU  Anesthesia Type:General  Level of Consciousness: awake, alert  and oriented  Airway & Oxygen Therapy: Patient Spontanous Breathing and Patient connected to nasal cannula oxygen  Post-op Assessment: Report given to PACU RN and Post -op Vital signs reviewed and stable  Post vital signs: Reviewed and stable  Complications: No apparent anesthesia complications

## 2013-02-11 ENCOUNTER — Encounter (HOSPITAL_BASED_OUTPATIENT_CLINIC_OR_DEPARTMENT_OTHER): Payer: Self-pay | Admitting: Urology

## 2013-02-11 NOTE — Op Note (Signed)
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  PROCEDURE: 1. Cystoscopy with right retrograde pyelogram interpretation. 2. Removal of right ureteral stent. 3. Right diagnostic ureteroscopy.  ESTIMATED BLOOD LOSS:  Nil.  COMPLICATIONS:  None.  SPECIMENS:  None.  FINDINGS: 1. Widely patent ureter from the ureteral orifice to the kidney. 2. Severely hydronephrotic kidney without ureteral nephrosis. 3. Right lower pole parenchymal stone visible behind tissue.  INDICATION:  Luis Joseph is a pleasant 57 year old gentleman with remote history of right UPJ obstruction, who underwent open repair approximately 30 years ago.  He represented with questionable history of right colicky flank pain, was found to have severe right hydronephrosis and it was unclear how much of this was chronic changes versus any acute recurrent obstruction.  He underwent initial trial of right ureteral stenting, however, this did not change his symptoms.  He underwent nuclear medicine renogram which revealed preserved right renal function and very capacious right kidney as expected.  This finding was felt to be most consistent with likely chronic changes and CT scan revealed he did have some right lower pole stone, however, it appeared to be parenchymal, so removal may actually harm the kidney.  Options were discussed for further management and he was suggested to repeat trial of stent free.  He wished to proceed.  He underwent attempted office removal, however, he did not tolerate this.  Options were discussed further including removal  in the operating room with concomitant ureteroscopy to help verify and no evidence of right obstruction, and he wished to proceed.  Informed consent was obtained and placed in medical record.  DESCRIPTION OF PROCEDURE:  The patient being Luis Joseph was verified. Procedure being cystoscopy, right retrograde pyelogram, right diagnostic ureteroscopy and stone manipulation was confirmed.  Procedure was carried out.  Time-out was performed.  Intravenous antibiotics were administered.  General LMA anesthesia was introduced.  The patient placed into a low lithotomy position.  Sterile field was created by prepping and draping the patient's penis, perineum, and proximal thighs using iodine x3.  Next, cystourethroscopy was performed using a 22- French rigid cystoscope with 12-degree offset lens.  Inspection of the anterior-posterior urethra was unremarkable.  Inspection of the urinary bladder revealed no diverticula, calcifications, papillary lesions.  The distal end of right ureteral stent was seen in situ.  It was grasped brought out in its entirety.  Next, the right ureteral orifice was cannulated with a 6-French end-hole catheter and right retrograde pyelogram was obtained.  Right retrograde pyelogram demonstrated a single right ureter with single system, right kidney.  There was massive hydronephrosis without ureteral nephrosis.  There was no obvious narrowing at the area of the UPJ.  A 0.038 Glidewire was advanced slowly upper pole and set aside as a  safety wire.  A Sensor wire was advanced at level of the upper pole and set aside as a working wire.  Next, the 6-French flexible ureteroscope was advanced over the working wire to the level of the upper pole using fluoroscopic guidance, and endoscopic examination was performed of the entire right kidney.  This revealed massive hydronephrosis.  There was a area of stone in the lower pole that was behind a thin amount of tissue consistent  with known parenchymal stone. This confirmed suspicion on CT scan.  There was no free floating stone whatsoever within the right kidney or ureter.  The scope was carefully withdrawn and moved back and forth across the area of the UPJ, and it was found to be widely patent.  Photo documentation was performed of this.  The entire length of the right ureter was inspected and no evidence of narrowing was seen.  As such, the ureteroscope was removed. Bladder was emptied per cystoscope.  Procedure was then terminated.  The patient tolerated the procedure well.  There were no immediate periprocedural complications.  The patient was taken to [facility] in stable condition.          ______________________________ Alexis Frock, MD     TM/MEDQ  D:  02/10/2013  T:  02/11/2013  Job:  [id number]

## 2013-03-09 ENCOUNTER — Encounter: Payer: No Typology Code available for payment source | Attending: Surgery | Admitting: Dietician

## 2013-03-09 VITALS — Ht 71.0 in | Wt 270.9 lb

## 2013-03-09 DIAGNOSIS — E669 Obesity, unspecified: Secondary | ICD-10-CM | POA: Insufficient documentation

## 2013-03-09 DIAGNOSIS — Z713 Dietary counseling and surveillance: Secondary | ICD-10-CM | POA: Insufficient documentation

## 2013-03-09 NOTE — Patient Instructions (Signed)
Testing your blood sugar 1 x day at different times and bring log to next bring appointment. Bring snacks with protein to have at work.  Work on [employer].  Check out foods on [employer]

## 2013-03-09 NOTE — Progress Notes (Signed)
  6 Months Supervised Weight Loss Visit:   Pre-Operative Sleeve Gastrectomy or RYGB Surgery  Medical Nutrition Therapy:  Appt start time: [id number] end time:  1630.  Primary concerns today: Supervised Weight Loss Visit. States that he is not eating cereal anymore and will sometimes have oatmeal for dinner. States he is using smaller plates for dinner.   Weight: 270.9 lbs BMI: 37.8  Medications: See list  Recent physical activity:  Nothing besides working, walking and lifting all day  Supplements given out during visit include:  PB2: lot #: none, exp: 06/02/13  Progress Towards Goal(s):  In progress.  Handouts given during visit include:  15 g CHO    Nutritional Diagnosis:  [place]-3.3 Overweight/obesity related to past poor dietary habits and physical inactivity as evidenced by patient approval for bariatric surgery following dietary guidelines for continued weight loss.    Intervention:  Nutrition counseling provided. Plan: Testing your blood sugar 1 x day at different times and bring log to next bring appointment. Bring snacks with protein to have at work.  Work on [employer].  Check out foods on [employer]  Monitoring/Evaluation:  Dietary intake, exercise, and body weight. Follow up in 5months for 6 month supervised weight loss visit.

## 2013-03-26 ENCOUNTER — Other Ambulatory Visit: Payer: Self-pay

## 2013-04-07 ENCOUNTER — Encounter: Payer: No Typology Code available for payment source | Attending: Surgery | Admitting: Dietician

## 2013-04-07 VITALS — Ht 71.0 in | Wt 269.7 lb

## 2013-04-07 DIAGNOSIS — Z713 Dietary counseling and surveillance: Secondary | ICD-10-CM | POA: Insufficient documentation

## 2013-04-07 DIAGNOSIS — E669 Obesity, unspecified: Secondary | ICD-10-CM | POA: Insufficient documentation

## 2013-04-07 NOTE — Patient Instructions (Addendum)
Testing your blood sugar 1 x day at different times and bring log to next bring appointment. Bring snacks with protein to have at work.  Work on [employer].  Check out foods on [employer]  Aim to get more exercise in with a goal of 30 min 5 x week.

## 2013-04-07 NOTE — Progress Notes (Signed)
  6 Months Supervised Weight Loss Visit:   Pre-Operative Sleeve Gastrectomy or RYGB Surgery  Medical Nutrition Therapy:  Appt start time: [id number] end time:  1630.  Primary concerns today: Supervised Weight Loss Visit. Cut back on potatoes, bread, and hamburgers. Not eating as much fast food or restaurant meals. Adding peanut or almond butter and crackers. Testing blood sugar 2 x day, averaging 111-175 mg/dl.  Weight: 270.9 lbs 269.7 lbs  BMI: 37.8  Medications: See list  Recent physical activity:  Nothing besides working, walking and lifting all day   Progress Towards Goal(s):  In progress.  Handouts given during visit include:  15 g CHO    Nutritional Diagnosis:  [place]-3.3 Overweight/obesity related to past poor dietary habits and physical inactivity as evidenced by patient approval for bariatric surgery following dietary guidelines for continued weight loss.    Intervention:  Nutrition counseling provided. Plan: Testing your blood sugar 1 x day at different times and bring log to next bring appointment. Bring snacks with protein to have at work.  Work on [employer].  Check out foods on [employer]  Aim to get more exercise in with a goal of 30 min 5 x week.   Monitoring/Evaluation:  Dietary intake, exercise, and body weight. Follow up in 1 months for 6 month supervised weight loss visit.

## 2013-04-08 ENCOUNTER — Ambulatory Visit: Payer: No Typology Code available for payment source | Admitting: Dietician

## 2013-04-17 ENCOUNTER — Ambulatory Visit (INDEPENDENT_AMBULATORY_CARE_PROVIDER_SITE_OTHER): Payer: PRIVATE HEALTH INSURANCE | Admitting: Emergency Medicine

## 2013-04-17 VITALS — BP 188/126 | HR 68 | Temp 97.7°F | Resp 20 | Ht 70.5 in | Wt 265.6 lb

## 2013-04-17 DIAGNOSIS — G43109 Migraine with aura, not intractable, without status migrainosus: Secondary | ICD-10-CM

## 2013-04-17 MED ORDER — PROMETHAZINE HCL 25 MG/ML IJ SOLN: 50.0000 mg | Freq: Once | INTRAMUSCULAR | Status: DC

## 2013-04-17 MED ORDER — KETOROLAC TROMETHAMINE 60 MG/2ML IM SOLN
60.0000 mg | Freq: Once | INTRAMUSCULAR | Status: AC
  Administered 2013-04-17: 60 mg via INTRAMUSCULAR

## 2013-04-17 MED ORDER — PROMETHAZINE HCL 25 MG/ML IJ SOLN
50.0000 mg | Freq: Once | INTRAMUSCULAR | Status: AC
  Administered 2013-04-17: 50 mg via INTRAMUSCULAR

## 2013-04-17 NOTE — Progress Notes (Signed)
[facility] [address], [place] [address] [phone]  Date:  04/17/2013   Name:  Luis Joseph.   DOB:  November 26, 1955   MRN:  [id number]  PCP:  Elsie Stain, MD    Chief Complaint: Headache   History of Present Illness:  Luis Joseph. is a 57 y.o. very pleasant male patient who presents with the following:  Worked last night night shift and home sleeping and awoke at 1100 with severe right sided headache associated with photophobia and nausea.  Says this is typical presentation for his usual headache.  No antecedent illness, injury.  Last migraine requiring medical attention was in 1980.  No fever or chills.  Usually takes treximet but is out.  No neuro symptoms.  No difficulty with gait balance or coordination.  No improvement with over the counter medications or other home remedies. Denies other complaint or health concern today.   Patient Active Problem List   Diagnosis Date Noted  . Routine general medical examination at a health care facility 09/29/2012  . Elevated serum creatinine 09/29/2012  . Migraine, unspecified, without mention of intractable migraine without mention of status migrainosus 08/04/2012  . Erectile dysfunction 07/25/2011  . Postnasal drip 07/25/2011  . Muscle cramp, nocturnal 07/25/2011  . Vertigo 07/25/2011  . FATIGUE 06/06/2010  . Diabetes type 2, uncontrolled 03/17/2010  . SKIN LESION 03/17/2010  . DERMATITIS 10/20/2009  . ANXIETY 06/30/2008  . UNSPECIFIED SLEEP DISTURBANCE 06/10/2008  . OBESITY, UNSPECIFIED 04/21/2007  . COUGH 08/27/2006  . HYPERLIPIDEMIA 05/21/1988  . GOUT 05/21/1985  . HYPERTENSION 05/21/1978  . GERD 05/21/1978  . HEADACHE, CLUSTER 05/21/1976    Past Medical History  Diagnosis Date  . Hyperlipidemia 05/21/1988  . Hypertension 05/21/1978  . Gout 05/21/1985  . GERD (gastroesophageal reflux disease) 05/21/1978  . Migraines     Better after retirement  . Diabetes mellitus, type 2   .  History of kidney stones   . Hyperparathyroidism, secondary renal   . Anemia in chronic kidney disease   . Allergic rhinitis   . Hydronephrosis, right   . Renal cyst, left   . CKD (chronic kidney disease), stage III     NEPHROLOGIST-  DR Lavonia Dana  . Hematuria     Past Surgical History  Procedure Laterality Date  . Partial nephrectomy  12/19/1977    RIGHT PYELOPLASTY AND removal benign cyst and stones  . Cystoscopy w/ ureteral stent placement Right 11/25/2012    Procedure: CYSTOSCOPY WITH RETROGRADE PYELOGRAM/URETERAL STENT PLACEMENT;  Surgeon: Hanley Ben, MD;  Location: [facility];  Service: Urology;  Laterality: Right;  . Cystoscopy/retrograde/ureteroscopy Right 02/10/2013    Procedure: CYSTOSCOPY/RETROGRADE/URETEROSCOPY Right stent removal  Procedure: Cysto, Right Retrograde, Right Stent Pull, Right Diagnostic Ureteroscopy;  Surgeon: Alexis Frock, MD;  Location[facility];  Service: Urology;  Laterality: Right;    History  Substance Use Topics  . Smoking status: Former Smoker -- 6 years    Quit date: 11/17/1977  . Smokeless tobacco: Never Used  . Alcohol Use: No    Family History  Problem Relation Age of Onset  . Heart disease Mother   . Diabetes Mother   . Hypertension Mother   . Cancer Father     Lung  . Diabetes Sister   . Heart disease Maternal Grandfather   . Diabetes Maternal Grandfather   . Stroke Neg Hx   . Prostate cancer Neg Hx   . Colon cancer Neg  Hx     Allergies  Allergen Reactions  . Codeine Sulfate Hives and Swelling  . Sildenafil Citrate     flushing    Medication list has been reviewed and updated.  Current Outpatient Prescriptions on File Prior to Visit  Medication Sig Dispense Refill  . allopurinol (ZYLOPRIM) 100 MG tablet Take 100 mg by mouth.       Marland Kitchen allopurinol (ZYLOPRIM) 300 MG tablet Take 300 mg by mouth every evening.      Marland Kitchen aspirin 81 MG tablet Take 162 mg by mouth daily.      . Cinnamon  500 MG capsule Take 500 mg by mouth 2 (two) times daily.      . cloNIDine (CATAPRES) 0.1 MG tablet Take 0.3 mg by mouth 2 (two) times daily.      . Coenzyme Q10 (COQ-10 PO) Take 300 mg by mouth daily.      Marland Kitchen desonide (DESOWEN) 0.05 % lotion Apply topically 2 (two) times daily as needed. Do not use on the face.  59 mL  2  . fexofenadine (ALLEGRA) 180 MG tablet Take 180 mg by mouth every morning.       . magnesium gluconate (MAGONATE) 500 MG tablet Take 500 mg by mouth daily.      . metFORMIN (GLUCOPHAGE) 500 MG tablet Take 500 mg by mouth 2 (two) times daily with a meal.       . metoprolol succinate (TOPROL-XL) 100 MG 24 hr tablet Take 100 mg by mouth 2 (two) times daily. Take with or immediately following a meal.      . Multiple Vitamin (MULTIVITAMIN) tablet Take 1 tablet by mouth daily.        . niacin 500 MG tablet Take 500 mg by mouth 2 (two) times daily with a meal.        . zinc gluconate 50 MG tablet Take 50 mg by mouth daily.      . colchicine 0.6 MG tablet Take 0.6 mg by mouth as needed.       . senna-docusate (SENOKOT-S) 8.6-50 MG per tablet Take 1 tablet by mouth 2 (two) times daily. While taking pain meds to prevent constipation  30 tablet  1  . SUMAtriptan-naproxen (TREXIMET) 85-500 MG per tablet Take 1 tablet by mouth every 2 (two) hours as needed. TAKE ONE TABLET BY MOUTH AT ONSET OF HEADACHE, MAY REPEAT ONCE IN 2 HOURS IF NEEDED      . traMADol (ULTRAM) 50 MG tablet Take 1 tablet (50 mg total) by mouth every 6 (six) hours as needed for pain. Post-operatively  30 tablet  0   No current facility-administered medications on file prior to visit.    Review of Systems:  As per HPI, otherwise negative.    Physical Examination: Filed Vitals:   04/17/13 1553  BP: 188/126  Pulse: 68  Temp: 97.7 F (36.5 C)  Resp: 20   Filed Vitals:   04/17/13 1553  Height: 5' 10.5" (1.791 m)  Weight: 265 lb 9.6 oz (120.475 kg)   Body mass index is 37.56 kg/(m^2). Ideal Body Weight: Weight  in (lb) to have BMI = 25: 176.4  GEN: WDWN, NAD, Non-toxic, A & O x 3 HEENT: Atraumatic, Normocephalic. Neck supple. No masses, No LAD.  CN 2-12 intact PRRERLA EOMI fundi benign.   Ears and Nose: No external deformity. CV: RRR, No M/G/R. No JVD. No thrill. No extra heart sounds. PULM: CTA B, no wheezes, crackles, rhonchi. No retractions. No resp. distress. No  accessory muscle use. ABD: S, NT, ND, +BS. No rebound. No HSM. EXTR: No c/c/e NEURO Normal gait.  PSYCH: Normally interactive. Conversant. Not depressed or anxious appearing.  Calm demeanor.    Assessment and Plan: Migraine toradol Phenergan  Signed,  Ellison Carwin, MD

## 2013-04-23 ENCOUNTER — Encounter: Payer: Self-pay | Admitting: Family Medicine

## 2013-04-23 ENCOUNTER — Ambulatory Visit (INDEPENDENT_AMBULATORY_CARE_PROVIDER_SITE_OTHER): Payer: No Typology Code available for payment source | Admitting: Family Medicine

## 2013-04-23 VITALS — BP 146/96 | HR 77 | Temp 98.2°F | Wt 266.5 lb

## 2013-04-23 DIAGNOSIS — R799 Abnormal finding of blood chemistry, unspecified: Secondary | ICD-10-CM

## 2013-04-23 DIAGNOSIS — R7989 Other specified abnormal findings of blood chemistry: Secondary | ICD-10-CM

## 2013-04-23 DIAGNOSIS — G43909 Migraine, unspecified, not intractable, without status migrainosus: Secondary | ICD-10-CM

## 2013-04-23 DIAGNOSIS — N200 Calculus of kidney: Secondary | ICD-10-CM

## 2013-04-23 LAB — BASIC METABOLIC PANEL
BUN: 24 mg/dL — ABNORMAL HIGH (ref 6–23)
Chloride: 101 mEq/L (ref 96–112)
Creat: 1.46 mg/dL — ABNORMAL HIGH (ref 0.50–1.35)
Glucose, Bld: 208 mg/dL — ABNORMAL HIGH (ref 70–99)

## 2013-04-23 MED ORDER — SUMATRIPTAN SUCCINATE 100 MG PO TABS: 100.0000 mg | ORAL_TABLET | ORAL | Status: DC | PRN

## 2013-04-23 NOTE — Patient Instructions (Addendum)
Go to the lab on the way out.  We'll contact you with your lab report. Take imitrex for the migraines if they return. Max 2 doses in 24 hours.  Taper off dairy and caffeine.  Take unisom for sleep.  Take care.

## 2013-04-23 NOTE — Progress Notes (Signed)
Pre-visit discussion using our clinic review tool. No additional management support is needed unless otherwise documented below in the visit note.  Has seen surgery re: gastric bypass, possible. Has had f/u with nutrition in the meantime.   DM2.  Still on 2 metformin a day.  Hasn't checked sugar recently, encouraged to do so.  Weight down 10 lbs.   No more blood in urine.  He had uro f/u prev.  The pain he had prev is resolved.    Migraine.  Now working two jobs.  [employer] and [employer].  Surrounded by [employer].  Early to rise for work, 4AM. Working late at night, to 9PM.  Taking either 2 benadryl or 2 tylenol PM at night to sleep.  He thought the migraines (increased recently) were due to trouble sleeping.  Already on BB.  Treximet helped prev but he didn't have any to take.  Toradol helped some, still with slight HA in the meantime.  Phenergan given shot also, with some relief.  He had typical migraine sx.  He thinks dairy may be making this worse.  Some caffeine daily.  Discussed cutting out both.   Meds, vitals, and allergies reviewed.   ROS: See HPI.  Otherwise, noncontributory.  GEN: nad, alert and oriented HEENT: mucous membranes moist NECK: supple w/o LA CV: rrr.   PULM: ctab, no inc wob ABD: soft, +bs EXT: no edema SKIN: no acute rash CN 2-12 wnl B, S/S/DTR wnl x4

## 2013-04-24 ENCOUNTER — Other Ambulatory Visit: Payer: Self-pay | Admitting: Family Medicine

## 2013-04-24 DIAGNOSIS — E119 Type 2 diabetes mellitus without complications: Secondary | ICD-10-CM

## 2013-04-24 DIAGNOSIS — N200 Calculus of kidney: Secondary | ICD-10-CM | POA: Insufficient documentation

## 2013-04-24 NOTE — Assessment & Plan Note (Signed)
D/w pt.  Would use plain imitrex if another HA.  already on BB.  Will fill out FMLA in case he has more migraines or hematuria.  Discussed diet/caffeine.  Continue other meds in meantime.  He agrees. >25 min spent with face to face with patient, >50% counseling and/or coordinating care.

## 2013-04-24 NOTE — Assessment & Plan Note (Signed)
Right lower pole parenchymal stone  With stent removed.  No more blood in urine now.  Pain resolved.

## 2013-04-24 NOTE — Assessment & Plan Note (Signed)
See notes on labs. 

## 2013-05-04 ENCOUNTER — Telehealth: Payer: Self-pay | Admitting: *Deleted

## 2013-05-04 NOTE — Telephone Encounter (Signed)
Recieved prior auth request from pharmacy for [place].  I obtained authorization verbally from insurance, and I notified pharmacy of approval via fax. Waiting on approval notice from insurance company to arrive via fax.

## 2013-05-05 ENCOUNTER — Encounter: Payer: No Typology Code available for payment source | Attending: Surgery | Admitting: Dietician

## 2013-05-05 VITALS — Ht 71.0 in | Wt 261.5 lb

## 2013-05-05 DIAGNOSIS — E669 Obesity, unspecified: Secondary | ICD-10-CM | POA: Insufficient documentation

## 2013-05-05 DIAGNOSIS — Z713 Dietary counseling and surveillance: Secondary | ICD-10-CM | POA: Insufficient documentation

## 2013-05-05 NOTE — Progress Notes (Signed)
  6 Months Supervised Weight Loss Visit:   Pre-Operative Sleeve Gastrectomy or RYGB Surgery  Medical Nutrition Therapy:  Appt start time: 1000 end time:  [id number].  Primary concerns today: Supervised Weight Loss Visit. Lost about 8.2 lbs since last visit. Was sick a couple times since last visit. Eating sandwiches at lunchtime instead of hamburgers and fries. Having apples and protein bars for snacks at work. Working on reducing caffeine since doctor stated it contributed to his migraines.   Testing blood sugar 2 x day, averaging 111-175 mg/dl.  Weight: 261.5 lbs Weight lost: 8.2 lbs  BMI: 36.5  Medications: See list  Recent physical activity:  Nothing besides working, walking and lifting all day   Progress Towards Goal(s):  In progress.  Handouts given during visit include:  15 g CHO    Nutritional Diagnosis:  [place]-3.3 Overweight/obesity related to past poor dietary habits and physical inactivity as evidenced by patient approval for bariatric surgery following dietary guidelines for continued weight loss.    Intervention:  Nutrition counseling provided. Plan: Work on [employer]. Continue bringing lunch and snacks with protein to have at work.  Work on [employer].  Check out foods on [employer]  Aim to get more exercise in with a goal of 30 min 5 x week.   Monitoring/Evaluation:  Dietary intake, exercise, and body weight. Follow up in 1 months for 6 month supervised weight loss visit.

## 2013-05-05 NOTE — Patient Instructions (Addendum)
Work on [employer]. Continue bringing lunch and snacks with protein to have at work.  Work on [employer].  Check out foods on [employer]  Aim to get more exercise in with a goal of 30 min 5 x week.

## 2013-05-11 ENCOUNTER — Other Ambulatory Visit: Payer: Self-pay | Admitting: Family Medicine

## 2013-05-18 DIAGNOSIS — N401 Enlarged prostate with lower urinary tract symptoms: Secondary | ICD-10-CM | POA: Insufficient documentation

## 2013-05-18 DIAGNOSIS — Q6239 Other obstructive defects of renal pelvis and ureter: Secondary | ICD-10-CM | POA: Insufficient documentation

## 2013-05-18 DIAGNOSIS — N23 Unspecified renal colic: Secondary | ICD-10-CM | POA: Insufficient documentation

## 2013-05-18 NOTE — Telephone Encounter (Signed)
Approval form received by insurance company. Will send to provider for signature, then to be scanned into patients medical record.

## 2013-06-01 ENCOUNTER — Telehealth: Payer: Self-pay

## 2013-06-01 NOTE — Telephone Encounter (Signed)
Left detailed message on voicemail.  

## 2013-06-01 NOTE — Telephone Encounter (Signed)
Pt saw Dr Jacqlyn Larsen and Dr Jacqlyn Larsen wants pt to get colonoscopy; pt has never had colonoscopy before. Pt not having any problems But Dr Jacqlyn Larsen said at pts age needs colonoscopy. pts insurance has changed and pt is to get meds at [facility] now; advised pt to contact [facility] and they will have meds transferred from [facility]. Pt voiced understanding.

## 2013-06-01 NOTE — Telephone Encounter (Signed)
This may await PCP's return.  plz notify pt Dr. Keturah Barre is out of office and will get in touch with him at his return.

## 2013-06-02 ENCOUNTER — Ambulatory Visit: Payer: No Typology Code available for payment source | Admitting: Dietician

## 2013-06-02 ENCOUNTER — Encounter: Payer: No Typology Code available for payment source | Attending: Surgery | Admitting: Dietician

## 2013-06-02 VITALS — Ht 71.0 in | Wt 266.2 lb

## 2013-06-02 DIAGNOSIS — Z9884 Bariatric surgery status: Secondary | ICD-10-CM | POA: Insufficient documentation

## 2013-06-02 DIAGNOSIS — Z713 Dietary counseling and surveillance: Secondary | ICD-10-CM | POA: Insufficient documentation

## 2013-06-02 DIAGNOSIS — Z6837 Body mass index (BMI) 37.0-37.9, adult: Secondary | ICD-10-CM | POA: Insufficient documentation

## 2013-06-02 DIAGNOSIS — N200 Calculus of kidney: Secondary | ICD-10-CM | POA: Insufficient documentation

## 2013-06-02 DIAGNOSIS — E669 Obesity, unspecified: Secondary | ICD-10-CM | POA: Insufficient documentation

## 2013-06-02 DIAGNOSIS — R51 Headache: Secondary | ICD-10-CM | POA: Insufficient documentation

## 2013-06-02 NOTE — Patient Instructions (Signed)
Work on [employer]. - work on chewing slowly. Continue bringing lunch and snacks with protein to have at work.  Work on [employer].  Check out foods on [employer]  Aim to get more exercise in with a goal of 30 min 5 x week.

## 2013-06-02 NOTE — Progress Notes (Signed)
  6 Months Supervised Weight Loss Visit:   Pre-Operative Sleeve Gastrectomy or RYGB Surgery  Medical Nutrition Therapy:  Appt start time: 1000 end time:  [id number]. Luis Joseph returns with a 5 lbs weight gain. Has been having some headaches recently. Still working on cutting down on caffeine to help prevent headaches. Has two large kidney stones imbedded which they cannot remove but are checking regularly. Has some reduced kidney (GFR in mid 3s).   Eating more burgers and fries than he was before. His wife has returned to work so she isn't making him sandwiches anymore. Still bring apples and protein bars for work. Still working two jobs, but less busy than during the holidays.  Not doing any physical activity.   Testing blood sugar 2 x day, averaging 111-175 mg/dl.  Wt Readings from Last 3 Encounters:  06/02/13 266 lb 3.2 oz (120.748 kg)  05/05/13 261 lb 8 oz (118.616 kg)  04/23/13 266 lb 8 oz (120.884 kg)   Ht Readings from Last 3 Encounters:  06/02/13 5\' 11"  (1.803 m)  05/05/13 5\' 11"  (1.803 m)  04/17/13 5' 10.5" (1.791 m)   Body mass index is 37.14 kg/(m^2). @BMIFA @ Normalized weight-for-age data available only for age 29 to 23 years. Normalized stature-for-age data available only for age 29 to 43 years.   Medications: See list  Recent physical activity:  Nothing besides working, walking and lifting all day   Progress Towards Goal(s):  In progress.  Handouts given during visit include:  15 g CHO    Nutritional Diagnosis:  [place]-3.3 Overweight/obesity related to past poor dietary habits and physical inactivity as evidenced by patient approval for bariatric surgery following dietary guidelines for continued weight loss.    Intervention:  Nutrition counseling provided.  Plan: Work on [employer]. - work on chewing slowly. Continue bringing lunch and snacks with protein to have at work.  Work on [employer].  Check out foods on [employer]  Aim to get more exercise in  with a goal of 30 min 5 x week.   Monitoring/Evaluation:  Dietary intake, exercise, and body weight. Follow up in 1 months for 6 month supervised weight loss visit.

## 2013-06-04 NOTE — Telephone Encounter (Signed)
Typically, one can get either an IFOB or a colonoscopy (assuming no FH of colon cancer).  We talked about this at the last CPE in 09/2012.  It appears he was given the IFOB, but I don't see that it was completed.  I would do at least the IFOB, but I'm okay with the colonoscopy.  If an outside MD suggests the colonoscopy, I wouldn't disagree.  I can refer if needed.

## 2013-06-05 NOTE — Telephone Encounter (Signed)
Patient advised.   Patient has CPE appt in March, will discuss with Dr. Damita Dunnings at that time.

## 2013-06-12 ENCOUNTER — Other Ambulatory Visit: Payer: Self-pay | Admitting: Family Medicine

## 2013-06-23 ENCOUNTER — Other Ambulatory Visit: Payer: Self-pay | Admitting: *Deleted

## 2013-06-23 MED ORDER — SUMATRIPTAN SUCCINATE 100 MG PO TABS: 100.0000 mg | ORAL_TABLET | ORAL | Status: DC | PRN

## 2013-06-23 NOTE — Telephone Encounter (Signed)
Jennifer from [facility] called stating that patient had transferred his Imitrex out and now wants to start getting this from them again. Anderson Malta request a new script because he does not have any refills left.

## 2013-06-23 NOTE — Telephone Encounter (Signed)
Sent!

## 2013-06-26 ENCOUNTER — Other Ambulatory Visit: Payer: Self-pay | Admitting: Family Medicine

## 2013-06-30 ENCOUNTER — Encounter: Payer: No Typology Code available for payment source | Attending: Surgery | Admitting: Dietician

## 2013-06-30 VITALS — Ht 71.0 in | Wt 266.8 lb

## 2013-06-30 DIAGNOSIS — E669 Obesity, unspecified: Secondary | ICD-10-CM | POA: Insufficient documentation

## 2013-06-30 DIAGNOSIS — Z713 Dietary counseling and surveillance: Secondary | ICD-10-CM | POA: Insufficient documentation

## 2013-06-30 NOTE — Progress Notes (Signed)
  6 Months Supervised Weight Loss Visit:   Pre-Operative Sleeve Gastrectomy or RYGB Surgery  Medical Nutrition Therapy:  Appt start time: 1230 end time:  [id number].   Luis Joseph returns with a stable weight. Thought that his weight would be up since last time. Still having frequent headaches. Only having caffeine when has diet soda at restaurants. Has been eating out more this month has been in previous, though getting some salads when going out.   Still not doing any physical activity.    Wt Readings from Last 3 Encounters:  06/30/13 266 lb 12.8 oz (121.02 kg)  06/02/13 266 lb 3.2 oz (120.748 kg)  05/05/13 261 lb 8 oz (118.616 kg)   Ht Readings from Last 3 Encounters:  06/30/13 5\' 11"  (1.803 m)  06/02/13 5\' 11"  (1.803 m)  05/05/13 5\' 11"  (1.803 m)   Body mass index is 37.23 kg/(m^2). @BMIFA @ Normalized weight-for-age data available only for age 44 to 10 years. Normalized stature-for-age data available only for age 44 to 21 years.   Medications: See list  Recent physical activity:  Nothing besides working, walking and lifting all day   Progress Towards Goal(s):  In progress.  Handouts given during visit include:  15 g CHO    Nutritional Diagnosis:  [place]-3.3 Overweight/obesity related to past poor dietary habits and physical inactivity as evidenced by patient approval for bariatric surgery following dietary guidelines for continued weight loss.    Intervention:  Nutrition counseling provided.  Plan: Work on [employer]. - work on chewing slowly, not drinking 15 minutes before up through 30 minutes after meals. Goal - take 20 minutes to eat meals.  Continue bringing lunch and snacks with protein to have at work.  Work on [employer].  Check out foods on [employer]  Aim to get more exercise in with a goal of 30 min 5 x week.   Monitoring/Evaluation:  Dietary intake, exercise, and body weight. Follow up in 1 months for 6 month supervised weight loss visit.

## 2013-06-30 NOTE — Patient Instructions (Addendum)
Work on [employer]. - work on chewing slowly, not drinking 15 minutes before up through 30 minutes after meals. Goal - take 20 minutes to eat meals.  Continue bringing lunch and snacks with protein to have at work.  Work on [employer].  Check out foods on [employer]  Aim to get more exercise in with a goal of 30 min 5 x week.

## 2013-07-28 ENCOUNTER — Encounter: Payer: No Typology Code available for payment source | Attending: Surgery | Admitting: Dietician

## 2013-07-28 VITALS — Ht 71.0 in | Wt 269.3 lb

## 2013-07-28 DIAGNOSIS — Z713 Dietary counseling and surveillance: Secondary | ICD-10-CM | POA: Insufficient documentation

## 2013-07-28 DIAGNOSIS — E669 Obesity, unspecified: Secondary | ICD-10-CM | POA: Insufficient documentation

## 2013-07-28 NOTE — Progress Notes (Signed)
  6 Months Supervised Weight Loss Visit:   Pre-Operative Sleeve Gastrectomy or RYGB Surgery  Medical Nutrition Therapy:  Appt start time: 1000 end time:  [id number].   Hakeem returns with a stable weight. Thought that his weight would be up since last time. Still having frequent headaches. Only having caffeine when has diet soda at restaurants. Has been eating out more this month has been in previous, though getting some salads when going out.   Still not doing any physical activity.   Wt Readings from Last 3 Encounters:  07/28/13 269 lb 4.8 oz (122.154 kg)  06/30/13 266 lb 12.8 oz (121.02 kg)  06/02/13 266 lb 3.2 oz (120.748 kg)   Ht Readings from Last 3 Encounters:  07/28/13 5\' 11"  (1.803 m)  06/30/13 5\' 11"  (1.803 m)  06/02/13 5\' 11"  (1.803 m)   Body mass index is 37.58 kg/(m^2). @BMIFA @ Normalized weight-for-age data available only for age 35 to 59 years. Normalized stature-for-age data available only for age 35 to 42 years.   Medications: See list  Recent physical activity:  Nothing besides working, walking and lifting all day   Progress Towards Goal(s):  In progress.    Nutritional Diagnosis:  [place]-3.3 Overweight/obesity related to past poor dietary habits and physical inactivity as evidenced by patient approval for bariatric surgery following dietary guidelines for continued weight loss.    Intervention:  Nutrition counseling provided.  Plan: Work on [employer]. - work on chewing slowly, not drinking 15 minutes before up through 30 minutes after meals. Goal - take 20 minutes to eat meals.  Continue bringing snacks with protein to have at work.  Work on [employer].  Check out foods on [employer]  Aim to get more exercise in with a goal of 30 min 5 x week.  Call [facility] when surgery is scheduled to enroll in Pre-Op Class.    Monitoring/Evaluation:  Dietary intake, exercise, and body weight. Follow up in 1 months for 6 month supervised weight loss visit.

## 2013-07-28 NOTE — Patient Instructions (Addendum)
Work on [employer]. - work on chewing slowly, not drinking 15 minutes before up through 30 minutes after meals. Goal - take 20 minutes to eat meals.  Continue bringing snacks with protein to have at work.  Work on [employer].  Check out foods on [employer]  Aim to get more exercise in with a goal of 30 min 5 x week.  Call [facility] when surgery is scheduled to enroll in Pre-Op Class.

## 2013-07-30 ENCOUNTER — Other Ambulatory Visit: Payer: Self-pay | Admitting: Family Medicine

## 2013-07-30 DIAGNOSIS — M109 Gout, unspecified: Secondary | ICD-10-CM

## 2013-07-30 DIAGNOSIS — E1165 Type 2 diabetes mellitus with hyperglycemia: Secondary | ICD-10-CM

## 2013-07-30 DIAGNOSIS — IMO0002 Reserved for concepts with insufficient information to code with codable children: Secondary | ICD-10-CM

## 2013-08-04 ENCOUNTER — Other Ambulatory Visit (INDEPENDENT_AMBULATORY_CARE_PROVIDER_SITE_OTHER): Payer: No Typology Code available for payment source

## 2013-08-04 DIAGNOSIS — IMO0001 Reserved for inherently not codable concepts without codable children: Secondary | ICD-10-CM

## 2013-08-04 DIAGNOSIS — IMO0002 Reserved for concepts with insufficient information to code with codable children: Secondary | ICD-10-CM

## 2013-08-04 DIAGNOSIS — E1165 Type 2 diabetes mellitus with hyperglycemia: Secondary | ICD-10-CM

## 2013-08-04 DIAGNOSIS — M109 Gout, unspecified: Secondary | ICD-10-CM

## 2013-08-04 LAB — LIPID PANEL
CHOLESTEROL: 239 mg/dL — AB (ref 0–200)
HDL: 38.3 mg/dL — ABNORMAL LOW (ref >39.00)
LDL Cholesterol: 129 mg/dL — ABNORMAL HIGH (ref 0–99)
Total CHOL/HDL Ratio: 6
Triglycerides: 360 mg/dL — ABNORMAL HIGH (ref 0.0–149.0)
VLDL: 72 mg/dL — AB (ref 0.0–40.0)

## 2013-08-04 LAB — COMPREHENSIVE METABOLIC PANEL
ALT: 20 U/L (ref 0–53)
AST: 18 U/L (ref 0–37)
Albumin: 3.7 g/dL (ref 3.5–5.2)
Alkaline Phosphatase: 59 U/L (ref 39–117)
BILIRUBIN TOTAL: 0.4 mg/dL (ref 0.3–1.2)
BUN: 29 mg/dL — ABNORMAL HIGH (ref 6–23)
CHLORIDE: 102 meq/L (ref 96–112)
CO2: 25 mEq/L (ref 19–32)
Calcium: 9.2 mg/dL (ref 8.4–10.5)
Creatinine, Ser: 1.8 mg/dL — ABNORMAL HIGH (ref 0.4–1.5)
GFR: 40.91 mL/min — AB (ref >60.00)
GLUCOSE: 180 mg/dL — AB (ref 70–99)
Potassium: 4.4 mEq/L (ref 3.5–5.1)
SODIUM: 137 meq/L (ref 135–145)
Total Protein: 7.4 g/dL (ref 6.0–8.3)

## 2013-08-04 LAB — URIC ACID: Uric Acid, Serum: 6.2 mg/dL (ref 4.0–7.8)

## 2013-08-04 LAB — HEMOGLOBIN A1C: HEMOGLOBIN A1C: 8.7 % — AB (ref 4.6–6.5)

## 2013-08-10 ENCOUNTER — Other Ambulatory Visit: Payer: Self-pay | Admitting: Family Medicine

## 2013-08-11 ENCOUNTER — Encounter: Payer: No Typology Code available for payment source | Admitting: Family Medicine

## 2013-10-02 ENCOUNTER — Other Ambulatory Visit: Payer: Self-pay | Admitting: Family Medicine

## 2013-10-06 ENCOUNTER — Encounter: Payer: Self-pay | Admitting: Family Medicine

## 2013-10-06 ENCOUNTER — Ambulatory Visit (INDEPENDENT_AMBULATORY_CARE_PROVIDER_SITE_OTHER): Payer: No Typology Code available for payment source | Admitting: Family Medicine

## 2013-10-06 VITALS — BP 142/96 | HR 75 | Temp 98.1°F | Wt 272.8 lb

## 2013-10-06 DIAGNOSIS — R799 Abnormal finding of blood chemistry, unspecified: Secondary | ICD-10-CM

## 2013-10-06 DIAGNOSIS — Z0000 Encounter for general adult medical examination without abnormal findings: Secondary | ICD-10-CM

## 2013-10-06 DIAGNOSIS — I10 Essential (primary) hypertension: Secondary | ICD-10-CM

## 2013-10-06 DIAGNOSIS — E119 Type 2 diabetes mellitus without complications: Secondary | ICD-10-CM

## 2013-10-06 DIAGNOSIS — IMO0002 Reserved for concepts with insufficient information to code with codable children: Secondary | ICD-10-CM

## 2013-10-06 DIAGNOSIS — R7989 Other specified abnormal findings of blood chemistry: Secondary | ICD-10-CM

## 2013-10-06 DIAGNOSIS — E1165 Type 2 diabetes mellitus with hyperglycemia: Secondary | ICD-10-CM

## 2013-10-06 DIAGNOSIS — Z1211 Encounter for screening for malignant neoplasm of colon: Secondary | ICD-10-CM

## 2013-10-06 DIAGNOSIS — IMO0001 Reserved for inherently not codable concepts without codable children: Secondary | ICD-10-CM

## 2013-10-06 LAB — BASIC METABOLIC PANEL
BUN: 27 mg/dL — ABNORMAL HIGH (ref 6–23)
CALCIUM: 9.1 mg/dL (ref 8.4–10.5)
CO2: 28 mEq/L (ref 19–32)
CREATININE: 2.07 mg/dL — AB (ref 0.50–1.35)
Chloride: 98 mEq/L (ref 96–112)
Glucose, Bld: 273 mg/dL — ABNORMAL HIGH (ref 70–99)
Potassium: 4.6 mEq/L (ref 3.5–5.3)
SODIUM: 135 meq/L (ref 135–145)

## 2013-10-06 NOTE — Progress Notes (Signed)
Pre visit review using our clinic review tool, if applicable. No additional management support is needed unless otherwise documented below in the visit note.  CPE- See plan.  Routine anticipatory guidance given to patient.  See health maintenance. Tetanus 2013 Flu prev done PNA 2014 Living will d/w pt.  Encouraged.  Wife designated if patient were incapacitated, then his daughter [name] if his wife were unavailable.   Diet and exercise d/w pt.  D/w patient [id number] for colon cancer screening, including IFOB vs. colonoscopy.  Risks and benefits of both were discussed and patient voiced understanding.  Pt elects for: IFOB.   Prostate cancer screening and PSA options (with potential risks and benefits of testing vs not testing) were discussed along with recent recs/guidelines.  He declined testing PSA at this point.   Still working retail.  Hypertension:    Using medication without problems or lightheadedness: yes Chest pain with exertion:no Edema:no Short of breath:no  Diabetes:  Using medications without difficulties:dry mouth with clonidine, see notes below on labs.  Hypoglycemic episodes:no Hyperglycemic episodes:yes Feet problems: occ numbness on L 1st toe Blood Sugars averaging: 160-240 eye exam within last year: yes  Elevated Cr.  D/w pt.  See note on f/u labs.  Has seen renal prev, not recently.    PMH and SH reviewed  Meds, vitals, and allergies reviewed.   ROS: See HPI.  Otherwise negative.    GEN: nad, alert and oriented HEENT: mucous membranes moist NECK: supple w/o LA CV: rrr. PULM: ctab, no inc wob ABD: soft, +bs EXT: no edema SKIN: no acute rash  Diabetic foot exam: Normal inspection No skin breakdown No calluses  Normal DP pulses Normal sensation to light touch and monofilament Nails normal

## 2013-10-06 NOTE — Patient Instructions (Addendum)
Go to the lab on the way out.  We'll contact you with your lab report. Don't change your meds for now.  Take care.  We need to recheck your A1c later in the summer before a visit.

## 2013-10-07 LAB — MICROALBUMIN / CREATININE URINE RATIO
Creatinine, Urine: 154.8 mg/dL
MICROALB UR: 396.9 mg/dL — AB (ref 0.00–1.89)
Microalb Creat Ratio: 2564 mg/g — ABNORMAL HIGH (ref 0.0–30.0)

## 2013-10-07 MED ORDER — GLIPIZIDE 5 MG PO TABS: 5.0000 mg | ORAL_TABLET | Freq: Two times a day (BID) | ORAL | Status: DC

## 2013-10-07 NOTE — Assessment & Plan Note (Signed)
No change in meds yet, see notes on BMET.  Cr is the main issue here.  Recheck BMET later in the week, will have patient see renal again soon.

## 2013-10-07 NOTE — Assessment & Plan Note (Signed)
Routine anticipatory guidance given to patient.  See health maintenance. Tetanus 2013 Flu prev done PNA 2014 Living will d/w pt.  Encouraged.  Wife designated if patient were incapacitated, then his daughter [name] if his wife were unavailable.   Diet and exercise d/w pt.  D/w patient [id number] for colon cancer screening, including IFOB vs. colonoscopy.  Risks and benefits of both were discussed and patient voiced understanding.  Pt elects for: IFOB.   Prostate cancer screening and PSA options (with potential risks and benefits of testing vs not testing) were discussed along with recent recs/guidelines.  He declined testing PSA at this point.

## 2013-10-07 NOTE — Assessment & Plan Note (Signed)
Recheck BMET later in the week, will have patient see renal again soon. See notes on labs.

## 2013-10-07 NOTE — Assessment & Plan Note (Signed)
Stop metformin, see notes on labs.  Change to glipizide for now.

## 2013-10-08 ENCOUNTER — Other Ambulatory Visit: Payer: Self-pay | Admitting: Family Medicine

## 2013-10-08 NOTE — Telephone Encounter (Signed)
Sent!

## 2013-10-08 NOTE — Telephone Encounter (Signed)
Electronic refill request. Last Filled:   10 tablet 3 RF on   06/23/2013. Please advise.

## 2013-10-09 ENCOUNTER — Other Ambulatory Visit (INDEPENDENT_AMBULATORY_CARE_PROVIDER_SITE_OTHER): Payer: No Typology Code available for payment source

## 2013-10-09 DIAGNOSIS — I10 Essential (primary) hypertension: Secondary | ICD-10-CM

## 2013-10-10 LAB — BASIC METABOLIC PANEL
BUN: 27 mg/dL — AB (ref 6–23)
CALCIUM: 9 mg/dL (ref 8.4–10.5)
CO2: 26 mEq/L (ref 19–32)
Chloride: 99 mEq/L (ref 96–112)
Creat: 2.14 mg/dL — ABNORMAL HIGH (ref 0.50–1.35)
Glucose, Bld: 251 mg/dL — ABNORMAL HIGH (ref 70–99)
Potassium: 4.6 mEq/L (ref 3.5–5.3)
Sodium: 136 mEq/L (ref 135–145)

## 2013-10-19 ENCOUNTER — Ambulatory Visit (INDEPENDENT_AMBULATORY_CARE_PROVIDER_SITE_OTHER): Payer: No Typology Code available for payment source | Admitting: Family Medicine

## 2013-10-19 ENCOUNTER — Telehealth: Payer: Self-pay

## 2013-10-19 ENCOUNTER — Ambulatory Visit (INDEPENDENT_AMBULATORY_CARE_PROVIDER_SITE_OTHER)
Admission: RE | Admit: 2013-10-19 | Discharge: 2013-10-19 | Disposition: A | Discharge status: Home / Self Care | Payer: No Typology Code available for payment source | Source: Ambulatory Visit | Attending: Family Medicine | Admitting: Family Medicine

## 2013-10-19 ENCOUNTER — Encounter: Payer: Self-pay | Admitting: Family Medicine

## 2013-10-19 VITALS — BP 128/82 | HR 64 | Temp 97.9°F | Wt 275.5 lb

## 2013-10-19 DIAGNOSIS — R7989 Other specified abnormal findings of blood chemistry: Secondary | ICD-10-CM

## 2013-10-19 DIAGNOSIS — IMO0001 Reserved for inherently not codable concepts without codable children: Secondary | ICD-10-CM

## 2013-10-19 DIAGNOSIS — E1165 Type 2 diabetes mellitus with hyperglycemia: Secondary | ICD-10-CM

## 2013-10-19 DIAGNOSIS — R209 Unspecified disturbances of skin sensation: Secondary | ICD-10-CM

## 2013-10-19 DIAGNOSIS — R202 Paresthesia of skin: Secondary | ICD-10-CM

## 2013-10-19 DIAGNOSIS — R9389 Abnormal findings on diagnostic imaging of other specified body structures: Secondary | ICD-10-CM

## 2013-10-19 DIAGNOSIS — R799 Abnormal finding of blood chemistry, unspecified: Secondary | ICD-10-CM

## 2013-10-19 DIAGNOSIS — IMO0002 Reserved for concepts with insufficient information to code with codable children: Secondary | ICD-10-CM

## 2013-10-19 MED ORDER — GLIPIZIDE 5 MG PO TABS: ORAL_TABLET | ORAL | Status: DC

## 2013-10-19 MED ORDER — TRAMADOL HCL 50 MG PO TABS: 50.0000 mg | ORAL_TABLET | Freq: Two times a day (BID) | ORAL | Status: DC | PRN

## 2013-10-19 NOTE — Telephone Encounter (Signed)
For 1 week pt has had numbness in both hands; rt hand is worse; pt not sure if weakness in hands but pt having problems writing and combing hair. No pain, no difficulty walking, no vision changes or problems speaking. No H/A, SOB, CP or dizziness. Pt scheduled appt today at 2:15 pm. If pt condition changes or worsens prior to appt pt will cb.

## 2013-10-19 NOTE — Patient Instructions (Signed)
Go to the lab on the way out.  We'll contact you with your xray report. Use the tramadol as needed for neck pain in the meantime.

## 2013-10-19 NOTE — Progress Notes (Signed)
Pre visit review using our clinic review tool, if applicable. No additional management support is needed unless otherwise documented below in the visit note.  Mult issues:  DM2 Sugar has been ~180-200 in the AMs after med change, off metformin. No ADE on meds.  D/w pt about glipizide dose change today.  See instructions.   CKD.  Has f/u with renal for later this week.  Labs d/w pt.  He's off nsaids now.  Cr ~2 now.    R>L hand numbness.  Noted more on the R4th and 5th fingers, L 2nd and 3rd fingers. Consistent in the hands, for the last week or 10 days.  Writing pattern has changed.  R handed.  No tremor.  No hand pain.  Neck pain at baseline.  No trauma.  He has a distant h/o neck injury (years ago) and a FH of migraines.    PMH and SH reviewed  ROS: See HPI, otherwise noncontributory.  Meds, vitals, and allergies reviewed.   nad ncat Neck supple, no LA rrr ctab abd soft, not ttp Ext w/o edema CN 2-12 wnl B, S/S/DTR wnl x4 with the except for subjective change in sensation on R4th and 5th fingers, L 2nd and 3rd fingers. No weakness in the ext B

## 2013-10-20 ENCOUNTER — Encounter: Payer: Self-pay | Admitting: Family Medicine

## 2013-10-20 DIAGNOSIS — R9389 Abnormal findings on diagnostic imaging of other specified body structures: Secondary | ICD-10-CM | POA: Insufficient documentation

## 2013-10-20 DIAGNOSIS — R202 Paresthesia of skin: Secondary | ICD-10-CM | POA: Insufficient documentation

## 2013-10-20 DIAGNOSIS — R209 Unspecified disturbances of skin sensation: Secondary | ICD-10-CM | POA: Insufficient documentation

## 2013-10-20 NOTE — Assessment & Plan Note (Signed)
Will get f/u carotid study.

## 2013-10-20 NOTE — Assessment & Plan Note (Signed)
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  Would use tramadol prn for neck pain in the meantime as this is the likely source given his hx.  He'll update [place] with his sx.

## 2013-10-20 NOTE — Assessment & Plan Note (Signed)
He has f/u with renal pending.

## 2013-10-20 NOTE — Assessment & Plan Note (Signed)
Inc glipizide in the meantime and we can readdress DM2 after seeing renal.

## 2013-10-30 ENCOUNTER — Other Ambulatory Visit: Payer: Self-pay | Admitting: Family Medicine

## 2013-11-13 ENCOUNTER — Encounter (INDEPENDENT_AMBULATORY_CARE_PROVIDER_SITE_OTHER): Payer: No Typology Code available for payment source

## 2013-11-13 DIAGNOSIS — R9389 Abnormal findings on diagnostic imaging of other specified body structures: Secondary | ICD-10-CM

## 2013-11-13 DIAGNOSIS — R209 Unspecified disturbances of skin sensation: Secondary | ICD-10-CM

## 2013-11-13 DIAGNOSIS — I6529 Occlusion and stenosis of unspecified carotid artery: Secondary | ICD-10-CM

## 2013-11-17 ENCOUNTER — Ambulatory Visit: Payer: Self-pay | Admitting: Urology

## 2013-11-18 LAB — HM DIABETES EYE EXAM

## 2013-12-01 ENCOUNTER — Other Ambulatory Visit (INDEPENDENT_AMBULATORY_CARE_PROVIDER_SITE_OTHER): Payer: No Typology Code available for payment source

## 2013-12-01 ENCOUNTER — Telehealth: Payer: Self-pay | Admitting: Family Medicine

## 2013-12-01 DIAGNOSIS — E1165 Type 2 diabetes mellitus with hyperglycemia: Principal | ICD-10-CM

## 2013-12-01 DIAGNOSIS — E119 Type 2 diabetes mellitus without complications: Secondary | ICD-10-CM

## 2013-12-01 DIAGNOSIS — IMO0001 Reserved for inherently not codable concepts without codable children: Secondary | ICD-10-CM

## 2013-12-01 LAB — HEMOGLOBIN A1C
HEMOGLOBIN A1C: 9.1 % — AB (ref <5.7)
Mean Plasma Glucose: 214 mg/dL — ABNORMAL HIGH (ref <117)

## 2013-12-01 NOTE — Telephone Encounter (Signed)
Patient says he had blood drawn today so he thinks it is reasonable to get those results first and then go from there.

## 2013-12-01 NOTE — Telephone Encounter (Signed)
I can refer him if needed but I would like to get his next set of labs done first.  Get the A1c and LDL done and we can see how he is doing- if A1c is better, he may not need endo referral.  If still up, we can talk about other meds vs endo referral.  Thanks.

## 2013-12-01 NOTE — Telephone Encounter (Signed)
Left detailed message on voicemail.  

## 2013-12-01 NOTE — Telephone Encounter (Signed)
Pt saw kidney surgeon, Dr, Jacqlyn Larsen, yesterday and he reccommended pt see and endocrinologist. Please advise!

## 2013-12-02 LAB — LDL CHOLESTEROL, DIRECT: LDL DIRECT: 142 mg/dL — AB

## 2013-12-04 ENCOUNTER — Encounter: Payer: Self-pay | Admitting: *Deleted

## 2013-12-05 ENCOUNTER — Other Ambulatory Visit: Payer: Self-pay | Admitting: Family Medicine

## 2013-12-06 ENCOUNTER — Encounter: Payer: Self-pay | Admitting: Family Medicine

## 2013-12-14 ENCOUNTER — Other Ambulatory Visit: Payer: Self-pay | Admitting: Family Medicine

## 2013-12-17 ENCOUNTER — Encounter (HOSPITAL_COMMUNITY): Payer: Self-pay | Admitting: Emergency Medicine

## 2013-12-17 ENCOUNTER — Emergency Department (INDEPENDENT_AMBULATORY_CARE_PROVIDER_SITE_OTHER)
Admission: EM | Admit: 2013-12-17 | Discharge: 2013-12-17 | Disposition: A | Discharge status: Home / Self Care | Payer: No Typology Code available for payment source | Source: Home / Self Care | Attending: Family Medicine | Admitting: Family Medicine

## 2013-12-17 DIAGNOSIS — R7309 Other abnormal glucose: Secondary | ICD-10-CM

## 2013-12-17 DIAGNOSIS — R739 Hyperglycemia, unspecified: Secondary | ICD-10-CM

## 2013-12-17 DIAGNOSIS — I158 Other secondary hypertension: Secondary | ICD-10-CM

## 2013-12-17 DIAGNOSIS — G43009 Migraine without aura, not intractable, without status migrainosus: Secondary | ICD-10-CM

## 2013-12-17 LAB — POCT I-STAT, CHEM 8
BUN: 32 mg/dL — ABNORMAL HIGH (ref 6–23)
CREATININE: 2 mg/dL — AB (ref 0.50–1.35)
Calcium, Ion: 1.2 mmol/L (ref 1.12–1.23)
Chloride: 103 mEq/L (ref 96–112)
Glucose, Bld: 351 mg/dL — ABNORMAL HIGH (ref 70–99)
HCT: 49 % (ref 39.0–52.0)
Hemoglobin: 16.7 g/dL (ref 13.0–17.0)
POTASSIUM: 5.1 meq/L (ref 3.7–5.3)
SODIUM: 134 meq/L — AB (ref 137–147)
TCO2: 23 mmol/L (ref 0–100)

## 2013-12-17 LAB — GLUCOSE, CAPILLARY: Glucose-Capillary: 297 mg/dL — ABNORMAL HIGH (ref 70–99)

## 2013-12-17 MED ORDER — DIPHENHYDRAMINE HCL 50 MG/ML IJ SOLN
25.0000 mg | Freq: Once | INTRAMUSCULAR | Status: AC
  Administered 2013-12-17: 25 mg via INTRAVENOUS

## 2013-12-17 MED ORDER — SODIUM CHLORIDE 0.9 % IV SOLN
Freq: Once | INTRAVENOUS | Status: AC
  Administered 2013-12-17: 09:00:00 via INTRAVENOUS

## 2013-12-17 MED ORDER — CLONIDINE HCL 0.1 MG PO TABS
0.1000 mg | ORAL_TABLET | Freq: Once | ORAL | Status: AC
  Administered 2013-12-17: 0.1 mg via ORAL

## 2013-12-17 MED ORDER — CLONIDINE HCL 0.1 MG PO TABS: ORAL_TABLET | ORAL | Status: DC

## 2013-12-17 MED ORDER — CLONIDINE HCL 0.1 MG PO TABS
ORAL_TABLET | ORAL | Status: AC
  Filled 2013-12-17: qty 1

## 2013-12-17 MED ORDER — SITAGLIPTIN PHOSPHATE 100 MG PO TABS: 100.0000 mg | ORAL_TABLET | Freq: Every day | ORAL | Status: DC

## 2013-12-17 MED ORDER — KETOROLAC TROMETHAMINE 30 MG/ML IJ SOLN
30.0000 mg | Freq: Once | INTRAMUSCULAR | Status: AC
  Administered 2013-12-17: 30 mg via INTRAVENOUS

## 2013-12-17 MED ORDER — METOCLOPRAMIDE HCL 5 MG/ML IJ SOLN
INTRAMUSCULAR | Status: AC
  Filled 2013-12-17: qty 2

## 2013-12-17 MED ORDER — METOCLOPRAMIDE HCL 5 MG/ML IJ SOLN: 10.0000 mg | Freq: Once | INTRAMUSCULAR | Status: DC

## 2013-12-17 MED ORDER — KETOROLAC TROMETHAMINE 60 MG/2ML IM SOLN: 60.0000 mg | Freq: Once | INTRAMUSCULAR | Status: DC

## 2013-12-17 MED ORDER — DIPHENHYDRAMINE HCL 50 MG/ML IJ SOLN
INTRAMUSCULAR | Status: AC
  Filled 2013-12-17: qty 1

## 2013-12-17 MED ORDER — METOCLOPRAMIDE HCL 5 MG/ML IJ SOLN
10.0000 mg | Freq: Once | INTRAMUSCULAR | Status: AC
  Administered 2013-12-17: 10 mg via INTRAVENOUS

## 2013-12-17 MED ORDER — KETOROLAC TROMETHAMINE 60 MG/2ML IM SOLN
INTRAMUSCULAR | Status: AC
  Filled 2013-12-17: qty 2

## 2013-12-17 NOTE — Discharge Instructions (Signed)
Migraine Headache A migraine headache is an intense, throbbing pain on one or both sides of your head. A migraine can last for 30 minutes to several hours. CAUSES  The exact cause of a migraine headache is not always known. However, a migraine may be caused when nerves in the brain become irritated and release chemicals that cause inflammation. This causes pain. Certain things may also trigger migraines, such as:  Alcohol.  Smoking.  Stress.  Menstruation.  Aged cheeses.  Foods or drinks that contain nitrates, glutamate, aspartame, or tyramine.  Lack of sleep.  Chocolate.  Caffeine.  Hunger.  Physical exertion.  Fatigue.  Medicines used to treat chest pain (nitroglycerine), birth control pills, estrogen, and some blood pressure medicines. SIGNS AND SYMPTOMS  Pain on one or both sides of your head.  Pulsating or throbbing pain.  Severe pain that prevents daily activities.  Pain that is aggravated by any physical activity.  Nausea, vomiting, or both.  Dizziness.  Pain with exposure to bright lights, loud noises, or activity.  General sensitivity to bright lights, loud noises, or smells. Before you get a migraine, you may get warning signs that a migraine is coming (aura). An aura may include:  Seeing flashing lights.  Seeing bright spots, halos, or zigzag lines.  Having tunnel vision or blurred vision.  Having feelings of numbness or tingling.  Having trouble talking.  Having muscle weakness. DIAGNOSIS  A migraine headache is often diagnosed based on:  Symptoms.  Physical exam.  A CT scan or MRI of your head. These imaging tests cannot diagnose migraines, but they can help rule out other causes of headaches. TREATMENT Medicines may be given for pain and nausea. Medicines can also be given to help prevent recurrent migraines.  HOME CARE INSTRUCTIONS  Only take over-the-counter or prescription medicines for pain or discomfort as directed by your  health care provider. The use of long-term narcotics is not recommended.  Lie down in a dark, quiet room when you have a migraine.  Keep a journal to find out what may trigger your migraine headaches. For example, write down:  What you eat and drink.  How much sleep you get.  Any change to your diet or medicines.  Limit alcohol consumption.  Quit smoking if you smoke.  Get 7-9 hours of sleep, or as recommended by your health care provider.  Limit stress.  Keep lights dim if bright lights bother you and make your migraines worse. SEEK IMMEDIATE MEDICAL CARE IF:   Your migraine becomes severe.  You have a fever.  You have a stiff neck.  You have vision loss.  You have muscular weakness or loss of muscle control.  You start losing your balance or have trouble walking.  You feel faint or pass out.  You have severe symptoms that are different from your first symptoms. MAKE SURE YOU:   Understand these instructions.  Will watch your condition.  Will get help right away if you are not doing well or get worse. Document Released: 05/07/2005 Document Revised: 09/21/2013 Document Reviewed: 01/12/2013 [facility] Patient Information 2015 [place], [place]. This information is not intended to replace advice given to you by your health care provider. Make sure you discuss any questions you have with your health care provider.  Hyperglycemia Hyperglycemia occurs when the glucose (sugar) in your blood is too high. Hyperglycemia can happen for many reasons, but it most often happens to people who do not know they have diabetes or are not managing their diabetes  properly.  CAUSES  Whether you have diabetes or not, there are other causes of hyperglycemia. Hyperglycemia can occur when you have diabetes, but it can also occur in other situations that you might not be as aware of, such as: Diabetes  If you have diabetes and are having problems controlling your blood glucose, hyperglycemia  could occur because of some of the following reasons:  Not following your meal plan.  Not taking your diabetes medications or not taking it properly.  Exercising less or doing less activity than you normally do.  Being sick. Pre-diabetes  This cannot be ignored. Before people develop Type 2 diabetes, they almost always have "pre-diabetes." This is when your blood glucose levels are higher than normal, but not yet high enough to be diagnosed as diabetes. Research has shown that some long-term damage to the body, especially the heart and circulatory system, may already be occurring during pre-diabetes. If you take action to manage your blood glucose when you have pre-diabetes, you may delay or prevent Type 2 diabetes from developing. Stress  If you have diabetes, you may be "diet" controlled or on oral medications or insulin to control your diabetes. However, you may find that your blood glucose is higher than usual in the hospital whether you have diabetes or not. This is often referred to as "stress hyperglycemia." Stress can elevate your blood glucose. This happens because of hormones put out by the body during times of stress. If stress has been the cause of your high blood glucose, it can be followed regularly by your caregiver. That way he/she can make sure your hyperglycemia does not continue to get worse or progress to diabetes. Steroids  Steroids are medications that act on the infection fighting system (immune system) to block inflammation or infection. One side effect can be a rise in blood glucose. Most people can produce enough extra insulin to allow for this rise, but for those who cannot, steroids make blood glucose levels go even higher. It is not unusual for steroid treatments to "uncover" diabetes that is developing. It is not always possible to determine if the hyperglycemia will go away after the steroids are stopped. A special blood test called an A1c is sometimes done to  determine if your blood glucose was elevated before the steroids were started. SYMPTOMS  Thirsty.  Frequent urination.  Dry mouth.  Blurred vision.  Tired or fatigue.  Weakness.  Sleepy.  Tingling in feet or leg. DIAGNOSIS  Diagnosis is made by monitoring blood glucose in one or all of the following ways:  A1c test. This is a chemical found in your blood.  Fingerstick blood glucose monitoring.  Laboratory results. TREATMENT  First, knowing the cause of the hyperglycemia is important before the hyperglycemia can be treated. Treatment may include, but is not be limited to:  Education.  Change or adjustment in medications.  Change or adjustment in meal plan.  Treatment for an illness, infection, etc.  More frequent blood glucose monitoring.  Change in exercise plan.  Decreasing or stopping steroids.  Lifestyle changes. HOME CARE INSTRUCTIONS   Test your blood glucose as directed.  Exercise regularly. Your caregiver will give you instructions about exercise. Pre-diabetes or diabetes which comes on with stress is helped by exercising.  Eat wholesome, balanced meals. Eat often and at regular, fixed times. Your caregiver or nutritionist will give you a meal plan to guide your sugar intake.  Being at an ideal weight is important. If needed, losing as little as  10 to 15 pounds may help improve blood glucose levels. SEEK MEDICAL CARE IF:   You have questions about medicine, activity, or diet.  You continue to have symptoms (problems such as increased thirst, urination, or weight gain). SEEK IMMEDIATE MEDICAL CARE IF:   You are vomiting or have diarrhea.  Your breath smells fruity.  You are breathing faster or slower.  You are very sleepy or incoherent.  You have numbness, tingling, or pain in your feet or hands.  You have chest pain.  Your symptoms get worse even though you have been following your caregiver's orders.  If you have any other questions or  concerns. Document Released: 10/31/2000 Document Revised: 07/30/2011 Document Reviewed: 09/03/2011 [facility] Patient Information 2015 [place], [place]. This information is not intended to replace advice given to you by your health care provider. Make sure you discuss any questions you have with your health care provider.  Hypertension Hypertension, commonly called high blood pressure, is when the force of blood pumping through your arteries is too strong. Your arteries are the blood vessels that carry blood from your heart throughout your body. A blood pressure reading consists of a higher number over a lower number, such as 110/72. The higher number (systolic) is the pressure inside your arteries when your heart pumps. The lower number (diastolic) is the pressure inside your arteries when your heart relaxes. Ideally you want your blood pressure below 120/80. Hypertension forces your heart to work harder to pump blood. Your arteries may become narrow or stiff. Having hypertension puts you at risk for heart disease, stroke, and other problems.  RISK FACTORS Some risk factors for high blood pressure are controllable. Others are not.  Risk factors you cannot control include:   Race. You may be at higher risk if you are African American.  Age. Risk increases with age.  Gender. Men are at higher risk than women before age 16 years. After age 53, women are at higher risk than men. Risk factors you can control include:  Not getting enough exercise or physical activity.  Being overweight.  Getting too much fat, sugar, calories, or salt in your diet.  Drinking too much alcohol. SIGNS AND SYMPTOMS Hypertension does not usually cause signs or symptoms. Extremely high blood pressure (hypertensive crisis) may cause headache, anxiety, shortness of breath, and nosebleed. DIAGNOSIS  To check if you have hypertension, your health care provider will measure your blood pressure while you are seated, with your  arm held at the level of your heart. It should be measured at least twice using the same arm. Certain conditions can cause a difference in blood pressure between your right and left arms. A blood pressure reading that is higher than normal on one occasion does not mean that you need treatment. If one blood pressure reading is high, ask your health care provider about having it checked again. TREATMENT  Treating high blood pressure includes making lifestyle changes and possibly taking medicine. Living a healthy lifestyle can help lower high blood pressure. You may need to change some of your habits. Lifestyle changes may include:  Following the DASH diet. This diet is high in fruits, vegetables, and whole grains. It is low in salt, red meat, and added sugars.  Getting at least 2 hours of brisk physical activity every week.  Losing weight if necessary.  Not smoking.  Limiting alcoholic beverages.  Learning ways to reduce stress. If lifestyle changes are not enough to get your blood pressure under control, your health  care provider may prescribe medicine. You may need to take more than one. Work closely with your health care provider to understand the risks and benefits. HOME CARE INSTRUCTIONS  Have your blood pressure rechecked as directed by your health care provider.   Take medicines only as directed by your health care provider. Follow the directions carefully. Blood pressure medicines must be taken as prescribed. The medicine does not work as well when you skip doses. Skipping doses also puts you at risk for problems.   Do not smoke.   Monitor your blood pressure at home as directed by your health care provider. SEEK MEDICAL CARE IF:   You think you are having a reaction to medicines taken.  You have recurrent headaches or feel dizzy.  You have swelling in your ankles.  You have trouble with your vision. SEEK IMMEDIATE MEDICAL CARE IF:  You develop a severe headache or  confusion.  You have unusual weakness, numbness, or feel faint.  You have severe chest or abdominal pain.  You vomit repeatedly.  You have trouble breathing. MAKE SURE YOU:   Understand these instructions.  Will watch your condition.  Will get help right away if you are not doing well or get worse. Document Released: 05/07/2005 Document Revised: 09/21/2013 Document Reviewed: 02/27/2013 [facility] Patient Information 2015 [place], [place]. This information is not intended to replace advice given to you by your health care provider. Make sure you discuss any questions you have with your health care provider.

## 2013-12-17 NOTE — ED Provider Notes (Signed)
CSN: [id number]     Arrival date & time 12/17/13  0808 History   First MD Initiated Contact with Patient 12/17/13 [phone]     Chief Complaint  Patient presents with  . Migraine   (Consider location/radiation/quality/duration/timing/severity/associated sxs/prior Treatment) HPI Comments: 58 year old male with a history of migraines, also hypertension and diabetes, presents complaining of migraine headache for 3 days. He has a constant headache on the right side of his head , From the forehead to the back of the head. The severity has waxed and waned but it has never gone away. He has taken Imitrex and ibuprofen with minimal relief. he has had nausea without vomiting, and photophobia. This is identical to previous migraines. He also notes that he ran of his blood pressure medication the day before this migraine started - clonidine, he was taking 6 0.1 mg tablets per day. He continues to take his diabetes medications as prescribed. No blurred double vision, extremity weakness or numbness, speech difficulties   Patient is a 58 y.o. male presenting with migraines.  Migraine Associated symptoms include headaches. Pertinent negatives include no chest pain, no abdominal pain and no shortness of breath.    Past Medical History  Diagnosis Date  . Hyperlipidemia 05/21/1988  . Hypertension 05/21/1978  . Gout 05/21/1985  . GERD (gastroesophageal reflux disease) 05/21/1978  . Migraines     Better after retirement  . Diabetes mellitus, type 2   . History of kidney stones   . Hyperparathyroidism, secondary renal   . Anemia in chronic kidney disease   . Allergic rhinitis   . Hydronephrosis, right   . Renal cyst, left   . CKD (chronic kidney disease), stage III     NEPHROLOGIST-  DR Lavonia Dana  . Hematuria    Past Surgical History  Procedure Laterality Date  . Partial nephrectomy  12/19/1977    RIGHT PYELOPLASTY AND removal benign cyst and stones  . Cystoscopy w/ ureteral stent placement Right  11/25/2012    Procedure: CYSTOSCOPY WITH RETROGRADE PYELOGRAM/URETERAL STENT PLACEMENT;  Surgeon: Hanley Ben, MD;  Location: [place];  Service: Urology;  Laterality: Right;  . Cystoscopy/retrograde/ureteroscopy Right 02/10/2013    Procedure: CYSTOSCOPY/RETROGRADE/URETEROSCOPY Right stent removal  Procedure: Cysto, Right Retrograde, Right Stent Pull, Right Diagnostic Ureteroscopy;  Surgeon: Alexis Frock, MD;  Location: [facility];  Service: Urology;  Laterality: Right;   Family History  Problem Relation Age of Onset  . Heart disease Mother   . Diabetes Mother   . Hypertension Mother   . Cancer Father     Lung  . Diabetes Sister   . Heart disease Maternal Grandfather   . Diabetes Maternal Grandfather   . Stroke Neg Hx   . Prostate cancer Neg Hx   . Colon cancer Neg Hx    History  Substance Use Topics  . Smoking status: Former Smoker -- 6 years    Quit date: 11/17/1977  . Smokeless tobacco: Never Used  . Alcohol Use: No    Review of Systems  Constitutional: Positive for fatigue. Negative for fever and chills.  HENT: Negative for sore throat.   Eyes: Positive for photophobia. Negative for visual disturbance.  Respiratory: Negative for cough and shortness of breath.   Cardiovascular: Negative for chest pain, palpitations and leg swelling.  Gastrointestinal: Positive for nausea. Negative for vomiting, abdominal pain, diarrhea and constipation.  Genitourinary: Negative for dysuria, urgency, frequency and hematuria.  Musculoskeletal: Negative for arthralgias, myalgias, neck pain and neck stiffness.  Skin: Negative  for rash.  Neurological: Positive for headaches. Negative for dizziness, weakness and light-headedness.  All other systems reviewed and are negative.   Allergies  Codeine sulfate and Sildenafil citrate  Home Medications   Prior to Admission medications   Medication Sig Start Date End Date Taking? Authorizing Provider  glipiZIDE  (GLUCOTROL) 5 MG tablet 2 tabs in the AM and 1 in the PM 10/19/13  Yes Tonia Ghent, MD  metoprolol succinate (TOPROL-XL) 100 MG 24 hr tablet TAKE ONE TABLET BY MOUTH ONE TIME DAILY IMMEDIATELY FOLLOWING MEAL  10/02/13  Yes Tonia Ghent, MD  SUMAtriptan (IMITREX) 100 MG tablet TAKE 1 TABLET BY MOUTH EVERY 2 HOURS AS NEEDED FOR MIGRAINE OR HEADACHE **MAX OF 2 DOSES IN 24 HOURS ** 10/08/13  Yes Tonia Ghent, MD  allopurinol (ZYLOPRIM) 100 MG tablet TAKE ONE TABLET BY MOUTH ONE TIME DAILY  10/02/13   Tonia Ghent, MD  allopurinol (ZYLOPRIM) 300 MG tablet TAKE ONE TABLET BY MOUTH ONE TIME DAILY  10/30/13   Tonia Ghent, MD  aspirin 81 MG tablet Take 162 mg by mouth daily.    Historical Provider, MD  cloNIDine (CATAPRES) 0.1 MG tablet TAKE THREE TABLETS BY MOUTH TWO TIMES A DAY  08/10/13   Tonia Ghent, MD  cloNIDine (CATAPRES) 0.1 MG tablet Take 1 tablet in the morning, 2 in the afternoon, and 3 at night 12/17/13   Liam Graham, PA-C  Coenzyme Q10 (COQ-10 PO) Take 300 mg by mouth daily.    Historical Provider, MD  colchicine 0.6 MG tablet Take 0.6 mg by mouth as needed.  10/28/10   Tonia Ghent, MD  desonide (DESOWEN) 0.05 % lotion Apply topically 2 (two) times daily as needed. Do not use on the face. 09/29/12   Tonia Ghent, MD  fexofenadine (ALLEGRA) 180 MG tablet Take 180 mg by mouth daily as needed.     Historical Provider, MD  magnesium gluconate (MAGONATE) 500 MG tablet Take 500 mg by mouth daily.    Historical Provider, MD  Multiple Vitamin (MULTIVITAMIN) tablet Take 1 tablet by mouth daily.      Historical Provider, MD  niacin 500 MG tablet Take 500 mg by mouth 2 (two) times daily with a meal.      Historical Provider, MD  sitaGLIPtin (JANUVIA) 100 MG tablet Take 1 tablet (100 mg total) by mouth daily. 12/17/13   Liam Graham, PA-C  traMADol (ULTRAM) 50 MG tablet Take 1 tablet (50 mg total) by mouth every 12 (twelve) hours as needed for moderate pain. 10/19/13   Tonia Ghent,  MD  zinc gluconate 50 MG tablet Take 50 mg by mouth daily.    Historical Provider, MD   BP 158/104  Pulse 61  Temp(Src) 97.8 F (36.6 C) (Oral)  Resp 20  SpO2 96% Physical Exam  Nursing note and vitals reviewed. Constitutional: He is oriented to person, place, and time. He appears well-developed and well-nourished. No distress.  HENT:  Head: Normocephalic and atraumatic.  Right Ear: External ear normal.  Left Ear: External ear normal.  Nose: Nose normal.  Mouth/Throat: Oropharynx is clear and moist. No oropharyngeal exudate.  Eyes: Conjunctivae and EOM are normal. Pupils are equal, round, and reactive to light. Right eye exhibits no discharge. Left eye exhibits no discharge. No scleral icterus.  Cardiovascular: Normal rate, regular rhythm and normal heart sounds.   Pulmonary/Chest: Effort normal and breath sounds normal. No respiratory distress.  Neurological: He is alert and  oriented to person, place, and time. He has normal strength and normal reflexes. He is not disoriented. No cranial nerve deficit or sensory deficit. He exhibits normal muscle tone. He displays a negative Romberg sign. Coordination and gait normal. GCS eye subscore is 4. GCS verbal subscore is 5. GCS motor subscore is 6.  Skin: Skin is warm and dry. No rash noted. He is not diaphoretic.  Psychiatric: He has a normal mood and affect. Judgment normal.    ED Course  Procedures (including critical care time) Labs Review Labs Reviewed  GLUCOSE, CAPILLARY - Abnormal; Notable for the following:    Glucose-Capillary 297 (*)    All other components within normal limits  POCT I-STAT, CHEM 8 - Abnormal; Notable for the following:    Sodium 134 (*)    BUN 32 (*)    Creatinine, Ser 2.00 (*)    Glucose, Bld 351 (*)    All other components within normal limits    Imaging Review No results found.   MDM   1. Nonintractable migraine, unspecified migraine type   2. Hyperglycemia   3. Rebound hypertension    After  1 L of normal saline, Reglan, Benadryl, Toradol, and 0.2 mg clonidine, headache is down to 2/10 in severity. Blood pressure is 158/100. I believe the main reason for his headache today is rebound hypertension due to stopping the clonidine suddenly. Will refill his clonidine. Also he was recently taken off metformin due to elevated serum creatinine, an attempt was made to compensate for this by increasing glipizide but his blood sugars were still 350 today. He has an appointment with an endocrinologist, in the meantime I will start him on Januvia. I suspect he will need insulin. CBG down to 290 prior to discharge. he should followup with primary care, or emergency department if worsening.   Meds ordered this encounter  Medications  . 0.9 %  sodium chloride infusion    Sig:   . DISCONTD: ketorolac (TORADOL) injection 60 mg    Sig:   . DISCONTD: metoCLOPramide (REGLAN) injection 10 mg    Sig:   . diphenhydrAMINE (BENADRYL) injection 25 mg    Sig:   . cloNIDine (CATAPRES) tablet 0.1 mg    Sig:   . ketorolac (TORADOL) 30 MG/ML injection 30 mg    Sig:   . metoCLOPramide (REGLAN) injection 10 mg    Sig:   . cloNIDine (CATAPRES) tablet 0.1 mg    Sig:   . cloNIDine (CATAPRES) 0.1 MG tablet    Sig: Take 1 tablet in the morning, 2 in the afternoon, and 3 at night    Dispense:  180 tablet    Refill:  0    Order Specific Question:  Supervising Provider    Answer:  Lynne Leader, [place]  . sitaGLIPtin (JANUVIA) 100 MG tablet    Sig: Take 1 tablet (100 mg total) by mouth daily.    Dispense:  30 tablet    Refill:  0    Order Specific Question:  Supervising Provider    Answer:  Lynne Leader, [place]     Liam Graham, PA-C 12/17/13 [phone]

## 2013-12-17 NOTE — ED Notes (Signed)
Pt reports constant migraine HA x 3 days; hx of migraine HA Taking Imitrex w/no relief.  BP today is 197/106 Denies CP, SOB, weakness, diaphoresis Taking BP daily He is alert and talking in complete sentences w/no signs of acute distress.

## 2013-12-18 NOTE — ED Provider Notes (Signed)
Medical screening examination/treatment/procedure(s) were performed by a resident physician or non-physician practitioner and as the supervising physician I was immediately available for consultation/collaboration.  Lynne Leader, MD    Gregor Hams, MD 12/18/13 [phone]

## 2013-12-22 ENCOUNTER — Telehealth: Payer: Self-pay | Admitting: *Deleted

## 2013-12-22 NOTE — Telephone Encounter (Signed)
Notification of interaction between clonidine and metoprolol. Form in your IN box for review.

## 2013-12-23 NOTE — Telephone Encounter (Signed)
Not indicated to change meds at this point.

## 2014-01-02 ENCOUNTER — Other Ambulatory Visit: Payer: Self-pay | Admitting: Family Medicine

## 2014-01-11 ENCOUNTER — Telehealth: Payer: Self-pay | Admitting: Family Medicine

## 2014-01-11 NOTE — Telephone Encounter (Signed)
Patient Information:  Caller Name: Rhone  Phone: [phone]  Patient: Luis Joseph, Luis Joseph  Gender: Male  DOB: 06-19-1955  Age: 58 Years  PCP: Elsie Stain Brigitte Pulse) [facility])  Office Follow Up:  Does the office need to follow up with this patient?: Yes  Instructions For The Office: Numbness in face, hand, and leg  RN Note:  Patient is calling regarding numbness on left side of face, arm, and leg.  Denies any weakness.  Able to preform normal ADL's.  Denies change in facial appearance or speech.  Orient to time and place.  States he's been driving without any problem.  Symptoms  Reason For Call & Symptoms: face numbness, leg, arm all on left side  Reviewed Health History In EMR: Yes  Reviewed Medications In EMR: Yes  Reviewed Allergies In EMR: Yes  Reviewed Surgeries / Procedures: Yes  Date of Onset of Symptoms: 01/08/2014  Guideline(s) Used:  Neurologic Deficit  Disposition Per Guideline:   Go to Office Now  Reason For Disposition Reached:   Neurologic deficit that was transient (now gone), ANY of the following:   Weakness of the face, arm, or leg on one side of the body  Numbness of the face, arm, or leg on one side of the body  Loss of speech or garbled speech  Advice Given:  Call Back If:  You become worse.  Patient Will Follow Care Advice:  YES

## 2014-01-11 NOTE — Telephone Encounter (Signed)
Appointment scheduled 01/13/14 at 2:15 pm.

## 2014-01-11 NOTE — Telephone Encounter (Signed)
If he has no sx now and Date of Onset of Symptoms: 01/08/2014, then needs outpatient OV scheduled.   If any new sx, to ER.  Thanks.

## 2014-01-11 NOTE — Telephone Encounter (Signed)
[facility] called to check on response.  Dr. Damita Dunnings just sent the response minutes ago and when Marlowe Kays at [facility] refreshed the computer, she could see the results and will handle it.

## 2014-01-11 NOTE — Telephone Encounter (Signed)
Left message on voicemail.  Needs to call office for visit.

## 2014-01-13 ENCOUNTER — Ambulatory Visit (INDEPENDENT_AMBULATORY_CARE_PROVIDER_SITE_OTHER): Payer: No Typology Code available for payment source | Admitting: Family Medicine

## 2014-01-13 ENCOUNTER — Ambulatory Visit (INDEPENDENT_AMBULATORY_CARE_PROVIDER_SITE_OTHER)
Admission: RE | Admit: 2014-01-13 | Discharge: 2014-01-13 | Disposition: A | Discharge status: Home / Self Care | Payer: No Typology Code available for payment source | Source: Ambulatory Visit | Attending: Family Medicine | Admitting: Family Medicine

## 2014-01-13 ENCOUNTER — Encounter: Payer: Self-pay | Admitting: Family Medicine

## 2014-01-13 VITALS — BP 140/90 | HR 72 | Temp 98.3°F | Wt 282.0 lb

## 2014-01-13 DIAGNOSIS — R202 Paresthesia of skin: Secondary | ICD-10-CM

## 2014-01-13 DIAGNOSIS — R200 Anesthesia of skin: Secondary | ICD-10-CM

## 2014-01-13 DIAGNOSIS — R209 Unspecified disturbances of skin sensation: Secondary | ICD-10-CM

## 2014-01-13 MED ORDER — INSULIN GLARGINE 100 UNIT/ML SOLOSTAR PEN: PEN_INJECTOR | SUBCUTANEOUS | Status: DC

## 2014-01-13 MED ORDER — ASPIRIN 325 MG PO TABS: 325.0000 mg | ORAL_TABLET | Freq: Every day | ORAL | Status: DC

## 2014-01-13 MED ORDER — INSULIN PEN NEEDLE 31G X 5 MM MISC: Status: DC

## 2014-01-13 NOTE — Progress Notes (Signed)
Pre visit review using our clinic review tool, if applicable. No additional management support is needed unless otherwise documented below in the visit note.  He's having gastric bypass surgery 03/01/14 (Dr. Hassell Done).  He isn't on insulin at this point.  D/w pt his sugars.  Usually 200s, not on januvia due to cost.    Recently, >5 days with L sided numbness.  L sided facial numbness, some L arm and leg numbness.  Arm and leg sx are some better, not resolved.  Facial sx not changed.  No trunk sx.  Not SOB, no CP.  No speech or swallowing changes.  "It feels like I had novacaine on my lips."   He has had some positional vertigo, when up and standing.  No focal weakness in the ext or face.  That predates the numbness by several weeks.    PMH and SH reviewed  ROS: See HPI, otherwise noncontributory.  Meds, vitals, and allergies reviewed.   nad ncat OP wnl Neck supple, no LA No bruit B rrr ctab Abd soft, not ttp Ext w/o edema L facial dec in sensation noted, less pronounced on L arm and leg. CN 2-12 wnl B, S/S/DTR wnl x4 o/w.

## 2014-01-13 NOTE — Assessment & Plan Note (Signed)
Concern for CVA but CT neg.  At this point, I wouldn't get MRI as it wouldn't likely change mgmt.  If CVA seen on MRI plan would be for higher dose of ASA, up to 325 mg a day.  If no CVA seen on MRI, I would still likely inc his ASA dose for now given the sudden onset and the slow regression of L sided sx.  Will work on DM2 control, new insulin start and will need instruction.  Lipids prev up, but DM2 likely more important, especially with surgery pending.  Will notify surgery clinic in meantime.  Carotids prev w/o sig stenosis, done 2015.  BP acceptable for now.

## 2014-01-13 NOTE — Patient Instructions (Signed)
Rosaria Ferries will call about your referral- see her on the way out.  Take care.  We'll be in touch.  If you have any new symptoms at all, then go to the ER or dial 911.

## 2014-01-14 ENCOUNTER — Other Ambulatory Visit: Payer: Self-pay | Admitting: Family Medicine

## 2014-01-14 NOTE — Telephone Encounter (Signed)
Electronic refill request. Last Filled:   10 tablet 2 RF on   10/08/2013.  Please advise.

## 2014-01-21 ENCOUNTER — Telehealth: Payer: Self-pay | Admitting: Family Medicine

## 2014-01-21 NOTE — Telephone Encounter (Signed)
Noted, thanks!

## 2014-01-21 NOTE — Telephone Encounter (Signed)
Call pt.  BP was up at renal office.  I talked to dr. Juleen China yesterday.   Have pt check BP at home and notify us if elevated. Has patient had DM2 teaching at [facility] yet? Thanks.

## 2014-01-21 NOTE — Telephone Encounter (Signed)
Left message on patient's voicemail to return call

## 2014-01-21 NOTE — Telephone Encounter (Signed)
Pt left v/m; pt can do BP thing without problem and pt has gone by [place] and was shown what to do about insulin and pt will be starting tomorrow.

## 2014-01-31 ENCOUNTER — Other Ambulatory Visit: Payer: Self-pay | Admitting: Family Medicine

## 2014-02-08 ENCOUNTER — Encounter: Payer: No Typology Code available for payment source | Attending: Surgery

## 2014-02-08 VITALS — Ht 71.0 in | Wt 277.5 lb

## 2014-02-08 DIAGNOSIS — Z713 Dietary counseling and surveillance: Secondary | ICD-10-CM | POA: Diagnosis not present

## 2014-02-08 DIAGNOSIS — Z6838 Body mass index (BMI) 38.0-38.9, adult: Secondary | ICD-10-CM | POA: Insufficient documentation

## 2014-02-08 DIAGNOSIS — E669 Obesity, unspecified: Secondary | ICD-10-CM | POA: Diagnosis not present

## 2014-02-08 NOTE — Progress Notes (Signed)
  Pre-Operative Nutrition Class:  Appt start time: 6962   End time:  1830.  Patient was seen on 02/08/2014 for Pre-Operative Bariatric Surgery Education at [facility].   Surgery date: 03/01/2014 Surgery type: RYGB Start weight at [facility]: 275 lbs on 02/03/14 Weight today: 277.5 lbs  TANITA  BODY COMP RESULTS  02/08/14   BMI (kg/m^2) 38.7   Fat Mass (lbs) 102.5   Fat Free Mass (lbs) 175   Total Body Water (lbs) 128   Samples given per MNT protocol. Patient educated on appropriate usage: Premier protein shake (qty 1 - vanilla) Lot #: [id number] Exp: 09/2014  PB2 Lot #: [id number] (qty 1) Exp: 12/2014  Renee Pain protein powder (qty 1 - unflavored) Lot #: [id number] Exp: 03/2015  Celebrate Multivitamin (qty 1 - orange) Lot #: [id number] Exp: 08/2014  The following the learning objectives were met by the patient during this course:  Identify Pre-Op Dietary Goals and will begin 2 weeks pre-operatively  Identify appropriate sources of fluids and proteins   State protein recommendations and appropriate sources pre and post-operatively  Identify Post-Operative Dietary Goals and will follow for 2 weeks post-operatively  Identify appropriate multivitamin and calcium sources  Describe the need for physical activity post-operatively and will follow MD recommendations  State when to call healthcare provider regarding medication questions or post-operative complications  Handouts given during class include:  Pre-Op Bariatric Surgery Diet Handout  Protein Shake Handout  Post-Op Bariatric Surgery Nutrition Handout  BELT Program Information Flyer  Support Group Information Flyer  [facility] Bariatric Supplements Price List  Follow-Up Plan: Patient will follow-up at [facility] 2 weeks post operatively for diet advancement per MD.

## 2014-02-15 ENCOUNTER — Encounter: Payer: Self-pay | Admitting: Family Medicine

## 2014-02-15 ENCOUNTER — Ambulatory Visit (INDEPENDENT_AMBULATORY_CARE_PROVIDER_SITE_OTHER): Payer: No Typology Code available for payment source | Admitting: Family Medicine

## 2014-02-15 VITALS — BP 130/86 | HR 68 | Temp 98.0°F | Wt 282.5 lb

## 2014-02-15 DIAGNOSIS — E669 Obesity, unspecified: Secondary | ICD-10-CM

## 2014-02-15 DIAGNOSIS — Z01818 Encounter for other preprocedural examination: Secondary | ICD-10-CM

## 2014-02-15 NOTE — Progress Notes (Signed)
Pre visit review using our clinic review tool, if applicable. No additional management support is needed unless otherwise documented below in the visit note.  DM2.  Sugar 368 this AM but noted high sugar foods recently.  Before that he had AM sugars in the 250s.  Now on pre op diet for surgery, starting today. He is going to titrate lantus based on his AM readings. Discussed.    Gastric bypass planned for 03/01/14.  H/o DM2, see above. BP controlled. H/o CKD per renal, saw renal 3 days ago.   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  Still with L lip numbness, improved from prev.  No new neuro sx in the meantime.  Normal EKG 11/2013, mild carotid disease in 10/2013.  No other new neuro sx.  No CP, no SOB, has occ trace BLE edema.   We talked about his surgical risk.  He has tolerated anesthesia prev.    Meds, vitals, and allergies reviewed.   ROS: See HPI.  Otherwise, noncontributory.  GEN: nad, alert and oriented, obese HEENT: mucous membranes moist NECK: supple w/o LA CV: rrr.  PULM: ctab, no inc wob ABD: soft, +bs EXT: no edema SKIN: no acute rash CN 2-12 wnl B, S/S wnl x4 except for numbness on the L lips noted.

## 2014-02-15 NOTE — Patient Instructions (Signed)
I'll check with Dr. Hassell Done in the meantime.   Update me with your sugar reading and insulin dose at the end of this week.  Take care.  Glad to see you.

## 2014-02-16 NOTE — Assessment & Plan Note (Addendum)
He has plan for gastric bypass.  He'll be cutting carbs and titrating his lantus in the meantime.  That should improve his sugars.  There is the question of the CVA from earlier this year.  I didn't order MR as CT was neg and we were going to treat him with ASA even if the MRI were negative.  At this point, his carotid study was unremarkable, he has no CP or new sx and he appears to be acceptably low risk for surgery.  We talked about the benefit of waiting to get better DM2 control vs the benefit of the surgery itself.  At this point, it seems reasonable to proceed with surgery.  I presume that surgery will want patient off ASA for 1 week preop, will ask for surgery input on that.  I appreciate help from surgery and all others involved.  >25 minutes spent in face to face time with patient, >50% spent in counselling or coordination of care.

## 2014-02-17 NOTE — Progress Notes (Signed)
Please put orders in Epic surgery 03-01-14 pre op 02-24-14 Thanks

## 2014-02-18 ENCOUNTER — Encounter (HOSPITAL_COMMUNITY): Payer: Self-pay | Admitting: Pharmacy Technician

## 2014-02-19 ENCOUNTER — Ambulatory Visit (INDEPENDENT_AMBULATORY_CARE_PROVIDER_SITE_OTHER): Payer: No Typology Code available for payment source | Admitting: Surgery

## 2014-02-19 ENCOUNTER — Encounter: Payer: Self-pay | Admitting: Family Medicine

## 2014-02-23 NOTE — Patient Instructions (Addendum)
[place]  02/24/2014   Your procedure is scheduled on: 10-12  -2015 Monday  Enter through [facility] Entrance and follow signs to [facility]. Arrive at       [place]  AM  Call this number if you have problems the morning of surgery: [phone]  Or Presurgical Testing [phone].   For Living Will and/or Health Care Power Attorney Forms: please provide copy for your medical record,may bring AM of surgery(Forms should be already notarized -we do not provide this service).(02-24-14  No information preferred today).  Remember: Follow any bowel prep instructions per MD office. For Cpap use: Bring mask and tubing only.   Do not eat food/ or drink: After Midnight.    Take these medicines the morning of surgery with A SIP OF WATER: Allopurinol. Clonidine. Allegra. Metoprolol. Use Lantus(1/2 usual PM dose night before) take no insulin or Diabetic meds AM of.   Do not wear jewelry, make-up or nail polish.  Do not wear lotions, powders, or perfumes. You may not  wear deodorant.  Do not shave 48 hours(2 days) prior to first CHG shower(legs and under arms).(Shaving face and neck okay.)  Do not bring valuables to the hospital.(Hospital is not responsible for lost valuables).  Contacts, dentures or removable bridgework, body piercing, hair pins may not be worn into surgery.  Leave suitcase in the car. After surgery it may be brought to your room.  For patients admitted to the hospital, checkout time is 11:00 AM the day of discharge.(Restricted visitors-Any Persons displaying flu-like symptoms or illness).    Patients discharged the day of surgery will not be allowed to drive home. Must have responsible person with you x 24 hours once discharged.  Name and phone number of your driver: Cecille Rubin- spouse [phone] cell  Special Instructions: CHG(Chlorhedine 4%-"Hibiclens","Betasept","Aplicare") Shower Use Special Wash: see special instructions.(attached)     _____________     [facility] - Preparing for Surgery Before surgery, you can play an important role.  Because skin is not sterile, your skin needs to be as free of germs as possible.  You can reduce the number of germs on your skin by washing with CHG (chlorahexidine gluconate) soap before surgery.  CHG is an antiseptic cleaner which kills germs and bonds with the skin to continue killing germs even after washing. Please DO NOT use if you have an allergy to CHG or antibacterial soaps.  If your skin becomes reddened/irritated stop using the CHG and inform your nurse when you arrive at Short Stay. Do not shave (including legs and underarms) for at least 48 hours prior to the first CHG shower.  You may shave your face/neck. Please follow these instructions carefully:  1.  Shower with CHG Soap the night before surgery and the  morning of Surgery.  2.  If you choose to wash your hair, wash your hair first as usual with your  normal  shampoo.  3.  After you shampoo, rinse your hair and body thoroughly to remove the  shampoo.                           4.  Use CHG as you would any other liquid soap.  You can apply chg directly  to the skin and wash                       Gently with a scrungie or clean  washcloth.  5.  Apply the CHG Soap to your body ONLY FROM THE NECK DOWN.   Do not use on face/ open                           Wound or open sores. Avoid contact with eyes, ears mouth and genitals (private parts).                       Wash face,  Genitals (private parts) with your normal soap.             6.  Wash thoroughly, paying special attention to the area where your surgery  will be performed.  7.  Thoroughly rinse your body with warm water from the neck down.  8.  DO NOT shower/wash with your normal soap after using and rinsing off  the CHG Soap.                9.  Pat yourself dry with a clean towel.            10.  Wear clean pajamas.            11.  Place clean sheets on your bed the night of your first shower and  do not  sleep with pets. Day of Surgery : Do not apply any lotions/deodorants the morning of surgery.  Please wear clean clothes to the hospital/surgery center.  FAILURE TO FOLLOW THESE INSTRUCTIONS MAY RESULT IN THE CANCELLATION OF YOUR SURGERY PATIENT SIGNATURE_________________________________  NURSE SIGNATURE__________________________________  ________________________________________________________________________

## 2014-02-23 NOTE — Progress Notes (Signed)
Please put orders in Epic surgery 03-01-14 pre op 02-24-14 Thanks

## 2014-02-23 NOTE — Progress Notes (Signed)
Need orders in EPIC.  Surgery on 03/01/14.  Preop on 02/24/14 at 0800am.  Thank You.

## 2014-02-24 ENCOUNTER — Encounter (HOSPITAL_COMMUNITY): Payer: Self-pay

## 2014-02-24 ENCOUNTER — Ambulatory Visit (HOSPITAL_COMMUNITY)
Admission: RE | Admit: 2014-02-24 | Discharge: 2014-02-24 | Disposition: A | Discharge status: Home / Self Care | Payer: No Typology Code available for payment source | Source: Ambulatory Visit | Attending: Anesthesiology | Admitting: Anesthesiology

## 2014-02-24 ENCOUNTER — Encounter (HOSPITAL_COMMUNITY)
Admission: RE | Admit: 2014-02-24 | Discharge: 2014-02-24 | Disposition: A | Discharge status: Home / Self Care | Payer: No Typology Code available for payment source | Source: Ambulatory Visit | Attending: Surgery | Admitting: Surgery

## 2014-02-24 DIAGNOSIS — Z01818 Encounter for other preprocedural examination: Secondary | ICD-10-CM | POA: Diagnosis present

## 2014-02-24 DIAGNOSIS — E669 Obesity, unspecified: Secondary | ICD-10-CM | POA: Diagnosis not present

## 2014-02-24 LAB — BASIC METABOLIC PANEL
ANION GAP: 17 — AB (ref 5–15)
BUN: 81 mg/dL — ABNORMAL HIGH (ref 6–23)
CO2: 20 meq/L (ref 19–32)
Calcium: 10.3 mg/dL (ref 8.4–10.5)
Chloride: 104 mEq/L (ref 96–112)
Creatinine, Ser: 2.56 mg/dL — ABNORMAL HIGH (ref 0.50–1.35)
GFR calc Af Amer: 30 mL/min — ABNORMAL LOW (ref >90)
GFR calc non Af Amer: 26 mL/min — ABNORMAL LOW (ref >90)
GLUCOSE: 159 mg/dL — AB (ref 70–99)
Potassium: 5 mEq/L (ref 3.7–5.3)
SODIUM: 141 meq/L (ref 137–147)

## 2014-02-24 LAB — CBC
HEMATOCRIT: 39.8 % (ref 39.0–52.0)
Hemoglobin: 13.5 g/dL (ref 13.0–17.0)
MCH: 31.6 pg (ref 26.0–34.0)
MCHC: 33.9 g/dL (ref 30.0–36.0)
MCV: 93.2 fL (ref 78.0–100.0)
Platelets: 217 10*3/uL (ref 150–400)
RBC: 4.27 MIL/uL (ref 4.22–5.81)
RDW: 13.4 % (ref 11.5–15.5)
WBC: 7 10*3/uL (ref 4.0–10.5)

## 2014-02-24 NOTE — Progress Notes (Signed)
02-24-14 1645 Labs viewable in [place], note BMP results.

## 2014-02-24 NOTE — Pre-Procedure Instructions (Addendum)
02-24-14 EKG 7'15 Epic. CXR done today. NO Preop MD order entry with visit-requested from Dr. Hassell Done, pt. To see 02-26-14. 02-24-14 note to Dr. Earlie Server office to note BMP results in Epic.

## 2014-02-25 ENCOUNTER — Ambulatory Visit (HOSPITAL_COMMUNITY)
Admission: RE | Admit: 2014-02-25 | Discharge: 2014-02-25 | Disposition: A | Discharge status: Home / Self Care | Payer: No Typology Code available for payment source | Source: Ambulatory Visit | Attending: Surgery | Admitting: Surgery

## 2014-02-25 ENCOUNTER — Other Ambulatory Visit (INDEPENDENT_AMBULATORY_CARE_PROVIDER_SITE_OTHER): Payer: Self-pay | Admitting: Surgery

## 2014-02-25 DIAGNOSIS — Z01812 Encounter for preprocedural laboratory examination: Secondary | ICD-10-CM | POA: Insufficient documentation

## 2014-02-25 LAB — DIFFERENTIAL
BASOS ABS: 0.1 10*3/uL (ref 0.0–0.1)
Basophils Relative: 1 % (ref 0–1)
EOS ABS: 0.4 10*3/uL (ref 0.0–0.7)
EOS PCT: 5 % (ref 0–5)
LYMPHS PCT: 33 % (ref 12–46)
Lymphs Abs: 2.3 10*3/uL (ref 0.7–4.0)
Monocytes Absolute: 0.7 10*3/uL (ref 0.1–1.0)
Monocytes Relative: 9 % (ref 3–12)
Neutro Abs: 3.7 10*3/uL (ref 1.7–7.7)
Neutrophils Relative %: 52 % (ref 43–77)

## 2014-02-25 LAB — HEPATIC FUNCTION PANEL
ALK PHOS: 81 U/L (ref 39–117)
ALT: 30 U/L (ref 0–53)
AST: 39 U/L — ABNORMAL HIGH (ref 0–37)
Albumin: 3.9 g/dL (ref 3.5–5.2)
BILIRUBIN TOTAL: 0.6 mg/dL (ref 0.3–1.2)
Bilirubin, Direct: <0.2 mg/dL (ref 0.0–0.3)
Total Protein: 7.8 g/dL (ref 6.0–8.3)

## 2014-02-25 NOTE — H&P (Signed)
Chief Complaint: Morbid obesity with multiple comorbidities including type 2 diabetes and hypertension  History of Present Illness: Luis Joseph. 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 Damita Dunnings at [facility]. He currently works part-time at [employer]. After researching this extensively and after attending a seminar he was interested in a Roux-en-Y gastric bypass. He had seen his urologist, Dr. Tresa Moore who stated that because of the chronic problems he's had with his right kidney that he might not recommend a Roux-en-Y gastric bypass. I'll discuss this with Dr. Tresa Moore is a not certain that that should be an absolute contraindication. He does have problems with recurrent stones on the right side and currently has a stent in place. He had procedures for stones about 30 years ago and was recently found to have some large stones in his right calyx.  He has had type 2 diabetes for about 2 years. We'll go ahead and begin the workup for laparoscopic Roux-en-Y gastric bypass although we have discussed sleeve and the bypass.  Past Medical History   Diagnosis  Date   .  Hyperlipidemia  05/21/1988   .  Hypertension  05/21/1978   .  Gout  05/21/1985   .  GERD (gastroesophageal reflux disease)  05/21/1978   .  Migraines      Better after retirement   .  Diabetes mellitus, type 2    .  History of kidney stones    .  Hyperparathyroidism, secondary renal    .  Anemia in chronic kidney disease    .  Allergic rhinitis    .  Hydronephrosis, right    .  Renal cyst, left    .  CKD (chronic kidney disease), stage III      NEPHROLOGIST- DR Lavonia Dana    Past Surgical History   Procedure  Laterality  Date   .  Partial nephrectomy   12/19/1977     Due to cyst/stone removal   .  Cystoscopy w/ ureteral stent placement  Right  11/25/2012     Procedure: CYSTOSCOPY WITH RETROGRADE PYELOGRAM/URETERAL STENT PLACEMENT; Surgeon: Hanley Ben, MD;  Location: [place]; Service: Urology; Laterality: Right;    Current Outpatient Prescriptions   Medication  Sig  Dispense  Refill   .  allopurinol (ZYLOPRIM) 100 MG tablet  Take one tablet by mouth one time daily  90 tablet  3   .  allopurinol (ZYLOPRIM) 300 MG tablet  Take 1 tablet (300 mg total) by mouth daily.  90 tablet  3   .  Cinnamon 500 MG capsule  Take 500 mg by mouth 2 (two) times daily.     .  cloNIDine (CATAPRES) 0.1 MG tablet  Take 2.5 tablets (0.25 mg total) by mouth 2 (two) times daily.     .  Coenzyme Q10 (COQ-10 PO)  Take 300 mg by mouth daily.     .  colchicine 0.6 MG tablet      .  desonide (DESOWEN) 0.05 % lotion  Apply topically 2 (two) times daily as needed. Do not use on the face.  59 mL  2   .  fexofenadine (ALLEGRA) 180 MG tablet  Take 180 mg by mouth daily.     .  magnesium gluconate (MAGONATE) 500 MG tablet  Take 500 mg by mouth daily.     .  metFORMIN (GLUCOPHAGE) 500 MG tablet  Take 500 mg by  mouth 2 (two) times daily with a meal.     .  metoprolol succinate (TOPROL-XL) 100 MG 24 hr tablet  Take 100 mg by mouth every evening. Take with or immediately following a meal.     .  Multiple Vitamin (MULTIVITAMIN) tablet  Take 1 tablet by mouth daily.     .  niacin 500 MG tablet  Take 500 mg by mouth 2 (two) times daily with a meal.     .  SUMAtriptan-naproxen (TREXIMET) 85-500 MG per tablet  Take 1 tablet by mouth every 2 (two) hours as needed. TAKE ONE TABLET BY MOUTH AT ONSET OF HEADACHE, MAY REPEAT ONCE IN 2 HOURS IF NEEDED     .  zinc gluconate 50 MG tablet  Take 50 mg by mouth daily.      No current facility-administered medications for this visit.   Codeine sulfate and Sildenafil citrate  Family History   Problem  Relation  Age of Onset   .  Heart disease  Mother    .  Diabetes  Mother    .  Hypertension  Mother    .  Cancer  Father      Lung   .  Diabetes  Sister    .  Heart disease  Maternal Grandfather    .  Diabetes  Maternal Grandfather     .  Stroke  Neg Hx    .  Prostate cancer  Neg Hx    .  Colon cancer  Neg Hx    Social History: reports that he quit smoking about 35 years ago. He quit smokeless tobacco use about 35 years ago. He reports that he does not drink alcohol or use illicit drugs.  REVIEW OF SYSTEMS - PERTINENT POSITIVES ONLY:  Positive for migraines although he has not had one recently. No history of heart disease chest pain or breathing problems. Denies history of reflux. Negative history of diarrhea. No history of DVT. Review of systems otherwise negative.  Physical Exam:  Blood pressure 152/78, pulse 68, temperature 97.3 F (36.3 C), temperature source Oral, resp. rate 20, height 5\' 11"  (1.803 m), weight 273 lb 9.6 oz (124.104 kg).  Body mass index is 38.18 kg/(m^2).  Gen: WDWN white male NAD  Neurological: Alert and oriented to person, place, and time. Motor and sensory function is grossly intact  Head: Normocephalic and atraumatic.  Eyes: Conjunctivae are normal. Pupils are equal, round, and reactive to light. No scleral icterus.  Neck: Normal range of motion. Neck supple. No tracheal deviation or thyromegaly present.  Cardiovascular: SR without murmurs or gallops. No carotid bruits  Respiratory: Effort normal. No respiratory distress. No chest wall tenderness. Breath sounds normal. No wheezes, rales or rhonchi.  Abdomen: Mildly obese nontender no rebound guarding. No prior upper abdominal surgery. He has had a right flank incision from his kidney surgery.  GU:  Musculoskeletal: Normal range of motion. Extremities are nontender. No cyanosis, edema or clubbing noted Lymphadenopathy: No cervical, preauricular, postauricular or axillary adenopathy is present Skin: Skin is warm and dry. No rash noted. No diaphoresis. No erythema. No pallor. Pscyh: Normal mood and affect. Behavior is normal. Judgment and thought content normal.  LABORATORY RESULTS:  No results found for this or any previous visit (from the past  48 hour(s)).  RADIOLOGY RESULTS:  No results found.  Problem List:  Patient Active Problem List    Diagnosis  Date Noted   .  Routine general medical examination at a health care  facility  09/29/2012   .  Elevated serum creatinine  09/29/2012   .  Migraine, unspecified, without mention of intractable migraine without mention of status migrainosus  08/04/2012   .  Erectile dysfunction  07/25/2011   .  Postnasal drip  07/25/2011   .  Muscle cramp, nocturnal  07/25/2011   .  Vertigo  07/25/2011   .  FATIGUE  06/06/2010   .  Diabetes type 2, uncontrolled  03/17/2010   .  SKIN LESION  03/17/2010   .  DERMATITIS  10/20/2009   .  ANXIETY  06/30/2008   .  UNSPECIFIED SLEEP DISTURBANCE  06/10/2008   .  OBESITY, UNSPECIFIED  04/21/2007   .  COUGH  08/27/2006   .  HYPERLIPIDEMIA  05/21/1988   .  GOUT  05/21/1985   .  HYPERTENSION  05/21/1978   .  GERD  05/21/1978   .  HEADACHE, CLUSTER  05/21/1976   Assessment & Plan:  BMI of 38 with diabetes and hypertension. Will begin down the workup for Roux-en-Y gastric bypass. We'll consider sleeve gastrectomy as an alternative. I will permit him for this.   Matt B. Hassell Done, MD, [facility]  [phone] beeper  [phone]

## 2014-02-26 ENCOUNTER — Telehealth: Payer: Self-pay

## 2014-02-26 ENCOUNTER — Ambulatory Visit (INDEPENDENT_AMBULATORY_CARE_PROVIDER_SITE_OTHER): Payer: No Typology Code available for payment source | Admitting: Surgery

## 2014-02-26 ENCOUNTER — Other Ambulatory Visit (INDEPENDENT_AMBULATORY_CARE_PROVIDER_SITE_OTHER): Payer: Self-pay

## 2014-02-26 NOTE — Telephone Encounter (Signed)
Noted, thanks!

## 2014-02-26 NOTE — Telephone Encounter (Signed)
Pt returned [employer] LPN call; pt states the surgeon's office stopped ASA one week prior to surgery and pt will get instructions after surgery on 03/01/14 when to restart ASA. Pt said he has understanding of instructions from mychart note about taking insulin. This morning FBS was 134 and pt took 23 units of insulin at hs on 02/25/14.

## 2014-02-28 NOTE — H&P (Signed)
Chief Complaint: Morbid obesity with multiple comorbidities including type 2 diabetes and hypertension  History of Present Illness: Luis Joseph. 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 Damita Dunnings at [facility]. He currently works part-time at [employer]. After researching this extensively and after attending a seminar he was interested in a Roux-en-Y gastric bypass. He had seen his urologist, Dr. Tresa Moore who stated that because of the chronic problems he's had with his right kidney that he might not recommend a Roux-en-Y gastric bypass. I'll discuss this with Dr. Tresa Moore is a not certain that that should be an absolute contraindication. He does have problems with recurrent stones on the right side and currently has a stent in place. He had procedures for stones about 30 years ago and was recently found to have some large stones in his right calyx.  He has had type 2 diabetes for about 2 years. We'll go ahead and begin the workup for laparoscopic Roux-en-Y gastric bypass although we have discussed sleeve and the bypass.  Past Medical History   Diagnosis  Date   .  Hyperlipidemia  05/21/1988   .  Hypertension  05/21/1978   .  Gout  05/21/1985   .  GERD (gastroesophageal reflux disease)  05/21/1978   .  Migraines      Better after retirement   .  Diabetes mellitus, type 2    .  History of kidney stones    .  Hyperparathyroidism, secondary renal    .  Anemia in chronic kidney disease    .  Allergic rhinitis    .  Hydronephrosis, right    .  Renal cyst, left    .  CKD (chronic kidney disease), stage III      NEPHROLOGIST- DR Lavonia Dana    Past Surgical History   Procedure  Laterality  Date   .  Partial nephrectomy   12/19/1977     Due to cyst/stone removal   .  Cystoscopy w/ ureteral stent placement  Right  11/25/2012     Procedure: CYSTOSCOPY WITH RETROGRADE PYELOGRAM/URETERAL STENT PLACEMENT; Surgeon: Hanley Ben, MD;  Location: [place]; Service: Urology; Laterality: Right;    Current Outpatient Prescriptions   Medication  Sig  Dispense  Refill   .  allopurinol (ZYLOPRIM) 100 MG tablet  Take one tablet by mouth one time daily  90 tablet  3   .  allopurinol (ZYLOPRIM) 300 MG tablet  Take 1 tablet (300 mg total) by mouth daily.  90 tablet  3   .  Cinnamon 500 MG capsule  Take 500 mg by mouth 2 (two) times daily.     .  cloNIDine (CATAPRES) 0.1 MG tablet  Take 2.5 tablets (0.25 mg total) by mouth 2 (two) times daily.     .  Coenzyme Q10 (COQ-10 PO)  Take 300 mg by mouth daily.     .  colchicine 0.6 MG tablet      .  desonide (DESOWEN) 0.05 % lotion  Apply topically 2 (two) times daily as needed. Do not use on the face.  59 mL  2   .  fexofenadine (ALLEGRA) 180 MG tablet  Take 180 mg by mouth daily.     .  magnesium gluconate (MAGONATE) 500 MG tablet  Take 500 mg by mouth daily.     .  metFORMIN (GLUCOPHAGE) 500 MG tablet  Take 500 mg by  mouth 2 (two) times daily with a meal.     .  metoprolol succinate (TOPROL-XL) 100 MG 24 hr tablet  Take 100 mg by mouth every evening. Take with or immediately following a meal.     .  Multiple Vitamin (MULTIVITAMIN) tablet  Take 1 tablet by mouth daily.     .  niacin 500 MG tablet  Take 500 mg by mouth 2 (two) times daily with a meal.     .  SUMAtriptan-naproxen (TREXIMET) 85-500 MG per tablet  Take 1 tablet by mouth every 2 (two) hours as needed. TAKE ONE TABLET BY MOUTH AT ONSET OF HEADACHE, MAY REPEAT ONCE IN 2 HOURS IF NEEDED     .  zinc gluconate 50 MG tablet  Take 50 mg by mouth daily.      No current facility-administered medications for this visit.   Codeine sulfate and Sildenafil citrate  Family History   Problem  Relation  Age of Onset   .  Heart disease  Mother    .  Diabetes  Mother    .  Hypertension  Mother    .  Cancer  Father      Lung   .  Diabetes  Sister    .  Heart disease  Maternal Grandfather    .  Diabetes  Maternal Grandfather     .  Stroke  Neg Hx    .  Prostate cancer  Neg Hx    .  Colon cancer  Neg Hx    Social History: reports that he quit smoking about 35 years ago. He quit smokeless tobacco use about 35 years ago. He reports that he does not drink alcohol or use illicit drugs.  REVIEW OF SYSTEMS - PERTINENT POSITIVES ONLY:  Positive for migraines although he has not had one recently. No history of heart disease chest pain or breathing problems. Denies history of reflux. Negative history of diarrhea. No history of DVT. Review of systems otherwise negative.  Physical Exam:  Blood pressure 152/78, pulse 68, temperature 97.3 F (36.3 C), temperature source Oral, resp. rate 20, height 5\' 11"  (1.803 m), weight 273 lb 9.6 oz (124.104 kg).  Body mass index is 38.18 kg/(m^2).  Gen: WDWN white male NAD  Neurological: Alert and oriented to person, place, and time. Motor and sensory function is grossly intact  Head: Normocephalic and atraumatic.  Eyes: Conjunctivae are normal. Pupils are equal, round, and reactive to light. No scleral icterus.  Neck: Normal range of motion. Neck supple. No tracheal deviation or thyromegaly present.  Cardiovascular: SR without murmurs or gallops. No carotid bruits  Respiratory: Effort normal. No respiratory distress. No chest wall tenderness. Breath sounds normal. No wheezes, rales or rhonchi.  Abdomen: Mildly obese nontender no rebound guarding. No prior upper abdominal surgery. He has had a right flank incision from his kidney surgery.  GU:  Musculoskeletal: Normal range of motion. Extremities are nontender. No cyanosis, edema or clubbing noted Lymphadenopathy: No cervical, preauricular, postauricular or axillary adenopathy is present Skin: Skin is warm and dry. No rash noted. No diaphoresis. No erythema. No pallor. Pscyh: Normal mood and affect. Behavior is normal. Judgment and thought content normal.  LABORATORY RESULTS:  No results found for this or any previous visit (from the past  48 hour(s)).  RADIOLOGY RESULTS:  No results found.  Problem List:  Patient Active Problem List    Diagnosis  Date Noted   .  Routine general medical examination at a health care  facility  09/29/2012   .  Elevated serum creatinine  09/29/2012   .  Migraine, unspecified, without mention of intractable migraine without mention of status migrainosus  08/04/2012   .  Erectile dysfunction  07/25/2011   .  Postnasal drip  07/25/2011   .  Muscle cramp, nocturnal  07/25/2011   .  Vertigo  07/25/2011   .  FATIGUE  06/06/2010   .  Diabetes type 2, uncontrolled  03/17/2010   .  SKIN LESION  03/17/2010   .  DERMATITIS  10/20/2009   .  ANXIETY  06/30/2008   .  UNSPECIFIED SLEEP DISTURBANCE  06/10/2008   .  OBESITY, UNSPECIFIED  04/21/2007   .  COUGH  08/27/2006   .  HYPERLIPIDEMIA  05/21/1988   .  GOUT  05/21/1985   .  HYPERTENSION  05/21/1978   .  GERD  05/21/1978   .  HEADACHE, CLUSTER  05/21/1976   Assessment & Plan:  BMI of 38 with diabetes and hypertension. Will begin down the workup for Roux-en-Y gastric bypass. We'll consider sleeve gastrectomy as an alternative.  Matt B. Hassell Done, MD, [facility]  [phone] beeper  [phone]

## 2014-03-01 ENCOUNTER — Encounter (HOSPITAL_COMMUNITY): Payer: Self-pay | Admitting: *Deleted

## 2014-03-01 ENCOUNTER — Inpatient Hospital Stay (HOSPITAL_COMMUNITY)
Admission: RE | Admit: 2014-03-01 | Discharge: 2014-03-04 | DRG: 621 | Disposition: A | Discharge status: Home / Self Care | Payer: No Typology Code available for payment source | Source: Ambulatory Visit | Attending: Surgery | Admitting: Surgery

## 2014-03-01 ENCOUNTER — Encounter (HOSPITAL_COMMUNITY)
Admission: RE | Disposition: A | Discharge status: Home / Self Care | Payer: Self-pay | Source: Ambulatory Visit | Attending: Surgery

## 2014-03-01 ENCOUNTER — Inpatient Hospital Stay (HOSPITAL_COMMUNITY): Payer: No Typology Code available for payment source | Admitting: Anesthesiology

## 2014-03-01 ENCOUNTER — Encounter (HOSPITAL_COMMUNITY): Payer: No Typology Code available for payment source | Admitting: Anesthesiology

## 2014-03-01 DIAGNOSIS — Z905 Acquired absence of kidney: Secondary | ICD-10-CM | POA: Diagnosis present

## 2014-03-01 DIAGNOSIS — E785 Hyperlipidemia, unspecified: Secondary | ICD-10-CM | POA: Diagnosis present

## 2014-03-01 DIAGNOSIS — I129 Hypertensive chronic kidney disease with stage 1 through stage 4 chronic kidney disease, or unspecified chronic kidney disease: Secondary | ICD-10-CM | POA: Diagnosis present

## 2014-03-01 DIAGNOSIS — N183 Chronic kidney disease, stage 3 (moderate): Secondary | ICD-10-CM | POA: Diagnosis present

## 2014-03-01 DIAGNOSIS — Z6838 Body mass index (BMI) 38.0-38.9, adult: Secondary | ICD-10-CM

## 2014-03-01 DIAGNOSIS — Z79899 Other long term (current) drug therapy: Secondary | ICD-10-CM

## 2014-03-01 DIAGNOSIS — Z833 Family history of diabetes mellitus: Secondary | ICD-10-CM

## 2014-03-01 DIAGNOSIS — Z87442 Personal history of urinary calculi: Secondary | ICD-10-CM | POA: Diagnosis not present

## 2014-03-01 DIAGNOSIS — E119 Type 2 diabetes mellitus without complications: Secondary | ICD-10-CM | POA: Diagnosis present

## 2014-03-01 DIAGNOSIS — K219 Gastro-esophageal reflux disease without esophagitis: Secondary | ICD-10-CM | POA: Diagnosis present

## 2014-03-01 DIAGNOSIS — R51 Headache: Secondary | ICD-10-CM | POA: Diagnosis present

## 2014-03-01 DIAGNOSIS — Z87891 Personal history of nicotine dependence: Secondary | ICD-10-CM | POA: Diagnosis not present

## 2014-03-01 DIAGNOSIS — Z9884 Bariatric surgery status: Secondary | ICD-10-CM

## 2014-03-01 HISTORY — PX: GASTRIC ROUX-EN-Y: SHX5262

## 2014-03-01 LAB — GLUCOSE, CAPILLARY
GLUCOSE-CAPILLARY: 213 mg/dL — AB (ref 70–99)
Glucose-Capillary: 115 mg/dL — ABNORMAL HIGH (ref 70–99)
Glucose-Capillary: 185 mg/dL — ABNORMAL HIGH (ref 70–99)
Glucose-Capillary: 225 mg/dL — ABNORMAL HIGH (ref 70–99)
Glucose-Capillary: 235 mg/dL — ABNORMAL HIGH (ref 70–99)
Glucose-Capillary: 241 mg/dL — ABNORMAL HIGH (ref 70–99)

## 2014-03-01 LAB — CREATININE, SERUM
Creatinine, Ser: 2.11 mg/dL — ABNORMAL HIGH (ref 0.50–1.35)
GFR calc Af Amer: 38 mL/min — ABNORMAL LOW (ref >90)
GFR calc non Af Amer: 32 mL/min — ABNORMAL LOW (ref >90)

## 2014-03-01 LAB — CBC
HCT: 38.7 % — ABNORMAL LOW (ref 39.0–52.0)
Hemoglobin: 13.1 g/dL (ref 13.0–17.0)
MCH: 32.6 pg (ref 26.0–34.0)
MCHC: 33.9 g/dL (ref 30.0–36.0)
MCV: 96.3 fL (ref 78.0–100.0)
Platelets: 191 10*3/uL (ref 150–400)
RBC: 4.02 MIL/uL — ABNORMAL LOW (ref 4.22–5.81)
RDW: 13.6 % (ref 11.5–15.5)
WBC: 9.8 10*3/uL (ref 4.0–10.5)

## 2014-03-01 LAB — POCT I-STAT 4, (NA,K, GLUC, HGB,HCT)
Glucose, Bld: 113 mg/dL — ABNORMAL HIGH (ref 70–99)
HCT: 41 % (ref 39.0–52.0)
Hemoglobin: 13.9 g/dL (ref 13.0–17.0)
Potassium: 4.5 mEq/L (ref 3.7–5.3)
Sodium: 141 mEq/L (ref 137–147)

## 2014-03-01 SURGERY — LAPAROSCOPIC ROUX-EN-Y GASTRIC BYPASS WITH UPPER ENDOSCOPY
Anesthesia: General | Site: Abdomen

## 2014-03-01 MED ORDER — 0.9 % SODIUM CHLORIDE (POUR BTL) OPTIME
TOPICAL | Status: DC | PRN
  Administered 2014-03-01: 1000 mL

## 2014-03-01 MED ORDER — DEXAMETHASONE SODIUM PHOSPHATE 10 MG/ML IJ SOLN
INTRAMUSCULAR | Status: AC
  Filled 2014-03-01: qty 1

## 2014-03-01 MED ORDER — CISATRACURIUM BESYLATE (PF) 10 MG/5ML IV SOLN
INTRAVENOUS | Status: DC | PRN
  Administered 2014-03-01: 10 mg via INTRAVENOUS
  Administered 2014-03-01: 6 mg via INTRAVENOUS

## 2014-03-01 MED ORDER — MIDAZOLAM HCL 5 MG/5ML IJ SOLN
INTRAMUSCULAR | Status: DC | PRN
  Administered 2014-03-01: 2 mg via INTRAVENOUS

## 2014-03-01 MED ORDER — NEOSTIGMINE METHYLSULFATE 10 MG/10ML IV SOLN
INTRAVENOUS | Status: DC | PRN
  Administered 2014-03-01: 4 mg via INTRAVENOUS

## 2014-03-01 MED ORDER — UNJURY CHOCOLATE CLASSIC POWDER
2.0000 [oz_av] | Freq: Four times a day (QID) | ORAL | Status: DC
  Administered 2014-03-03: 2 [oz_av] via ORAL

## 2014-03-01 MED ORDER — ONDANSETRON HCL 4 MG/2ML IJ SOLN
INTRAMUSCULAR | Status: DC | PRN
  Administered 2014-03-01: 4 mg via INTRAVENOUS

## 2014-03-01 MED ORDER — GLYCOPYRROLATE 0.2 MG/ML IJ SOLN
INTRAMUSCULAR | Status: AC
  Filled 2014-03-01: qty 2

## 2014-03-01 MED ORDER — HEPARIN SODIUM (PORCINE) 5000 UNIT/ML IJ SOLN
5000.0000 [IU] | Freq: Three times a day (TID) | INTRAMUSCULAR | Status: DC
  Administered 2014-03-01 – 2014-03-04 (×8): 5000 [IU] via SUBCUTANEOUS
  Filled 2014-03-01 (×12): qty 1

## 2014-03-01 MED ORDER — UNJURY VANILLA POWDER
2.0000 [oz_av] | Freq: Four times a day (QID) | ORAL | Status: DC
  Administered 2014-03-03: 2 [oz_av] via ORAL

## 2014-03-01 MED ORDER — HYDROMORPHONE HCL 1 MG/ML IJ SOLN
INTRAMUSCULAR | Status: AC
  Filled 2014-03-01: qty 1

## 2014-03-01 MED ORDER — HYDROMORPHONE HCL 1 MG/ML IJ SOLN
0.2500 mg | INTRAMUSCULAR | Status: DC | PRN
  Administered 2014-03-01 (×3): 0.5 mg via INTRAVENOUS

## 2014-03-01 MED ORDER — EPHEDRINE SULFATE 50 MG/ML IJ SOLN
INTRAMUSCULAR | Status: AC
  Filled 2014-03-01: qty 1

## 2014-03-01 MED ORDER — KCL IN DEXTROSE-NACL 20-5-0.45 MEQ/L-%-% IV SOLN
INTRAVENOUS | Status: DC
  Administered 2014-03-01 – 2014-03-04 (×7): via INTRAVENOUS
  Filled 2014-03-01 (×8): qty 1000

## 2014-03-01 MED ORDER — GLYCOPYRROLATE 0.2 MG/ML IJ SOLN
INTRAMUSCULAR | Status: DC | PRN
  Administered 2014-03-01: 0.4 mg via INTRAVENOUS

## 2014-03-01 MED ORDER — TISSEEL VH 10 ML EX KIT
PACK | CUTANEOUS | Status: AC
  Filled 2014-03-01: qty 2

## 2014-03-01 MED ORDER — SUCCINYLCHOLINE CHLORIDE 20 MG/ML IJ SOLN
INTRAMUSCULAR | Status: DC | PRN
  Administered 2014-03-01: 100 mg via INTRAVENOUS

## 2014-03-01 MED ORDER — CHLORHEXIDINE GLUCONATE 0.12 % MT SOLN
15.0000 mL | Freq: Two times a day (BID) | OROMUCOSAL | Status: DC
  Administered 2014-03-01 – 2014-03-03 (×5): 15 mL via OROMUCOSAL
  Filled 2014-03-01 (×9): qty 15

## 2014-03-01 MED ORDER — LACTATED RINGERS IV SOLN
INTRAVENOUS | Status: DC | PRN
  Administered 2014-03-01 (×3): via INTRAVENOUS

## 2014-03-01 MED ORDER — MORPHINE SULFATE 2 MG/ML IJ SOLN
2.0000 mg | INTRAMUSCULAR | Status: DC | PRN
  Administered 2014-03-01 (×2): 2 mg via INTRAVENOUS
  Filled 2014-03-01 (×3): qty 1

## 2014-03-01 MED ORDER — FENTANYL CITRATE 0.05 MG/ML IJ SOLN
INTRAMUSCULAR | Status: DC | PRN
  Administered 2014-03-01: 100 ug via INTRAVENOUS
  Administered 2014-03-01 (×2): 150 ug via INTRAVENOUS
  Administered 2014-03-01: 100 ug via INTRAVENOUS

## 2014-03-01 MED ORDER — ONDANSETRON HCL 4 MG/2ML IJ SOLN
INTRAMUSCULAR | Status: AC
  Filled 2014-03-01: qty 2

## 2014-03-01 MED ORDER — DEXAMETHASONE SODIUM PHOSPHATE 10 MG/ML IJ SOLN
INTRAMUSCULAR | Status: DC | PRN
  Administered 2014-03-01: 10 mg via INTRAVENOUS

## 2014-03-01 MED ORDER — PROPOFOL 10 MG/ML IV BOLUS
INTRAVENOUS | Status: AC
  Filled 2014-03-01: qty 20

## 2014-03-01 MED ORDER — CEFOXITIN SODIUM 2 G IV SOLR
INTRAVENOUS | Status: AC
  Filled 2014-03-01: qty 2

## 2014-03-01 MED ORDER — METOPROLOL TARTRATE 1 MG/ML IV SOLN
10.0000 mg | Freq: Four times a day (QID) | INTRAVENOUS | Status: DC
  Administered 2014-03-01 – 2014-03-03 (×5): 10 mg via INTRAVENOUS
  Filled 2014-03-01 (×10): qty 10

## 2014-03-01 MED ORDER — ACETAMINOPHEN 160 MG/5ML PO SOLN
325.0000 mg | ORAL | Status: DC | PRN
  Filled 2014-03-01: qty 20.3

## 2014-03-01 MED ORDER — NEOSTIGMINE METHYLSULFATE 10 MG/10ML IV SOLN
INTRAVENOUS | Status: AC
  Filled 2014-03-01: qty 1

## 2014-03-01 MED ORDER — TISSEEL VH 10 ML EX KIT
PACK | CUTANEOUS | Status: DC | PRN
  Administered 2014-03-01: 20 mL

## 2014-03-01 MED ORDER — HEPARIN SODIUM (PORCINE) 5000 UNIT/ML IJ SOLN
5000.0000 [IU] | INTRAMUSCULAR | Status: AC
  Administered 2014-03-01: 5000 [IU] via SUBCUTANEOUS
  Filled 2014-03-01: qty 1

## 2014-03-01 MED ORDER — LIDOCAINE HCL 1 % IJ SOLN
INTRAMUSCULAR | Status: DC | PRN
  Administered 2014-03-01: 80 mg via INTRADERMAL

## 2014-03-01 MED ORDER — UNJURY CHICKEN SOUP POWDER
2.0000 [oz_av] | Freq: Four times a day (QID) | ORAL | Status: DC
  Administered 2014-03-04: 2 [oz_av] via ORAL

## 2014-03-01 MED ORDER — CHLORHEXIDINE GLUCONATE CLOTH 2 % EX PADS: 6.0000 | MEDICATED_PAD | Freq: Once | CUTANEOUS | Status: DC

## 2014-03-01 MED ORDER — CLONIDINE HCL 0.2 MG/24HR TD PTWK
0.2000 mg | MEDICATED_PATCH | TRANSDERMAL | Status: DC
  Administered 2014-03-01: 0.2 mg via TRANSDERMAL
  Filled 2014-03-01: qty 1

## 2014-03-01 MED ORDER — MIDAZOLAM HCL 2 MG/2ML IJ SOLN
INTRAMUSCULAR | Status: AC
  Filled 2014-03-01: qty 2

## 2014-03-01 MED ORDER — PROMETHAZINE HCL 25 MG/ML IJ SOLN: 6.2500 mg | INTRAMUSCULAR | Status: DC | PRN

## 2014-03-01 MED ORDER — HYDRALAZINE HCL 20 MG/ML IJ SOLN
10.0000 mg | INTRAMUSCULAR | Status: DC | PRN
  Administered 2014-03-03 (×3): 10 mg via INTRAVENOUS
  Filled 2014-03-01 (×4): qty 1

## 2014-03-01 MED ORDER — CEFOXITIN SODIUM 2 G IV SOLR
2.0000 g | INTRAVENOUS | Status: AC
  Administered 2014-03-01: 2 g via INTRAVENOUS

## 2014-03-01 MED ORDER — OXYCODONE HCL 5 MG/5ML PO SOLN
5.0000 mg | ORAL | Status: DC | PRN
  Administered 2014-03-03: 5 mg via ORAL
  Administered 2014-03-03: 10 mg via ORAL
  Filled 2014-03-01: qty 25
  Filled 2014-03-01 (×3): qty 10

## 2014-03-01 MED ORDER — FENTANYL CITRATE 0.05 MG/ML IJ SOLN
INTRAMUSCULAR | Status: AC
  Filled 2014-03-01: qty 5

## 2014-03-01 MED ORDER — EPHEDRINE SULFATE 50 MG/ML IJ SOLN
INTRAMUSCULAR | Status: DC | PRN
  Administered 2014-03-01 (×2): 10 mg via INTRAVENOUS
  Administered 2014-03-01: 5 mg via INTRAVENOUS

## 2014-03-01 MED ORDER — CETYLPYRIDINIUM CHLORIDE 0.05 % MT LIQD
7.0000 mL | Freq: Two times a day (BID) | OROMUCOSAL | Status: DC
  Administered 2014-03-02 – 2014-03-04 (×5): 7 mL via OROMUCOSAL

## 2014-03-01 MED ORDER — PROPOFOL 10 MG/ML IV BOLUS
INTRAVENOUS | Status: DC | PRN
  Administered 2014-03-01: 200 mg via INTRAVENOUS

## 2014-03-01 MED ORDER — HYDROMORPHONE HCL 1 MG/ML IJ SOLN
1.0000 mg | INTRAMUSCULAR | Status: DC | PRN
  Administered 2014-03-01 – 2014-03-04 (×15): 1 mg via INTRAVENOUS
  Filled 2014-03-01 (×15): qty 1

## 2014-03-01 MED ORDER — BUPIVACAINE LIPOSOME 1.3 % IJ SUSP
20.0000 mL | Freq: Once | INTRAMUSCULAR | Status: AC
  Administered 2014-03-01: 20 mL
  Filled 2014-03-01: qty 20

## 2014-03-01 MED ORDER — INSULIN ASPART 100 UNIT/ML ~~LOC~~ SOLN
0.0000 [IU] | SUBCUTANEOUS | Status: DC
  Administered 2014-03-01 (×3): 7 [IU] via SUBCUTANEOUS
  Administered 2014-03-02 (×4): 4 [IU] via SUBCUTANEOUS
  Administered 2014-03-02: 7 [IU] via SUBCUTANEOUS
  Administered 2014-03-02 – 2014-03-03 (×4): 4 [IU] via SUBCUTANEOUS
  Administered 2014-03-03: 7 [IU] via SUBCUTANEOUS
  Administered 2014-03-03 – 2014-03-04 (×6): 4 [IU] via SUBCUTANEOUS

## 2014-03-01 MED ORDER — LACTATED RINGERS IR SOLN
Status: DC | PRN
  Administered 2014-03-01: 3000 mL

## 2014-03-01 MED ORDER — STERILE WATER FOR INJECTION IJ SOLN
INTRAMUSCULAR | Status: AC
  Filled 2014-03-01: qty 10

## 2014-03-01 MED ORDER — ONDANSETRON HCL 4 MG/2ML IJ SOLN
4.0000 mg | INTRAMUSCULAR | Status: DC | PRN
  Administered 2014-03-02 – 2014-03-04 (×9): 4 mg via INTRAVENOUS
  Filled 2014-03-01 (×9): qty 2

## 2014-03-01 MED ORDER — LIDOCAINE HCL (CARDIAC) 20 MG/ML IV SOLN
INTRAVENOUS | Status: AC
  Filled 2014-03-01: qty 5

## 2014-03-01 MED ORDER — LACTATED RINGERS IV SOLN
INTRAVENOUS | Status: DC
  Administered 2014-03-01: 11:00:00 via INTRAVENOUS

## 2014-03-01 MED ORDER — ACETAMINOPHEN 160 MG/5ML PO SOLN
650.0000 mg | ORAL | Status: DC | PRN
  Administered 2014-03-02: 650 mg via ORAL
  Filled 2014-03-01: qty 20.3

## 2014-03-01 SURGICAL SUPPLY — 77 items
ADH SKN CLS APL DERMABOND .7 (GAUZE/BANDAGES/DRESSINGS) ×2
APL SKNCLS STERI-STRIP NONHPOA (GAUZE/BANDAGES/DRESSINGS)
APL SRG 32X5 SNPLK LF DISP (MISCELLANEOUS) ×1
APPLICATOR COTTON TIP 6IN STRL (MISCELLANEOUS) ×6 IMPLANT
BENZOIN TINCTURE PRP APPL 2/3 (GAUZE/BANDAGES/DRESSINGS) IMPLANT
BLADE SURG 15 STRL LF DISP TIS (BLADE) ×1 IMPLANT
BLADE SURG 15 STRL SS (BLADE) ×3
CABLE HIGH FREQUENCY MONO STRZ (ELECTRODE) IMPLANT
CANISTER SUCTION 2500CC (MISCELLANEOUS) ×3 IMPLANT
CLIP SUT LAPRA TY ABSORB (SUTURE) ×3 IMPLANT
CLOSURE WOUND 1/2 X4 (GAUZE/BANDAGES/DRESSINGS)
DERMABOND ADVANCED (GAUZE/BANDAGES/DRESSINGS) ×4
DERMABOND ADVANCED .7 DNX12 (GAUZE/BANDAGES/DRESSINGS) IMPLANT
DEVICE SUT QUICK LOAD TK 5 (STAPLE) IMPLANT
DEVICE SUT TI-KNOT TK 5X26 (MISCELLANEOUS) IMPLANT
DEVICE SUTURE ENDOST 10MM (ENDOMECHANICALS) ×3 IMPLANT
DEVICE TI KNOT TK5 (MISCELLANEOUS)
DISSECTOR BLUNT TIP ENDO 5MM (MISCELLANEOUS) IMPLANT
DRAIN PENROSE 18X1/4 LTX STRL (WOUND CARE) ×3 IMPLANT
DRAPE CAMERA CLOSED 9X96 (DRAPES) ×3 IMPLANT
GAUZE SPONGE 4X4 12PLY STRL (GAUZE/BANDAGES/DRESSINGS) ×3 IMPLANT
GAUZE SPONGE 4X4 16PLY XRAY LF (GAUZE/BANDAGES/DRESSINGS) ×3 IMPLANT
GLOVE BIOGEL M 8.0 STRL (GLOVE) ×3 IMPLANT
GOWN SPEC L4 XLG W/TWL (GOWN DISPOSABLE) IMPLANT
GOWN STRL REUS W/TWL XL LVL3 (GOWN DISPOSABLE) ×15 IMPLANT
HANDLE STAPLE EGIA 4 XL (STAPLE) ×3 IMPLANT
HOVERMATT SINGLE USE (MISCELLANEOUS) ×3 IMPLANT
KIT BASIN OR (CUSTOM PROCEDURE TRAY) ×3 IMPLANT
KIT GASTRIC LAVAGE 34FR ADT (SET/KITS/TRAYS/PACK) ×3 IMPLANT
NDL SPNL 22GX3.5 QUINCKE BK (NEEDLE) ×1 IMPLANT
NEEDLE SPNL 22GX3.5 QUINCKE BK (NEEDLE) ×3 IMPLANT
NS IRRIG 1000ML POUR BTL (IV SOLUTION) ×3 IMPLANT
PACK CARDIOVASCULAR III (CUSTOM PROCEDURE TRAY) ×3 IMPLANT
PEN SKIN MARKING BROAD (MISCELLANEOUS) ×3 IMPLANT
QUICK LOAD TK 5 (STAPLE)
RELOAD EGIA 45 MED/THCK PURPLE (STAPLE) ×5 IMPLANT
RELOAD EGIA 45 TAN VASC (STAPLE) ×5 IMPLANT
RELOAD EGIA 60 MED/THCK PURPLE (STAPLE) ×18 IMPLANT
RELOAD EGIA 60 TAN VASC (STAPLE) ×5 IMPLANT
RELOAD ENDO STITCH 2.0 (ENDOMECHANICALS) ×27
RELOAD STAPLE 60 MED/THCK ART (STAPLE) ×2 IMPLANT
RELOAD SUT SNGL STCH ABSRB 2-0 (ENDOMECHANICALS) ×5 IMPLANT
RELOAD TRI 60 ART MED THCK PUR (STAPLE) ×4 IMPLANT
SCISSORS LAP 5X45 EPIX DISP (ENDOMECHANICALS) ×3 IMPLANT
SEALANT SURGICAL APPL DUAL CAN (MISCELLANEOUS) ×3 IMPLANT
SET IRRIG TUBING LAPAROSCOPIC (IRRIGATION / IRRIGATOR) ×3 IMPLANT
SHEARS CURVED HARMONIC AC 45CM (MISCELLANEOUS) ×3 IMPLANT
SLEEVE ADV FIXATION 12X100MM (TROCAR) ×6 IMPLANT
SLEEVE ADV FIXATION 5X100MM (TROCAR) IMPLANT
SOLUTION ANTI FOG 6CC (MISCELLANEOUS) ×3 IMPLANT
STAPLER VISISTAT 35W (STAPLE) ×3 IMPLANT
STRIP CLOSURE SKIN 1/2X4 (GAUZE/BANDAGES/DRESSINGS) IMPLANT
STRIP PERI DRY VERITAS 45 (STAPLE) IMPLANT
STRIP PERI DRY VERITAS 60 (STAPLE) ×3 IMPLANT
SUT DVC SILK 2.0X39 (SUTURE) ×6 IMPLANT
SUT RELOAD ENDO STITCH 2 48X1 (ENDOMECHANICALS) ×5
SUT RELOAD ENDO STITCH 2.0 (ENDOMECHANICALS) ×4
SUT SURGIDAC NAB ES-9 0 48 120 (SUTURE) IMPLANT
SUT VIC AB 2-0 SH 27 (SUTURE) ×3
SUT VIC AB 2-0 SH 27X BRD (SUTURE) ×1 IMPLANT
SUT VIC AB 4-0 SH 18 (SUTURE) ×3 IMPLANT
SUTURE RELOAD END STTCH 2 48X1 (ENDOMECHANICALS) ×5 IMPLANT
SUTURE RELOAD ENDO STITCH 2.0 (ENDOMECHANICALS) ×4 IMPLANT
SYR 20CC LL (SYRINGE) ×3 IMPLANT
SYR 30ML LL (SYRINGE) ×3 IMPLANT
SYR 50ML LL SCALE MARK (SYRINGE) ×3 IMPLANT
TOWEL OR 17X26 10 PK STRL BLUE (TOWEL DISPOSABLE) ×6 IMPLANT
TOWEL OR NON WOVEN STRL DISP B (DISPOSABLE) ×3 IMPLANT
TRAY FOLEY CATH 14FRSI W/METER (CATHETERS) ×3 IMPLANT
TROCAR ADV FIXATION 12X100MM (TROCAR) ×3 IMPLANT
TROCAR ADV FIXATION 5X100MM (TROCAR) ×3 IMPLANT
TROCAR BLADELESS OPT 5 100 (ENDOMECHANICALS) ×3 IMPLANT
TROCAR XCEL 12X100 BLDLESS (ENDOMECHANICALS) ×3 IMPLANT
TUBING CONNECTING 10 (TUBING) ×2 IMPLANT
TUBING CONNECTING 10' (TUBING) ×1
TUBING ENDO SMARTCAP PENTAX (MISCELLANEOUS) ×3 IMPLANT
TUBING FILTER THERMOFLATOR (ELECTROSURGICAL) ×3 IMPLANT

## 2014-03-01 NOTE — Anesthesia Preprocedure Evaluation (Addendum)
Anesthesia Evaluation  Patient identified by MRN, date of birth, ID band Patient awake    Reviewed: Allergy & Precautions, H&P , NPO status , Patient's Chart, lab work & pertinent test results, reviewed documented beta blocker date and time   History of Anesthesia Complications Negative for: history of anesthetic complications  Airway Mallampati: III TM Distance: >3 FB Neck ROM: Full    Dental no notable dental hx. (+) Teeth Intact, Dental Advisory Given   Pulmonary former smoker,  breath sounds clear to auscultation  Pulmonary exam normal       Cardiovascular hypertension, Pt. on medications and Pt. on home beta blockers Rhythm:Regular Rate:Normal     Neuro/Psych  Headaches, PSYCHIATRIC DISORDERS Anxiety Depression    GI/Hepatic Neg liver ROS, GERD-  Medicated and Controlled,  Endo/Other  diabetes, Type 2, Insulin DependentMorbid obesity  Renal/GU CRFRenal disease  negative genitourinary   Musculoskeletal negative musculoskeletal ROS (+)   Abdominal   Peds negative pediatric ROS (+)  Hematology negative hematology ROS (+)   Anesthesia Other Findings   Reproductive/Obstetrics negative OB ROS                          Anesthesia Physical Anesthesia Plan  ASA: III  Anesthesia Plan: General   Post-op Pain Management:    Induction: Intravenous  Airway Management Planned: Oral ETT  Additional Equipment:   Intra-op Plan:   Post-operative Plan: Extubation in OR  Informed Consent: I have reviewed the patients History and Physical, chart, labs and discussed the procedure including the risks, benefits and alternatives for the proposed anesthesia with the patient or authorized representative who has indicated his/her understanding and acceptance.   Dental advisory given  Plan Discussed with: CRNA  Anesthesia Plan Comments:         Anesthesia Quick Evaluation

## 2014-03-01 NOTE — Interval H&P Note (Signed)
History and Physical Interval Note:  03/01/2014 7:20 AM  Luis Joseph  has presented today for surgery, with the diagnosis of Morbid Obesity  The various methods of treatment have been discussed with the patient and family. After consideration of risks, benefits and other options for treatment, the patient has consented to  Procedure(s): LAPAROSCOPIC ROUX-EN-Y GASTRIC BYPASS WITH UPPER ENDOSCOPY (N/A) as a surgical intervention .  The patient's history has been reviewed, patient examined, no change in status, stable for surgery.  I have reviewed the patient's chart and labs.  Questions were answered to the patient's satisfaction.     Jye Fariss B

## 2014-03-01 NOTE — Op Note (Signed)
Surgeon: Althea Grimmer. Hassell Done, MD, FACS Asst:  Adonis Housekeeper, MD, FACS Anesthesia: General endotracheal Drains: None  Procedure: Laparoscopic Roux en Y gastric bypass with 40 cm BP limb and 100 cm Roux limb, antecolic, antegastric, candy cane to the left.  Closure of Peterson's defect. Upper endoscopy.   Description of Procedure:  The patient was taken to OR 1 at [facility] and given general anesthesia.  The abdomen was prepped with PCMX and draped sterilely.  A time out was performed.    The operation began by identifying the ligament of Treitz. I measured 40 cm downstream and divided the bowel with a 6 cm Covidian stapler.  I sutured a Penrose drain along the Roux limb end.  I measured a 1 meter (100 cm) Roux limb and then placed the distal bowels to the BP limb side by side and performed a stapled jejunojejunostomy using a 4.5 mm brown load. The common defect was closed from either end with 4-0 Vicryl using the Endo Stitch. The mesenteric defect was closed with a running 2-0 silk using the Endo 360. Tisseel was applied to the suture line.  The omentum was divided with the harmonic scalpel.  The Nathanson retractor was inserted in the left lateral segment of liver was retracted. The foregut dissection ensued.  5 cm along the lessor curvature I began the dissection and entered the retrogastric space.  The pouch was constructed with multiple applications of the Covidien 6 cm purple load stapler with the first two without reinforcement but with the balance using reinforcement with TRS.   The Roux limb was then brought up with the candycane pointed left and a back row of sutures of 2-0 Vicryl were placed. I opened along the right side of each structure and inserted the 4.5 cm stapler to create the gastrojejunostomy. The common defect was closed from either end with 2-0 Vicryl and a second row was placed anterior to that the Ewald tube acting as a stent across the anastomosis. The Penrose drain was removed. Peterson's  defect was closed with 2-0 silk.   Endoscopy was performed by Dr. Excell Seltzer.  No bleeding or bubbles were observed  The incisions were injected with [place] were closed with 4-0 Vicryl and Dermabond.    The patient was taken to the recovery room in satisfactory condition.  Matt B. Hassell Done, MD, FACS

## 2014-03-01 NOTE — Transfer of Care (Signed)
Immediate Anesthesia Transfer of Care Note  Patient: Luis Joseph  Procedure(s) Performed: Procedure(s): LAPAROSCOPIC ROUX-EN-Y GASTRIC BYPASS WITH UPPER ENDOSCOPY (N/A)  Patient Location: PACU  Anesthesia Type:General  Level of Consciousness: awake, alert  and oriented  Airway & Oxygen Therapy: Patient Spontanous Breathing and Patient connected to face mask oxygen  Post-op Assessment: Report given to PACU RN  Post vital signs: Reviewed and stable  Complications: No apparent anesthesia complications

## 2014-03-01 NOTE — Anesthesia Postprocedure Evaluation (Signed)
  Anesthesia Post-op Note  Patient: Luis Joseph  Procedure(s) Performed: Procedure(s) (LRB): LAPAROSCOPIC ROUX-EN-Y GASTRIC BYPASS WITH UPPER ENDOSCOPY (N/A)  Patient Location: PACU  Anesthesia Type: General  Level of Consciousness: awake and alert   Airway and Oxygen Therapy: Patient Spontanous Breathing  Post-op Pain: mild  Post-op Assessment: Post-op Vital signs reviewed, Patient's Cardiovascular Status Stable, Respiratory Function Stable, Patent Airway and No signs of Nausea or vomiting  Last Vitals:  Filed Vitals:   03/01/14 1211  BP: 136/78  Pulse: 82  Temp: 36.6 C  Resp: 12    Post-op Vital Signs: stable   Complications: No apparent anesthesia complications

## 2014-03-01 NOTE — H&P (View-Only) (Signed)
Chief Complaint: Morbid obesity with multiple comorbidities including type 2 diabetes and hypertension  History of Present Illness: Luis Joseph. 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 Damita Dunnings at [facility]. He currently works part-time at [employer]. After researching this extensively and after attending a seminar he was interested in a Roux-en-Y gastric bypass. He had seen his urologist, Dr. Tresa Moore who stated that because of the chronic problems he's had with his right kidney that he might not recommend a Roux-en-Y gastric bypass. I'll discuss this with Dr. Tresa Moore is a not certain that that should be an absolute contraindication. He does have problems with recurrent stones on the right side and currently has a stent in place. He had procedures for stones about 30 years ago and was recently found to have some large stones in his right calyx.  He has had type 2 diabetes for about 2 years. We'll go ahead and begin the workup for laparoscopic Roux-en-Y gastric bypass although we have discussed sleeve and the bypass.  Past Medical History   Diagnosis  Date   .  Hyperlipidemia  05/21/1988   .  Hypertension  05/21/1978   .  Gout  05/21/1985   .  GERD (gastroesophageal reflux disease)  05/21/1978   .  Migraines      Better after retirement   .  Diabetes mellitus, type 2    .  History of kidney stones    .  Hyperparathyroidism, secondary renal    .  Anemia in chronic kidney disease    .  Allergic rhinitis    .  Hydronephrosis, right    .  Renal cyst, left    .  CKD (chronic kidney disease), stage III      NEPHROLOGIST- DR Lavonia Dana    Past Surgical History   Procedure  Laterality  Date   .  Partial nephrectomy   12/19/1977     Due to cyst/stone removal   .  Cystoscopy w/ ureteral stent placement  Right  11/25/2012     Procedure: CYSTOSCOPY WITH RETROGRADE PYELOGRAM/URETERAL STENT PLACEMENT; Surgeon: Hanley Ben, MD;  Location: [facility]; Service: Urology; Laterality: Right;    Current Outpatient Prescriptions   Medication  Sig  Dispense  Refill   .  allopurinol (ZYLOPRIM) 100 MG tablet  Take one tablet by mouth one time daily  90 tablet  3   .  allopurinol (ZYLOPRIM) 300 MG tablet  Take 1 tablet (300 mg total) by mouth daily.  90 tablet  3   .  Cinnamon 500 MG capsule  Take 500 mg by mouth 2 (two) times daily.     .  cloNIDine (CATAPRES) 0.1 MG tablet  Take 2.5 tablets (0.25 mg total) by mouth 2 (two) times daily.     .  Coenzyme Q10 (COQ-10 PO)  Take 300 mg by mouth daily.     .  colchicine 0.6 MG tablet      .  desonide (DESOWEN) 0.05 % lotion  Apply topically 2 (two) times daily as needed. Do not use on the face.  59 mL  2   .  fexofenadine (ALLEGRA) 180 MG tablet  Take 180 mg by mouth daily.     .  magnesium gluconate (MAGONATE) 500 MG tablet  Take 500 mg by mouth daily.     .  metFORMIN (GLUCOPHAGE) 500 MG tablet  Take 500 mg by  mouth 2 (two) times daily with a meal.     .  metoprolol succinate (TOPROL-XL) 100 MG 24 hr tablet  Take 100 mg by mouth every evening. Take with or immediately following a meal.     .  Multiple Vitamin (MULTIVITAMIN) tablet  Take 1 tablet by mouth daily.     .  niacin 500 MG tablet  Take 500 mg by mouth 2 (two) times daily with a meal.     .  SUMAtriptan-naproxen (TREXIMET) 85-500 MG per tablet  Take 1 tablet by mouth every 2 (two) hours as needed. TAKE ONE TABLET BY MOUTH AT ONSET OF HEADACHE, MAY REPEAT ONCE IN 2 HOURS IF NEEDED     .  zinc gluconate 50 MG tablet  Take 50 mg by mouth daily.      No current facility-administered medications for this visit.   Codeine sulfate and Sildenafil citrate  Family History   Problem  Relation  Age of Onset   .  Heart disease  Mother    .  Diabetes  Mother    .  Hypertension  Mother    .  Cancer  Father      Lung   .  Diabetes  Sister    .  Heart disease  Maternal Grandfather    .  Diabetes  Maternal Grandfather     .  Stroke  Neg Hx    .  Prostate cancer  Neg Hx    .  Colon cancer  Neg Hx    Social History: reports that he quit smoking about 35 years ago. He quit smokeless tobacco use about 35 years ago. He reports that he does not drink alcohol or use illicit drugs.  REVIEW OF SYSTEMS - PERTINENT POSITIVES ONLY:  Positive for migraines although he has not had one recently. No history of heart disease chest pain or breathing problems. Denies history of reflux. Negative history of diarrhea. No history of DVT. Review of systems otherwise negative.  Physical Exam:  Blood pressure 152/78, pulse 68, temperature 97.3 F (36.3 C), temperature source Oral, resp. rate 20, height 5\' 11"  (1.803 m), weight 273 lb 9.6 oz (124.104 kg).  Body mass index is 38.18 kg/(m^2).  Gen: WDWN white male NAD  Neurological: Alert and oriented to person, place, and time. Motor and sensory function is grossly intact  Head: Normocephalic and atraumatic.  Eyes: Conjunctivae are normal. Pupils are equal, round, and reactive to light. No scleral icterus.  Neck: Normal range of motion. Neck supple. No tracheal deviation or thyromegaly present.  Cardiovascular: SR without murmurs or gallops. No carotid bruits  Respiratory: Effort normal. No respiratory distress. No chest wall tenderness. Breath sounds normal. No wheezes, rales or rhonchi.  Abdomen: Mildly obese nontender no rebound guarding. No prior upper abdominal surgery. He has had a right flank incision from his kidney surgery.  GU:  Musculoskeletal: Normal range of motion. Extremities are nontender. No cyanosis, edema or clubbing noted Lymphadenopathy: No cervical, preauricular, postauricular or axillary adenopathy is present Skin: Skin is warm and dry. No rash noted. No diaphoresis. No erythema. No pallor. Pscyh: Normal mood and affect. Behavior is normal. Judgment and thought content normal.  LABORATORY RESULTS:  No results found for this or any previous visit (from the past  48 hour(s)).  RADIOLOGY RESULTS:  No results found.  Problem List:  Patient Active Problem List    Diagnosis  Date Noted   .  Routine general medical examination at a health care  facility  09/29/2012   .  Elevated serum creatinine  09/29/2012   .  Migraine, unspecified, without mention of intractable migraine without mention of status migrainosus  08/04/2012   .  Erectile dysfunction  07/25/2011   .  Postnasal drip  07/25/2011   .  Muscle cramp, nocturnal  07/25/2011   .  Vertigo  07/25/2011   .  FATIGUE  06/06/2010   .  Diabetes type 2, uncontrolled  03/17/2010   .  SKIN LESION  03/17/2010   .  DERMATITIS  10/20/2009   .  ANXIETY  06/30/2008   .  UNSPECIFIED SLEEP DISTURBANCE  06/10/2008   .  OBESITY, UNSPECIFIED  04/21/2007   .  COUGH  08/27/2006   .  HYPERLIPIDEMIA  05/21/1988   .  GOUT  05/21/1985   .  HYPERTENSION  05/21/1978   .  GERD  05/21/1978   .  HEADACHE, CLUSTER  05/21/1976   Assessment & Plan:  BMI of 38 with diabetes and hypertension. Will begin down the workup for Roux-en-Y gastric bypass. We'll consider sleeve gastrectomy as an alternative. I will permit him for this.   Matt B. Hassell Done, MD, [facility]  [phone] beeper  [phone]

## 2014-03-01 NOTE — Op Note (Signed)
Upper GI endoscopy is performed at the completion of laparoscopic Roux-en-Y gastric bypass by Dr. Hassell Done. The Olympus video endoscope was inserted into the upper esophagus and then passed under direct vision to the EG junction. The small gastric pouch was insufflated with air while the gastric outlet was clamped under irrigation by the operating surgeon. There was no evidence of leak. The anastomosis was visualized and was patent. Suture and staple lines were intact and without bleeding. The pouch was tubular and measured 4-5 cm in length. At the completion of the procedure the pouch was desufflated and the scope withdrawn.

## 2014-03-02 ENCOUNTER — Inpatient Hospital Stay (HOSPITAL_COMMUNITY): Payer: No Typology Code available for payment source

## 2014-03-02 ENCOUNTER — Telehealth (INDEPENDENT_AMBULATORY_CARE_PROVIDER_SITE_OTHER): Payer: Self-pay

## 2014-03-02 ENCOUNTER — Encounter (HOSPITAL_COMMUNITY): Payer: Self-pay | Admitting: Surgery

## 2014-03-02 LAB — CBC WITH DIFFERENTIAL/PLATELET
Basophils Absolute: 0 10*3/uL (ref 0.0–0.1)
Basophils Relative: 0 % (ref 0–1)
EOS ABS: 0 10*3/uL (ref 0.0–0.7)
Eosinophils Relative: 0 % (ref 0–5)
HEMATOCRIT: 39.6 % (ref 39.0–52.0)
HEMOGLOBIN: 13.1 g/dL (ref 13.0–17.0)
LYMPHS ABS: 1.2 10*3/uL (ref 0.7–4.0)
Lymphocytes Relative: 10 % — ABNORMAL LOW (ref 12–46)
MCH: 31.7 pg (ref 26.0–34.0)
MCHC: 33.1 g/dL (ref 30.0–36.0)
MCV: 95.9 fL (ref 78.0–100.0)
MONOS PCT: 10 % (ref 3–12)
Monocytes Absolute: 1.3 10*3/uL — ABNORMAL HIGH (ref 0.1–1.0)
NEUTROS PCT: 80 % — AB (ref 43–77)
Neutro Abs: 10.4 10*3/uL — ABNORMAL HIGH (ref 1.7–7.7)
Platelets: 226 10*3/uL (ref 150–400)
RBC: 4.13 MIL/uL — ABNORMAL LOW (ref 4.22–5.81)
RDW: 13.6 % (ref 11.5–15.5)
WBC: 12.8 10*3/uL — ABNORMAL HIGH (ref 4.0–10.5)

## 2014-03-02 LAB — GLUCOSE, CAPILLARY
GLUCOSE-CAPILLARY: 178 mg/dL — AB (ref 70–99)
Glucose-Capillary: 155 mg/dL — ABNORMAL HIGH (ref 70–99)
Glucose-Capillary: 156 mg/dL — ABNORMAL HIGH (ref 70–99)
Glucose-Capillary: 161 mg/dL — ABNORMAL HIGH (ref 70–99)
Glucose-Capillary: 179 mg/dL — ABNORMAL HIGH (ref 70–99)
Glucose-Capillary: 186 mg/dL — ABNORMAL HIGH (ref 70–99)

## 2014-03-02 LAB — HEMOGLOBIN AND HEMATOCRIT, BLOOD
HEMATOCRIT: 38.9 % — AB (ref 39.0–52.0)
HEMOGLOBIN: 13.1 g/dL (ref 13.0–17.0)

## 2014-03-02 MED ORDER — IOHEXOL 300 MG/ML  SOLN
50.0000 mL | Freq: Once | INTRAMUSCULAR | Status: AC | PRN
  Administered 2014-03-02: 30 mL via ORAL

## 2014-03-02 MED ORDER — DIPHENHYDRAMINE HCL 12.5 MG/5ML PO ELIX
12.5000 mg | ORAL_SOLUTION | Freq: Every evening | ORAL | Status: DC | PRN
  Administered 2014-03-02 – 2014-03-03 (×2): 12.5 mg via ORAL
  Filled 2014-03-02 (×2): qty 5

## 2014-03-02 NOTE — Plan of Care (Signed)
Problem: Food- and Nutrition-Related Knowledge Deficit (NB-1.1) Goal: Nutrition education Formal process to instruct or train a patient/client in a skill or to impart knowledge to help patients/clients voluntarily manage or modify food choices and eating behavior to maintain or improve health. Outcome: Completed/Met Date Met:  03/02/14 Nutrition Education Note  Received consult for diet education per DROP protocol.   Discussed 2 week post op diet with pt. Emphasized that liquids must be non carbonated, non caffeinated, and sugar free. Fluid goals discussed. Pt to follow up with outpatient bariatric RD for further diet progression after 2 weeks. Multivitamins and minerals also reviewed. Teach back method used, pt expressed understanding, expect good compliance.   Diet: First 2 Weeks  You will see the nutritionist about two (2) weeks after your surgery. The nutritionist will increase the types of foods you can eat if you are handling liquids well:  If you have severe vomiting or nausea and cannot handle clear liquids lasting longer than 1 day, call your surgeon  Protein Shake  Drink at least 2 ounces of shake 5-6 times per day  Each serving of protein shakes (usually 8 - 12 ounces) should have a minimum of:  15 grams of protein  And no more than 5 grams of carbohydrate  Goal for protein each day:  Men = 80 grams per day  Women = 60 grams per day  Protein powder may be added to fluids such as non-fat milk or Lactaid milk or Soy milk (limit to 35 grams added protein powder per serving)   Hydration  Slowly increase the amount of water and other clear liquids as tolerated (See Acceptable Fluids)  Slowly increase the amount of protein shake as tolerated  Sip fluids slowly and throughout the day  May use sugar substitutes in small amounts (no more than 6 - 8 packets per day; i.e. Splenda)   Fluid Goal  The first goal is to drink at least 8 ounces of protein shake/drink per day (or as directed  by the nutritionist); some examples of protein shakes are [employer], [employer], EAS Edge HP, and Unjury. See handout from pre-op Bariatric Education Class:  Slowly increase the amount of protein shake you drink as tolerated  You may find it easier to slowly sip shakes throughout the day  It is important to get your proteins in first  Your fluid goal is to drink 64 - 100 ounces of fluid daily  It may take a few weeks to build up to this  32 oz (or more) should be clear liquids  And  32 oz (or more) should be full liquids (see below for examples)  Liquids should not contain sugar, caffeine, or carbonation   Clear Liquids:  Water or Sugar-free flavored water (i.e. Fruit H2O, Propel)  Decaffeinated coffee or tea (sugar-free)  Crystal Lite, Wyler's Lite, Minute Maid Lite  Sugar-free Jell-O  Bouillon or broth  Sugar-free Popsicle: *Less than 20 calories each; Limit 1 per day   Full Liquids:  Protein Shakes/Drinks + 2 choices per day of other full liquids  Full liquids must be:  No More Than 12 grams of Carbs per serving  No More Than 3 grams of Fat per serving  Strained low-fat cream soup  Non-Fat milk  Fat-free Lactaid Milk  Sugar-free yogurt (Dannon Lite & Fit, [place] yogurt)     Clayton Bibles, MS, RD, [place] Licensed Dietitian Nutritionist Pager: [phone]

## 2014-03-02 NOTE — Telephone Encounter (Signed)
Hospital called to inform us that the lower extremity venus duplex was negative.

## 2014-03-02 NOTE — Progress Notes (Signed)
VASCULAR LAB PRELIMINARY  PRELIMINARY  PRELIMINARY  PRELIMINARY  Bilateral lower extremity venous duplex completed.    Preliminary report:  Bilateral:  No evidence of DVT, superficial thrombosis, or Baker's Cyst.   Carylon Tamburro, RVS 03/02/2014, 10:04 AM

## 2014-03-02 NOTE — Progress Notes (Signed)
Patient ID: Luis Joseph, male   DOB: May 23, 1955, 58 y.o.   MRN: [id number] [facility] Progress Note:   1 Day Post-Op  Subjective: Mental status is clear Objective: Vital signs in last 24 hours: Temp:  [97.5 F (36.4 C)-98.9 F (37.2 C)] 98.8 F (37.1 C) (10/13 1000) Pulse Rate:  [67-83] 67 (10/13 1000) Resp:  [16-18] 18 (10/13 1000) BP: (102-179)/(44-95) 138/68 mmHg (10/13 1000) SpO2:  [95 %-100 %] 97 % (10/13 1000) Weight:  [267 lb 13.7 oz (121.5 kg)] 267 lb 13.7 oz (121.5 kg) (10/13 0546)  Intake/Output from previous day: 10/12 0701 - 10/13 0700 In: 4913.3 [I.V.:4913.3] Out: 2900 [Urine:2900] Intake/Output this shift: Total I/O In: -  Out: 500 [Urine:500]  Physical Exam: Work of breathing is normal;  incisons OK  Lab Results:  Results for orders placed during the hospital encounter of 03/01/14 (from the past 48 hour(s))  GLUCOSE, CAPILLARY     Status: Abnormal   Collection Time    03/01/14  5:37 AM      Result Value Ref Range   Glucose-Capillary 115 (*) 70 - 99 mg/dL   Comment 1 Notify RN    POCT I-STAT 4, (NA,K, GLUC, HGB,HCT)     Status: Abnormal   Collection Time    03/01/14  6:59 AM      Result Value Ref Range   Sodium 141  137 - 147 mEq/L   Potassium 4.5  3.7 - 5.3 mEq/L   Glucose, Bld 113 (*) 70 - 99 mg/dL   HCT 41.0  39.0 - 52.0 %   Hemoglobin 13.9  13.0 - 17.0 g/dL  GLUCOSE, CAPILLARY     Status: Abnormal   Collection Time    03/01/14 10:56 AM      Result Value Ref Range   Glucose-Capillary 185 (*) 70 - 99 mg/dL  CBC     Status: Abnormal   Collection Time    03/01/14 12:30 PM      Result Value Ref Range   WBC 9.8  4.0 - 10.5 K/uL   RBC 4.02 (*) 4.22 - 5.81 MIL/uL   Hemoglobin 13.1  13.0 - 17.0 g/dL   HCT 38.7 (*) 39.0 - 52.0 %   MCV 96.3  78.0 - 100.0 fL   MCH 32.6  26.0 - 34.0 pg   MCHC 33.9  30.0 - 36.0 g/dL   RDW 13.6  11.5 - 15.5 %   Platelets 191  150 - 400 K/uL  CREATININE, SERUM     Status: Abnormal   Collection Time   03/01/14 12:30 PM      Result Value Ref Range   Creatinine, Ser 2.11 (*) 0.50 - 1.35 mg/dL   GFR calc non Af Amer 32 (*) >90 mL/min   GFR calc Af Amer 38 (*) >90 mL/min   Comment: (NOTE)     The eGFR has been calculated using the CKD EPI equation.     This calculation has not been validated in all clinical situations.     eGFR's persistently <90 mL/min signify possible Chronic Kidney     Disease.  GLUCOSE, CAPILLARY     Status: Abnormal   Collection Time    03/01/14 12:34 PM      Result Value Ref Range   Glucose-Capillary 213 (*) 70 - 99 mg/dL  GLUCOSE, CAPILLARY     Status: Abnormal   Collection Time    03/01/14  4:01 PM      Result Value Ref Range  Glucose-Capillary 235 (*) 70 - 99 mg/dL  GLUCOSE, CAPILLARY     Status: Abnormal   Collection Time    03/01/14  8:01 PM      Result Value Ref Range   Glucose-Capillary 241 (*) 70 - 99 mg/dL  GLUCOSE, CAPILLARY     Status: Abnormal   Collection Time    03/01/14 11:58 PM      Result Value Ref Range   Glucose-Capillary 225 (*) 70 - 99 mg/dL  GLUCOSE, CAPILLARY     Status: Abnormal   Collection Time    03/02/14  3:57 AM      Result Value Ref Range   Glucose-Capillary 178 (*) 70 - 99 mg/dL  CBC WITH DIFFERENTIAL     Status: Abnormal   Collection Time    03/02/14  5:20 AM      Result Value Ref Range   WBC 12.8 (*) 4.0 - 10.5 K/uL   RBC 4.13 (*) 4.22 - 5.81 MIL/uL   Hemoglobin 13.1  13.0 - 17.0 g/dL   HCT 39.6  39.0 - 52.0 %   MCV 95.9  78.0 - 100.0 fL   MCH 31.7  26.0 - 34.0 pg   MCHC 33.1  30.0 - 36.0 g/dL   RDW 13.6  11.5 - 15.5 %   Platelets 226  150 - 400 K/uL   Neutrophils Relative % 80 (*) 43 - 77 %   Neutro Abs 10.4 (*) 1.7 - 7.7 K/uL   Lymphocytes Relative 10 (*) 12 - 46 %   Lymphs Abs 1.2  0.7 - 4.0 K/uL   Monocytes Relative 10  3 - 12 %   Monocytes Absolute 1.3 (*) 0.1 - 1.0 K/uL   Eosinophils Relative 0  0 - 5 %   Eosinophils Absolute 0.0  0.0 - 0.7 K/uL   Basophils Relative 0  0 - 1 %   Basophils Absolute  0.0  0.0 - 0.1 K/uL  GLUCOSE, CAPILLARY     Status: Abnormal   Collection Time    03/02/14  8:03 AM      Result Value Ref Range   Glucose-Capillary 161 (*) 70 - 99 mg/dL  GLUCOSE, CAPILLARY     Status: Abnormal   Collection Time    03/02/14 12:09 PM      Result Value Ref Range   Glucose-Capillary 179 (*) 70 - 99 mg/dL    Radiology/Results: Dg Ugi W/water Sol Cm  03/02/2014   CLINICAL DATA:  Post gastric bypass.  Roux-en-Y  EXAM: WATER SOLUBLE UPPER GI SERIES  TECHNIQUE: Single-column upper GI series was performed using water soluble contrast.  CONTRAST:  25m OMNIPAQUE IOHEXOL 300 MG/ML  SOLN  COMPARISON:  Upper GI 01/20/2013  FLUOROSCOPY TIME:  1 min 15 seconds  FINDINGS: Of contrast flows freely from the esophagus into the gastric remnant. Contrast flowed through the gastrojejunostomy into the proximal small bowel. No evidence of leak or obstruction.  IMPRESSION: No leak or obstruction following Roux-en-Y gastric bypass surgery.   Electronically Signed   By: SSuzy BouchardM.D.   On: 03/02/2014 09:13    Anti-infectives: Anti-infectives   Start     Dose/Rate Route Frequency Ordered Stop   03/01/14 0558  cefOXitin (MEFOXIN) 2 g in dextrose 5 % 50 mL IVPB     2 g 100 mL/hr over 30 Minutes Intravenous On call to O.R. 03/01/14 0558 03/01/14 0723      Assessment/Plan: Problem List: Patient Active Problem List   Diagnosis Date Noted  .  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 Morbid obesity 03/01/2014  . Abnormal x-ray 10/20/2013  . Paresthesia 10/20/2013  . Renal stone 04/24/2013  . Routine general medical examination at a health care facility 09/29/2012  . Elevated serum creatinine 09/29/2012  . Migraine, unspecified, without mention of intractable migraine without mention of status migrainosus 08/04/2012  . Erectile dysfunction 07/25/2011  . Postnasal drip 07/25/2011  . Muscle cramp, nocturnal 07/25/2011  . Vertigo 07/25/2011  . FATIGUE 06/06/2010  .  Diabetes type 2, uncontrolled 03/17/2010  . SKIN LESION 03/17/2010  . DERMATITIS 10/20/2009  . ANXIETY 06/30/2008  . UNSPECIFIED SLEEP DISTURBANCE 06/10/2008  . OBESITY, UNSPECIFIED 04/21/2007  . COUGH 08/27/2006  . HYPERLIPIDEMIA 05/21/1988  . GOUT 05/21/1985  . HYPERTENSION 05/21/1978  . GERD 05/21/1978  . HEADACHE, CLUSTER 05/21/1976    UGI looks OK.  Start PD 1 diet.   1 Day Post-Op    LOS: 1 day   Matt B. Hassell Done, MD, [facility] [phone] beeper [phone]  03/02/2014 1:50 PM

## 2014-03-02 NOTE — Care Management Note (Signed)
    Page 1 of 1   03/02/2014     2:25:15 PM CARE MANAGEMENT NOTE 03/02/2014  Patient:  Luis Joseph, Luis Joseph   Account Number:  [id number]  Date Initiated:  03/02/2014  Documentation initiated by:  Sunday Spillers  Subjective/Objective Assessment:   58 yo male admitted s/p gastric bypass. PTA lived at home with spouse.     Action/Plan:   Home when stable   Anticipated DC Date:  03/04/2014   Anticipated DC Plan:  [place]  CM consult      Choice offered to / List presented to:             Status of service:  Completed, signed off Medicare Important Message given?   (If response is "NO", the following Medicare IM given date fields will be blank) Date Medicare IM given:   Medicare IM given by:   Date Additional Medicare IM given:   Additional Medicare IM given by:    Discharge Disposition:  HOME/SELF CARE  Per UR Regulation:  Reviewed for med. necessity/level of care/duration of stay  If discussed at [place] of Stay Meetings, dates discussed:    Comments:

## 2014-03-03 LAB — CBC WITH DIFFERENTIAL/PLATELET
BASOS ABS: 0 10*3/uL (ref 0.0–0.1)
Basophils Relative: 0 % (ref 0–1)
EOS PCT: 0 % (ref 0–5)
Eosinophils Absolute: 0 10*3/uL (ref 0.0–0.7)
HEMATOCRIT: 39 % (ref 39.0–52.0)
Hemoglobin: 12.7 g/dL — ABNORMAL LOW (ref 13.0–17.0)
LYMPHS ABS: 2.1 10*3/uL (ref 0.7–4.0)
Lymphocytes Relative: 18 % (ref 12–46)
MCH: 31.7 pg (ref 26.0–34.0)
MCHC: 32.6 g/dL (ref 30.0–36.0)
MCV: 97.3 fL (ref 78.0–100.0)
MONO ABS: 0.9 10*3/uL (ref 0.1–1.0)
Monocytes Relative: 8 % (ref 3–12)
Neutro Abs: 8.3 10*3/uL — ABNORMAL HIGH (ref 1.7–7.7)
Neutrophils Relative %: 74 % (ref 43–77)
Platelets: 202 10*3/uL (ref 150–400)
RBC: 4.01 MIL/uL — ABNORMAL LOW (ref 4.22–5.81)
RDW: 13.7 % (ref 11.5–15.5)
WBC: 11.3 10*3/uL — AB (ref 4.0–10.5)

## 2014-03-03 LAB — GLUCOSE, CAPILLARY
GLUCOSE-CAPILLARY: 192 mg/dL — AB (ref 70–99)
Glucose-Capillary: 159 mg/dL — ABNORMAL HIGH (ref 70–99)
Glucose-Capillary: 170 mg/dL — ABNORMAL HIGH (ref 70–99)
Glucose-Capillary: 176 mg/dL — ABNORMAL HIGH (ref 70–99)
Glucose-Capillary: 191 mg/dL — ABNORMAL HIGH (ref 70–99)
Glucose-Capillary: 211 mg/dL — ABNORMAL HIGH (ref 70–99)

## 2014-03-03 MED ORDER — CLONIDINE HCL 0.1 MG PO TABS
0.1000 mg | ORAL_TABLET | Freq: Every day | ORAL | Status: DC
  Filled 2014-03-03 (×2): qty 1

## 2014-03-03 MED ORDER — CLONIDINE HCL 0.2 MG PO TABS
0.2000 mg | ORAL_TABLET | Freq: Every day | ORAL | Status: DC
  Administered 2014-03-04: 0.2 mg via ORAL
  Filled 2014-03-03 (×2): qty 1

## 2014-03-03 MED ORDER — SUMATRIPTAN SUCCINATE 100 MG PO TABS
100.0000 mg | ORAL_TABLET | ORAL | Status: DC | PRN
  Administered 2014-03-03 (×3): 100 mg via ORAL
  Filled 2014-03-03 (×3): qty 1

## 2014-03-03 MED ORDER — LOSARTAN POTASSIUM 50 MG PO TABS
50.0000 mg | ORAL_TABLET | Freq: Two times a day (BID) | ORAL | Status: DC
  Administered 2014-03-03 – 2014-03-04 (×2): 50 mg via ORAL
  Filled 2014-03-03 (×4): qty 1

## 2014-03-03 MED ORDER — CLONIDINE HCL 0.3 MG PO TABS
0.3000 mg | ORAL_TABLET | Freq: Every day | ORAL | Status: DC
  Administered 2014-03-03: 0.3 mg via ORAL
  Filled 2014-03-03 (×2): qty 1

## 2014-03-03 MED ORDER — METOPROLOL SUCCINATE ER 100 MG PO TB24
100.0000 mg | ORAL_TABLET | Freq: Every day | ORAL | Status: DC
  Administered 2014-03-04: 100 mg via ORAL
  Filled 2014-03-03 (×2): qty 1

## 2014-03-03 MED ORDER — PROMETHAZINE HCL 25 MG/ML IJ SOLN
12.5000 mg | Freq: Once | INTRAMUSCULAR | Status: AC
  Administered 2014-03-03: 12.5 mg via INTRAVENOUS

## 2014-03-03 MED ORDER — PROMETHAZINE HCL 25 MG/ML IJ SOLN
INTRAMUSCULAR | Status: AC
  Filled 2014-03-03: qty 1

## 2014-03-03 NOTE — Progress Notes (Signed)
Pt took 3 oz of his own Premier shake

## 2014-03-03 NOTE — Discharge Summary (Signed)
Physician Discharge Summary  Patient ID: Luis Joseph MRN: [id number] DOB/AGE: Dec 11, 1955 58 y.o.  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  PD 1 swallow looks good.  Diet advanced.  Incisions OK.  Ready for discharge  Consults: none  Significant Diagnostic Studies: UGI    Discharge Exam: Blood pressure 138/61, pulse 86, temperature 97.6 F (36.4 C), temperature source Oral, resp. rate 18, height 5\' 11"  (1.803 m), weight 265 lb 1.6 oz (120.249 kg), SpO2 98.00%. Incisions OK  Disposition: 01-Home or Self Care  Discharge Instructions   Discharge instructions    Complete by:  As directed   Follow bariatric dietary guidelines     Increase activity slowly    Complete by:  As directed             Medication List    STOP taking these medications       aspirin 325 MG tablet     GOODY HEADACHE PO      TAKE these medications       acetaminophen 500 MG tablet  Commonly known as:  TYLENOL  Take 1,000 mg by mouth once as needed for headache.     allopurinol 100 MG tablet  Commonly known as:  ZYLOPRIM  Take 100 mg by mouth every morning.     allopurinol 300 MG tablet  Commonly known as:  ZYLOPRIM  Take 300 mg by mouth at bedtime.     cloNIDine 0.1 MG tablet  Commonly known as:  CATAPRES  Take 1 tablet in the morning, 2 in the afternoon, and 3 at night     colchicine 0.6 MG tablet  Take 0.6 mg by mouth as needed. Take two tablets on the onset of symptoms. Then one tablet every hour x2 until symptoms subside.     COQ-10 PO  Take 300 mg by mouth every morning.     desonide 0.05 % lotion  Commonly known as:  DESOWEN  Apply 1 application topically 2 (two) times daily as needed (to  face.).     fexofenadine 180 MG tablet  Commonly known as:  ALLEGRA  Take 180 mg by mouth daily as needed for allergies.     glipiZIDE 5 MG tablet  Commonly known as:  GLUCOTROL  Take 5 mg by mouth 2 (two) times daily.     glipiZIDE 5 MG tablet  Commonly known as:  GLUCOTROL  Take 1 tablet by mouth twice daily before meals.     Insulin Glargine 100 UNIT/ML Solostar Pen  Commonly known as:  LANTUS  Inject into the skin. Start with 12 units injected at night, increase by 1 unit daily until AM sugars <120.  If Am sugar <80, then dec by 1 unit on next dose.     Insulin Pen Needle 31G X 5 MM Misc  Use 1 needed daily with insulin injection     losartan 50 MG tablet  Commonly known as:  COZAAR  Take 50 mg by mouth 2 (two) times daily.     magnesium oxide 400 MG tablet  Commonly known as:  MAG-OX  Take 400 mg by mouth every morning.     metoprolol succinate 100 MG 24 hr tablet  Commonly known as:  TOPROL-XL  Take 100 mg by mouth every morning. Take with or immediately following a meal.     multivitamin tablet  Take 1 tablet by mouth. Every afternoon.     niacin 500 MG tablet  Take 500 mg by mouth 2 (two) times daily with a meal.     SUMAtriptan 100 MG tablet  Commonly known as:  IMITREX  Take 100 mg by mouth every 2 (two) hours as needed for migraine or headache. May repeat in 2 hours if headache persists or recurs.     zinc gluconate 50 MG tablet  Take 50 mg by mouth. In the afternoon.           Follow-up Information   Follow up with Pedro Earls, MD.   Specialty:  General Surgery   Contact information:   [address] [place] [address] [phone]       Signed: Pedro Earls 03/03/2014, 8:16 AM

## 2014-03-04 LAB — GLUCOSE, CAPILLARY
GLUCOSE-CAPILLARY: 188 mg/dL — AB (ref 70–99)
GLUCOSE-CAPILLARY: 196 mg/dL — AB (ref 70–99)
Glucose-Capillary: 191 mg/dL — ABNORMAL HIGH (ref 70–99)

## 2014-03-04 MED ORDER — ONDANSETRON 4 MG PO TBDP: 4.0000 mg | ORAL_TABLET | Freq: Three times a day (TID) | ORAL | Status: DC | PRN

## 2014-03-04 MED ORDER — HYDROCODONE-ACETAMINOPHEN 7.5-325 MG/15ML PO SOLN
10.0000 mL | ORAL | Status: DC | PRN
  Administered 2014-03-04: 10 mL via ORAL
  Filled 2014-03-04: qty 15

## 2014-03-04 NOTE — Discharge Instructions (Addendum)

## 2014-03-04 NOTE — Progress Notes (Addendum)
Patient ID: Luis Joseph, male   DOB: [birth year], 58 y.o.   MRN: [id number] [facility] Progress Note:   3 Days Post-Op  Subjective: Mental status is clear.  Headache is better.   Objective: Vital signs in last 24 hours: Temp:  [98 F (36.7 C)-98.6 F (37 C)] 98.6 F (37 C) (10/15 1000) Pulse Rate:  [72-86] 72 (10/15 1000) Resp:  [18] 18 (10/15 1000) BP: (122-188)/(65-100) 122/69 mmHg (10/15 1000) SpO2:  [95 %-99 %] 97 % (10/15 1000)  Intake/Output from previous day: 10/14 0701 - 10/15 0700 In: 2400 [I.V.:2400] Out: 2425 [Urine:2425] Intake/Output this shift: Total I/O In: 610 [P.O.:210; I.V.:400] Out: 550 [Urine:550]  Physical Exam: Work of breathing is normal.  Abdomen is without significant pain.    Lab Results:  Results for orders placed during the hospital encounter of 03/01/14 (from the past 48 hour(s))  GLUCOSE, CAPILLARY     Status: Abnormal   Collection Time    03/02/14 12:09 PM      Result Value Ref Range   Glucose-Capillary 179 (*) 70 - 99 mg/dL  HEMOGLOBIN AND HEMATOCRIT, BLOOD     Status: Abnormal   Collection Time    03/02/14  3:52 PM      Result Value Ref Range   Hemoglobin 13.1  13.0 - 17.0 g/dL   HCT 38.9 (*) 39.0 - 52.0 %  GLUCOSE, CAPILLARY     Status: Abnormal   Collection Time    03/02/14  3:56 PM      Result Value Ref Range   Glucose-Capillary 186 (*) 70 - 99 mg/dL  GLUCOSE, CAPILLARY     Status: Abnormal   Collection Time    03/02/14  7:39 PM      Result Value Ref Range   Glucose-Capillary 156 (*) 70 - 99 mg/dL  GLUCOSE, CAPILLARY     Status: Abnormal   Collection Time    03/02/14 11:30 PM      Result Value Ref Range   Glucose-Capillary 155 (*) 70 - 99 mg/dL  GLUCOSE, CAPILLARY     Status: Abnormal   Collection Time    03/03/14  4:23 AM      Result Value Ref Range   Glucose-Capillary 176 (*) 70 - 99 mg/dL  CBC WITH DIFFERENTIAL     Status: Abnormal   Collection Time    03/03/14  5:18 AM      Result Value Ref Range   WBC 11.3 (*) 4.0 - 10.5 K/uL   RBC 4.01 (*) 4.22 - 5.81 MIL/uL   Hemoglobin 12.7 (*) 13.0 - 17.0 g/dL   HCT 39.0  39.0 - 52.0 %   MCV 97.3  78.0 - 100.0 fL   MCH 31.7  26.0 - 34.0 pg   MCHC 32.6  30.0 - 36.0 g/dL   RDW 13.7  11.5 - 15.5 %   Platelets 202  150 - 400 K/uL   Neutrophils Relative % 74  43 - 77 %   Neutro Abs 8.3 (*) 1.7 - 7.7 K/uL   Lymphocytes Relative 18  12 - 46 %   Lymphs Abs 2.1  0.7 - 4.0 K/uL   Monocytes Relative 8  3 - 12 %   Monocytes Absolute 0.9  0.1 - 1.0 K/uL   Eosinophils Relative 0  0 - 5 %   Eosinophils Absolute 0.0  0.0 - 0.7 K/uL   Basophils Relative 0  0 - 1 %   Basophils Absolute 0.0  0.0 - 0.1 K/uL  GLUCOSE, CAPILLARY     Status: Abnormal   Collection Time    03/03/14  7:51 AM      Result Value Ref Range   Glucose-Capillary 192 (*) 70 - 99 mg/dL  GLUCOSE, CAPILLARY     Status: Abnormal   Collection Time    03/03/14 12:02 PM      Result Value Ref Range   Glucose-Capillary 211 (*) 70 - 99 mg/dL  GLUCOSE, CAPILLARY     Status: Abnormal   Collection Time    03/03/14  4:01 PM      Result Value Ref Range   Glucose-Capillary 191 (*) 70 - 99 mg/dL  GLUCOSE, CAPILLARY     Status: Abnormal   Collection Time    03/03/14  8:03 PM      Result Value Ref Range   Glucose-Capillary 170 (*) 70 - 99 mg/dL  GLUCOSE, CAPILLARY     Status: Abnormal   Collection Time    03/03/14 11:36 PM      Result Value Ref Range   Glucose-Capillary 159 (*) 70 - 99 mg/dL  GLUCOSE, CAPILLARY     Status: Abnormal   Collection Time    03/04/14  5:03 AM      Result Value Ref Range   Glucose-Capillary 191 (*) 70 - 99 mg/dL  GLUCOSE, CAPILLARY     Status: Abnormal   Collection Time    03/04/14  7:36 AM      Result Value Ref Range   Glucose-Capillary 188 (*) 70 - 99 mg/dL    Radiology/Results: No results found.  Anti-infectives: Anti-infectives   Start     Dose/Rate Route Frequency Ordered Stop   03/01/14 0558  cefOXitin (MEFOXIN) 2 g in dextrose 5 % 50 mL IVPB      2 g 100 mL/hr over 30 Minutes Intravenous On call to O.R. 03/01/14 0558 03/01/14 0723      Assessment/Plan: Problem List: Patient Active Problem List   Diagnosis Date Noted  . 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 Morbid obesity 03/01/2014  . Abnormal x-ray 10/20/2013  . Paresthesia 10/20/2013  . Renal stone 04/24/2013  . Routine general medical examination at a health care facility 09/29/2012  . Elevated serum creatinine 09/29/2012  . Migraine, unspecified, without mention of intractable migraine without mention of status migrainosus 08/04/2012  . Erectile dysfunction 07/25/2011  . Postnasal drip 07/25/2011  . Muscle cramp, nocturnal 07/25/2011  . Vertigo 07/25/2011  . FATIGUE 06/06/2010  . Diabetes type 2, uncontrolled 03/17/2010  . SKIN LESION 03/17/2010  . DERMATITIS 10/20/2009  . ANXIETY 06/30/2008  . UNSPECIFIED SLEEP DISTURBANCE 06/10/2008  . OBESITY, UNSPECIFIED 04/21/2007  . COUGH 08/27/2006  . HYPERLIPIDEMIA 05/21/1988  . GOUT 05/21/1985  . HYPERTENSION 05/21/1978  . GERD 05/21/1978  . HEADACHE, CLUSTER 05/21/1976    Getting back on clonidine and trying to control migraines.  If we can control then he can be discharged today.  3 Days Post-Op    LOS: 3 days   Matt B. Hassell Done, MD, [facility] [phone] beeper [phone]  03/04/2014 11:35 AM Was unable to be discharged until today.  Migraines under control.  Taking liquids OK.  Zofran ordered.  Hycet at home.   [place] Hassell Done, MD, [facility] [phone] beeper [phone]  03/04/2014 12:52 PM

## 2014-03-04 NOTE — Progress Notes (Signed)
Patient alert and oriented, pain is controlled. Patient is tolerating fluids, advanced to protein shake tolerated well. Reviewed Gastric Bypass discharge instructions with patient and patient is able to articulate understanding. Provided information on BELT program, Support Group and [facility] outpatient pharmacy. All questions answered, will continue to monitor.

## 2014-03-04 NOTE — Progress Notes (Signed)
Discharge instructions given to patient. No questions at this time.

## 2014-03-05 ENCOUNTER — Telehealth (HOSPITAL_COMMUNITY): Payer: Self-pay

## 2014-03-05 NOTE — Telephone Encounter (Signed)
Attempted DC Phone call 03/05/14 @1032 , no answer, Left message to return call  Made discharge phone call to patient per DROP protocol. Asking the following questions.    1. Do you have someone to care for you now that you are home?   2. Are you having pain now that is not relieved by your pain medication?   3. Are you able to drink the recommended daily amount of fluids (48 ounces minimum/day) and protein (60-80 grams/day) as prescribed by the dietitian or nutritional counselor?   4. Are you taking the vitamins and minerals as prescribed?   5. Do you have the "on call" number to contact your surgeon if you have a problem or question?   6. Are your incisions free of redness, swelling or drainage? (If steri strips, address that these can fall off, shower as tolerated)  7. Have your bowels moved since your surgery?  If not, are you passing gas?   8. Are you up and walking 3-4 times per day?      1. Do you have an appointment made to see your surgeon in the next month?   2. Were you provided your discharge medications before your surgery or before you were discharged from the hospital and are you taking them without problem?   3. Were you provided phone numbers to the clinic/surgeon's office?   4. Did you watch the patient education video module in the (clinic, surgeon's office, etc.) before your surgery?  5. Do you have a discharge checklist that was provided to you in the hospital to reference with instructions on how to take care of yourself after surgery?   6. Did you see a dietitian or nutritional counselor while you were in the hospital?   7. Do you have an appointment to see a dietitian or nutritional counselor in the next month?

## 2014-03-09 ENCOUNTER — Encounter: Payer: No Typology Code available for payment source | Attending: Surgery

## 2014-03-09 DIAGNOSIS — Z6832 Body mass index (BMI) 32.0-32.9, adult: Secondary | ICD-10-CM | POA: Insufficient documentation

## 2014-03-09 DIAGNOSIS — Z713 Dietary counseling and surveillance: Secondary | ICD-10-CM | POA: Insufficient documentation

## 2014-03-09 NOTE — Progress Notes (Addendum)
Bariatric Class:  Appt start time: 1530 end time:  1630.  2 Week Post-Operative Nutrition Class  Patient was seen on 03/09/14 for Post[facility]ative [facility]. Returns today with a 16.5 lbs weight loss. Blood sugar is now about 120 mg/dl fasting (down from 300 mg/dl fasting 3 weeks ago). Overall doing well at this point. Had a migraine in the hospital d/t high blood pressure. Planning to go back to work on Saturday.   Getting in 153 g of protein per day with protein shakes.   Preferred Learning Style:   No preference indicated   Learning Readiness:   Ready  Surgery date: 03/01/2014 Surgery type: RYGB Start weight at [facility]: 277.5 lbs on 02/08/14 Weight today: 261.0 lbs  Weight change: 16.5 lbs  TANITA  BODY COMP RESULTS  02/08/14 03/09/14   BMI (kg/m^2) 38.7 36.4   Fat Mass (lbs) 102.5 102.5   Fat Free Mass (lbs) 175 158.5   Total Body Water (lbs) 128 116.0   24-hr recall: B (AM): Premier Protein (30 g) Snk (AM): [place]jury Chicken soup (21 g) L ( PM): Premier Protein (30 g) Snk (PM): [place] Chicken soup (21 g) D (PM): Premier Protein (30 g) Snk (PM): Unjury Chicken soup (21 g)  Fluid intake: SF jello, SF popsicles, water  (over 64 oz) Estimated total protein intake: 153 g protein  Medications: see list Supplementation: taking  CBG monitoring: 1 x day Average CBG per patient: around 121 md/dl (steadily dropping) Last patient reported A1c: 9.1% in July 2015  Using straws: No Drinking while eating: No Hair loss: No Carbonated beverages: No N/V/D/C: had some vomiting after surgery Dumping syndrome: No  Recent physical activity:  Mowing some yards and moved leaves (not too much lifting), walking the dog  Progress Towards Goal(s):  In progress.  Handouts given during visit include: Phase 3A: Soft, High Protein Diet Handout   Nutritional Diagnosis:  [place]-3.3 Overweight/obesity related to past poor dietary habits and  physical inactivity as evidenced by patient w/ recent RYGB surgery following dietary guidelines for continued weight loss.    Intervention:  Nutrition education/diet advancement. Recommended aiming for 80-100 g of protein per day in order to not take in too many calories which could interfere with weight loss.  Goals: Limit protein shakes (soup or Premier) to 4 per day as you transition to eating solids. Start trying solids between 10-14 days after surgery.  Follow Phase 3A-Soft, High Protein Diet and follow-up at [facility] in 6 weeks for 2 months post-op nutrition visit for diet advancement.   Teaching Method Utilized:  Visual Auditory Hands on  Barriers to learning/adherence to lifestyle change: none  Demonstrated degree of understanding via:  Teach Back   The following the learning objectives were met by the patient during this course:  Identifies Phase 3A (Soft, High Proteins) Dietary Goals and will begin from 2 weeks post-operatively to 2 months post-operatively  Identifies appropriate sources of fluids and proteins   States protein recommendations and appropriate sources post-operatively  Identifies the need for appropriate texture modifications, mastication, and bite sizes when consuming solids  Identifies appropriate multivitamin and calcium sources post-operatively  Describes the need for physical activity post-operatively and will follow MD recommendations  States when to call healthcare provider regarding medication questions or post-operative complications   Follow-Up Plan: Patient will follow-up at [facility] in 6 weeks for 2 month post-op nutrition visit for diet advancement per MD.

## 2014-03-09 NOTE — Patient Instructions (Signed)
Have 4 protein shakes (soup or Premier) per day as you transition to eating solids. Start trying solids between 10-14 days after surgery.  Patient to follow Phase 3A-Soft, High Protein Diet and follow-up at [facility] in 6 weeks for 2 months post-op nutrition visit for diet advancement.

## 2014-03-12 ENCOUNTER — Ambulatory Visit (INDEPENDENT_AMBULATORY_CARE_PROVIDER_SITE_OTHER): Payer: No Typology Code available for payment source | Admitting: Family Medicine

## 2014-03-12 ENCOUNTER — Encounter: Payer: Self-pay | Admitting: Family Medicine

## 2014-03-12 VITALS — BP 110/70 | HR 72 | Temp 97.9°F | Wt 259.2 lb

## 2014-03-12 DIAGNOSIS — E118 Type 2 diabetes mellitus with unspecified complications: Secondary | ICD-10-CM

## 2014-03-12 DIAGNOSIS — M10079 Idiopathic gout, unspecified ankle and foot: Secondary | ICD-10-CM

## 2014-03-12 DIAGNOSIS — D62 Acute posthemorrhagic anemia: Secondary | ICD-10-CM

## 2014-03-12 DIAGNOSIS — M109 Gout, unspecified: Secondary | ICD-10-CM

## 2014-03-12 DIAGNOSIS — Z8639 Personal history of other endocrine, nutritional and metabolic disease: Secondary | ICD-10-CM

## 2014-03-12 DIAGNOSIS — E1165 Type 2 diabetes mellitus with hyperglycemia: Secondary | ICD-10-CM

## 2014-03-12 DIAGNOSIS — IMO0002 Reserved for concepts with insufficient information to code with codable children: Secondary | ICD-10-CM

## 2014-03-12 DIAGNOSIS — I10 Essential (primary) hypertension: Secondary | ICD-10-CM

## 2014-03-12 DIAGNOSIS — Z8739 Personal history of other diseases of the musculoskeletal system and connective tissue: Secondary | ICD-10-CM

## 2014-03-12 NOTE — Patient Instructions (Signed)
DM2- continue to taper your insulin.  When you get down to 5 units a day and your sugar is less then 110 in the AM, then stop insulin.  When you have sugars consistently below 110 off insulin, then stop the glipizide.   BP- taper the clonidine slowly.  You can taper by 1 pill at a time. Currently 1-2-3 per day (total of 6 per day).  Taper to 1-2-2, then 1-1-2, then 1-1-1, then 1-0-1, then 0-0-1, then off.  Goal BP 120-130s/80s.   We'll let you know about the allopurinol.  You'll likely taper by 100mg  a time, over a period of weeks to months.   Plan on follow up here in 3 months, sooner if needed.    Go to the lab on the way out.  We'll contact you with your lab report. Take care.  Glad to see you.

## 2014-03-12 NOTE — Progress Notes (Signed)
Pre visit review using our clinic review tool, if applicable. No additional management support is needed unless otherwise documented below in the visit note.  11 days out from gastric bypass.  Weight loss noted.  No FCNAVD. No abd pain usually, 0-1/10 pain.  Normal BMs.  No complications form surgery.  On listed meds today.  Still on liquid diet.  Has has surgery f/u in the meantime.  He'll check on his next f/u with surgery.  He sees nutrition on 04/20/14.    Taking 12 units of insulin last few nights, with AM sugar 110-120.  We discussed his plan  For insulin taper then glipizide.   Occ lightheaded with standing.  Taking clonidine 1 tablet in the morning, 2 in the afternoon, and 3 at night.  Has BP cuff at home.  We discussed his BP med taper.  He tends to have more migraines when his BP is up.   Meds, vitals, and allergies reviewed.   ROS: See HPI.  Otherwise, noncontributory.  GEN: nad, alert and oriented HEENT: mucous membranes moist NECK: supple w/o LA CV: rrr.  no murmur PULM: ctab, no inc wob ABD: soft, +bs, surgical sites well healed.  EXT: no edema SKIN: no acute rash

## 2014-03-13 LAB — CBC WITH DIFFERENTIAL/PLATELET
BASOS ABS: 0.1 10*3/uL (ref 0.0–0.1)
Basophils Relative: 1 % (ref 0–1)
EOS ABS: 0.2 10*3/uL (ref 0.0–0.7)
Eosinophils Relative: 3 % (ref 0–5)
HCT: 35.6 % — ABNORMAL LOW (ref 39.0–52.0)
HEMOGLOBIN: 12.4 g/dL — AB (ref 13.0–17.0)
Lymphocytes Relative: 24 % (ref 12–46)
Lymphs Abs: 1.8 10*3/uL (ref 0.7–4.0)
MCH: 31.8 pg (ref 26.0–34.0)
MCHC: 34.8 g/dL (ref 30.0–36.0)
MCV: 91.3 fL (ref 78.0–100.0)
MONOS PCT: 11 % (ref 3–12)
Monocytes Absolute: 0.8 10*3/uL (ref 0.1–1.0)
NEUTROS PCT: 61 % (ref 43–77)
Neutro Abs: 4.6 10*3/uL (ref 1.7–7.7)
PLATELETS: 273 10*3/uL (ref 150–400)
RBC: 3.9 MIL/uL — ABNORMAL LOW (ref 4.22–5.81)
RDW: 14.8 % (ref 11.5–15.5)
WBC: 7.5 10*3/uL (ref 4.0–10.5)

## 2014-03-13 LAB — COMPREHENSIVE METABOLIC PANEL
ALBUMIN: 3.7 g/dL (ref 3.5–5.2)
ALT: 37 U/L (ref 0–53)
AST: 26 U/L (ref 0–37)
Alkaline Phosphatase: 97 U/L (ref 39–117)
BUN: 58 mg/dL — ABNORMAL HIGH (ref 6–23)
CHLORIDE: 106 meq/L (ref 96–112)
CO2: 23 mEq/L (ref 19–32)
Calcium: 9.2 mg/dL (ref 8.4–10.5)
Creat: 2.01 mg/dL — ABNORMAL HIGH (ref 0.50–1.35)
Glucose, Bld: 111 mg/dL — ABNORMAL HIGH (ref 70–99)
POTASSIUM: 4.2 meq/L (ref 3.5–5.3)
SODIUM: 140 meq/L (ref 135–145)
TOTAL PROTEIN: 6.4 g/dL (ref 6.0–8.3)
Total Bilirubin: 0.6 mg/dL (ref 0.2–1.2)

## 2014-03-13 LAB — URIC ACID: Uric Acid, Serum: 5.9 mg/dL (ref 4.0–7.8)

## 2014-03-14 ENCOUNTER — Other Ambulatory Visit: Payer: Self-pay | Admitting: Family Medicine

## 2014-03-14 DIAGNOSIS — M109 Gout, unspecified: Secondary | ICD-10-CM

## 2014-03-14 DIAGNOSIS — E1165 Type 2 diabetes mellitus with hyperglycemia: Secondary | ICD-10-CM

## 2014-03-14 DIAGNOSIS — IMO0002 Reserved for concepts with insufficient information to code with codable children: Secondary | ICD-10-CM

## 2014-03-14 NOTE — Assessment & Plan Note (Signed)
We can address allopurinol taper- see notes on labs.

## 2014-03-14 NOTE — Assessment & Plan Note (Signed)
We discussed his plan for insulin taper then glipizide.   He'll pull back by 1 unit per day as his sugars allow, then he can stop glipizide if his sugar is still low enough.  It likely will be with the likely weight loss to follow.  He agrees.

## 2014-03-14 NOTE — Assessment & Plan Note (Signed)
Will taper the clonidine slowly, by 1 pill at a time. Currently 1-2-3 per day (total of 6 per day). Taper to 1-2-2, then 1-1-2, then 1-1-1, then 1-0-1, then 0-0-1, then off. Goal BP 120-130s/80s.  D/w pt. He agrees.  >25 minutes spent in face to face time with patient, >50% spent in counselling or coordination of care.

## 2014-03-15 ENCOUNTER — Telehealth: Payer: Self-pay | Admitting: Family Medicine

## 2014-03-15 NOTE — Telephone Encounter (Signed)
emmi emailed °

## 2014-03-16 ENCOUNTER — Ambulatory Visit (INDEPENDENT_AMBULATORY_CARE_PROVIDER_SITE_OTHER): Payer: No Typology Code available for payment source | Admitting: Surgery

## 2014-03-16 ENCOUNTER — Ambulatory Visit: Payer: No Typology Code available for payment source

## 2014-04-17 ENCOUNTER — Other Ambulatory Visit: Payer: Self-pay | Admitting: Family Medicine

## 2014-04-20 ENCOUNTER — Encounter: Payer: No Typology Code available for payment source | Attending: Surgery | Admitting: Dietician

## 2014-04-20 DIAGNOSIS — Z6832 Body mass index (BMI) 32.0-32.9, adult: Secondary | ICD-10-CM | POA: Insufficient documentation

## 2014-04-20 DIAGNOSIS — Z713 Dietary counseling and surveillance: Secondary | ICD-10-CM | POA: Diagnosis not present

## 2014-04-20 NOTE — Patient Instructions (Signed)
Goals:  Follow Phase 3B: High Protein + Non-Starchy Vegetables  Eat 3-6 small meals/snacks, every 3-5 hrs  Increase lean protein foods to meet 80g goal  Increase fluid intake to 64oz +  Avoid drinking 15 minutes before, during and 30 minutes after eating  Aim for >30 min of physical activity daily  TANITA  BODY COMP RESULTS  02/08/14 03/09/14 04/20/14   BMI (kg/m^2) 38.7 36.4 32.6   Fat Mass (lbs) 102.5 102.5 73   Fat Free Mass (lbs) 175 158.5 160.5   Total Body Water (lbs) 128 116.0 117.5

## 2014-04-20 NOTE — Progress Notes (Signed)
  Follow-up visit:  8 Weeks Post-Operative RYGB Surgery  Medical Nutrition Therapy:  Appt start time: [id number] end time:  0900.  Primary concerns today: Post-operative Bariatric Surgery Nutrition Management. Tolerating recommended foods, ate too much yesterday.   Surgery date: 03/01/2014 Surgery type: RYGB Start weight at [facility]: 277.5 lbs on 02/08/14 Weight today: 233.5 lbs Weight change: 27.5 lbs Total weight loss: 44 lbs  Weight loss goal: 190-200 lbs  TANITA  BODY COMP RESULTS  02/08/14 03/09/14 04/20/14   BMI (kg/m^2) 38.7 36.4 32.6   Fat Mass (lbs) 102.5 102.5 73   Fat Free Mass (lbs) 175 158.5 160.5   Total Body Water (lbs) 128 116.0 117.5    Preferred Learning Style:   No preference indicated   Learning Readiness:   Ready  24-hr recall: B (AM): 2 eggs with cheese OR Premier protein shake (15-30g) Snk (AM):   L (PM): 2 oz grilled chicken (14g) Snk (PM): popsicle, Premier protein shake (30g) D (PM): Wendy's chili (12g) Snk (PM):   Fluid intake: 22 oz protein shake; 40-60 oz water Estimated total protein intake: ~80g per day  Medications: see list; taking less blood pressure medicine and insulin Supplementation: taking  CBG monitoring: 1x a day Average CBG per patient: 70-120 mg/dL Last patient reported A1c: no recent  Drinking while eating: sips Hair loss: no Carbonated beverages: none N/V/D/C: constipation, taking herbal colon cleanse Dumping syndrome: none  Recent physical activity:  Walking some  Progress Towards Goal(s):  In progress.  Handouts given during visit include:  Phase 3B lean protein + non-starchy vegetables   Nutritional Diagnosis:  [place]-3.3 Overweight/obesity related to past poor dietary habits and physical inactivity as evidenced by patient w/ recent RYGB surgery following dietary guidelines for continued weight loss.     Intervention:  Nutrition counseling provided.  Teaching Method Utilized:  Visual Auditory  Barriers to  learning/adherence to lifestyle change: none  Demonstrated degree of understanding via:  Teach Back   Monitoring/Evaluation:  Dietary intake, exercise, and body weight. Follow up in 2 months for 4 month post-op visit.

## 2014-05-03 ENCOUNTER — Other Ambulatory Visit: Payer: Self-pay | Admitting: Family Medicine

## 2014-05-04 NOTE — Telephone Encounter (Signed)
Sent. Thanks.   

## 2014-05-04 NOTE — Telephone Encounter (Signed)
Electronic refill request. ? Refill date.  Please advise.

## 2014-05-12 ENCOUNTER — Other Ambulatory Visit: Payer: Self-pay | Admitting: Family Medicine

## 2014-05-19 ENCOUNTER — Other Ambulatory Visit (INDEPENDENT_AMBULATORY_CARE_PROVIDER_SITE_OTHER): Payer: Self-pay

## 2014-05-19 DIAGNOSIS — K909 Intestinal malabsorption, unspecified: Secondary | ICD-10-CM

## 2014-05-19 DIAGNOSIS — Z9884 Bariatric surgery status: Secondary | ICD-10-CM

## 2014-05-19 DIAGNOSIS — E1110 Type 2 diabetes mellitus with ketoacidosis without coma: Secondary | ICD-10-CM

## 2014-06-10 ENCOUNTER — Other Ambulatory Visit (INDEPENDENT_AMBULATORY_CARE_PROVIDER_SITE_OTHER): Payer: No Typology Code available for payment source

## 2014-06-10 DIAGNOSIS — I10 Essential (primary) hypertension: Secondary | ICD-10-CM

## 2014-06-10 DIAGNOSIS — M109 Gout, unspecified: Secondary | ICD-10-CM

## 2014-06-10 DIAGNOSIS — IMO0002 Reserved for concepts with insufficient information to code with codable children: Secondary | ICD-10-CM

## 2014-06-10 DIAGNOSIS — E785 Hyperlipidemia, unspecified: Secondary | ICD-10-CM

## 2014-06-10 DIAGNOSIS — R5383 Other fatigue: Secondary | ICD-10-CM

## 2014-06-10 DIAGNOSIS — M10079 Idiopathic gout, unspecified ankle and foot: Secondary | ICD-10-CM

## 2014-06-10 DIAGNOSIS — E1165 Type 2 diabetes mellitus with hyperglycemia: Secondary | ICD-10-CM

## 2014-06-10 LAB — COMPREHENSIVE METABOLIC PANEL
ALT: 73 U/L — AB (ref 0–53)
AST: 54 U/L — ABNORMAL HIGH (ref 0–37)
Albumin: 3.7 g/dL (ref 3.5–5.2)
Alkaline Phosphatase: 123 U/L — ABNORMAL HIGH (ref 39–117)
BUN: 34 mg/dL — ABNORMAL HIGH (ref 6–23)
CALCIUM: 9.8 mg/dL (ref 8.4–10.5)
CO2: 26 meq/L (ref 19–32)
Chloride: 107 mEq/L (ref 96–112)
Creatinine, Ser: 1.65 mg/dL — ABNORMAL HIGH (ref 0.40–1.50)
GFR: 45.4 mL/min — ABNORMAL LOW (ref >60.00)
GLUCOSE: 98 mg/dL (ref 70–99)
Potassium: 3.8 mEq/L (ref 3.5–5.1)
Sodium: 140 mEq/L (ref 135–145)
Total Bilirubin: 0.4 mg/dL (ref 0.2–1.2)
Total Protein: 7 g/dL (ref 6.0–8.3)

## 2014-06-10 LAB — CBC WITH DIFFERENTIAL/PLATELET
BASOS PCT: 0.5 % (ref 0.0–3.0)
Basophils Absolute: 0 10*3/uL (ref 0.0–0.1)
EOS ABS: 0.2 10*3/uL (ref 0.0–0.7)
Eosinophils Relative: 3.5 % (ref 0.0–5.0)
HCT: 37.6 % — ABNORMAL LOW (ref 39.0–52.0)
Hemoglobin: 12.2 g/dL — ABNORMAL LOW (ref 13.0–17.0)
Lymphocytes Relative: 37.3 % (ref 12.0–46.0)
Lymphs Abs: 2.6 10*3/uL (ref 0.7–4.0)
MCHC: 32.4 g/dL (ref 30.0–36.0)
MCV: 97.1 fl (ref 78.0–100.0)
Monocytes Absolute: 0.5 10*3/uL (ref 0.1–1.0)
Monocytes Relative: 7.4 % (ref 3.0–12.0)
NEUTROS ABS: 3.5 10*3/uL (ref 1.4–7.7)
Neutrophils Relative %: 51.3 % (ref 43.0–77.0)
Platelets: 211 10*3/uL (ref 150.0–400.0)
RBC: 3.88 Mil/uL — ABNORMAL LOW (ref 4.22–5.81)
RDW: 15.4 % (ref 11.5–15.5)
WBC: 6.9 10*3/uL (ref 4.0–10.5)

## 2014-06-10 LAB — IBC PANEL
IRON: 53 ug/dL (ref 42–165)
SATURATION RATIOS: 16.8 % — AB (ref 20.0–50.0)
Transferrin: 226 mg/dL (ref 212.0–360.0)

## 2014-06-10 LAB — LIPID PANEL
CHOL/HDL RATIO: 4
CHOLESTEROL: 125 mg/dL (ref 0–200)
HDL: 35.1 mg/dL — AB (ref >39.00)
LDL CALC: 69 mg/dL (ref 0–99)
NonHDL: 89.9
Triglycerides: 106 mg/dL (ref 0.0–149.0)
VLDL: 21.2 mg/dL (ref 0.0–40.0)

## 2014-06-10 LAB — URIC ACID: Uric Acid, Serum: 4.3 mg/dL (ref 4.0–7.8)

## 2014-06-10 LAB — HEMOGLOBIN A1C: Hgb A1c MFr Bld: 5.5 % (ref 4.6–6.5)

## 2014-06-10 LAB — VITAMIN D 25 HYDROXY (VIT D DEFICIENCY, FRACTURES): VITD: 42.79 ng/mL (ref 30.00–100.00)

## 2014-06-14 ENCOUNTER — Encounter: Payer: Self-pay | Admitting: Family Medicine

## 2014-06-14 ENCOUNTER — Ambulatory Visit (INDEPENDENT_AMBULATORY_CARE_PROVIDER_SITE_OTHER): Payer: No Typology Code available for payment source | Admitting: Family Medicine

## 2014-06-14 VITALS — BP 116/76 | HR 63 | Temp 98.4°F | Wt 221.5 lb

## 2014-06-14 DIAGNOSIS — R7989 Other specified abnormal findings of blood chemistry: Secondary | ICD-10-CM

## 2014-06-14 DIAGNOSIS — E119 Type 2 diabetes mellitus without complications: Secondary | ICD-10-CM

## 2014-06-14 DIAGNOSIS — R748 Abnormal levels of other serum enzymes: Secondary | ICD-10-CM

## 2014-06-14 DIAGNOSIS — M10079 Idiopathic gout, unspecified ankle and foot: Secondary | ICD-10-CM

## 2014-06-14 DIAGNOSIS — I10 Essential (primary) hypertension: Secondary | ICD-10-CM

## 2014-06-14 DIAGNOSIS — M109 Gout, unspecified: Secondary | ICD-10-CM

## 2014-06-14 DIAGNOSIS — E785 Hyperlipidemia, unspecified: Secondary | ICD-10-CM

## 2014-06-14 NOTE — Patient Instructions (Addendum)
Stop the 100mg  of allopurinol, continue 300mg  a day for now.  Stop glipizide for now.  Keep tapering your insulin if needed. If you get below 5 units, you can likely stop it.  Recheck labs in about 3 months before a visit.  Take care.  Glad to see you.
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
## 2014-06-15 ENCOUNTER — Encounter: Payer: Self-pay | Admitting: Family Medicine

## 2014-06-15 ENCOUNTER — Other Ambulatory Visit: Payer: Self-pay | Admitting: Family Medicine

## 2014-06-15 MED ORDER — LOSARTAN POTASSIUM 50 MG PO TABS: 50.0000 mg | ORAL_TABLET | Freq: Every day | ORAL | Status: DC

## 2014-06-15 NOTE — Assessment & Plan Note (Signed)
Much improved, labs d/w pt. Continue as is for now.

## 2014-06-15 NOTE — Assessment & Plan Note (Signed)
Improved on recent labs, continue as is for now.

## 2014-06-15 NOTE — Assessment & Plan Note (Signed)
Much improved with weight loss, stop glipizide.  He'll likely taper his insulin in the meantime.  Recheck in a few months.  Continue diet for gastric surgery.  Anticipate continued weight loss.

## 2014-06-15 NOTE — Assessment & Plan Note (Signed)
Taper allopurinol to 300mg  a day and we'll likely continue taper later on. D/w pt.

## 2014-06-15 NOTE — Assessment & Plan Note (Signed)
No change in meds for now, but with likely continued weight loss we'll try to taper his meds.  D/w pt.

## 2014-06-17 ENCOUNTER — Encounter: Payer: Self-pay | Admitting: Family Medicine

## 2014-06-17 LAB — GLUCOSE (CC13)
BUN: 30 mg/dL — AB (ref 4–21)
Creatinine, Ser: 1.67
Glucose: 90

## 2014-06-21 ENCOUNTER — Encounter: Payer: Self-pay | Admitting: Family Medicine

## 2014-06-22 ENCOUNTER — Encounter: Payer: No Typology Code available for payment source | Attending: Surgery | Admitting: Dietician

## 2014-06-22 VITALS — Ht 71.0 in | Wt 212.0 lb

## 2014-06-22 DIAGNOSIS — Z713 Dietary counseling and surveillance: Secondary | ICD-10-CM | POA: Diagnosis not present

## 2014-06-22 DIAGNOSIS — Z6832 Body mass index (BMI) 32.0-32.9, adult: Secondary | ICD-10-CM | POA: Insufficient documentation

## 2014-06-22 DIAGNOSIS — E669 Obesity, unspecified: Secondary | ICD-10-CM

## 2014-06-22 NOTE — Progress Notes (Signed)
  Follow-up visit:  4 months Post-Operative RYGB Surgery  Medical Nutrition Therapy:  Appt start time: 940 end time:  1000  Primary concerns today: Post-operative Bariatric Surgery Nutrition Management. Tolerating recommended foods. Hesitant to add carbohydrate foods but curious about new foods to add to diet.    Surgery date: 03/01/2014 Surgery type: RYGB Start weight at [facility]: 277.5 lbs on 02/08/14 (282.5 per patient) Weight today: 212 lbs Weight change: 21.5 lbs Total weight loss: 70.5 lbs  Weight loss goal: 190-200 lbs  TANITA  BODY COMP RESULTS  02/08/14 03/09/14 04/20/14 06/22/14   BMI (kg/m^2) 38.7 36.4 32.6 29.6   Fat Mass (lbs) 102.5 102.5 73 54.5   Fat Free Mass (lbs) 175 158.5 160.5 157.5   Total Body Water (lbs) 128 116.0 117.5 115.5    Preferred Learning Style:   No preference indicated   Learning Readiness:   Ready  24-hr recall: B (AM): Premier protein shake (30g) OR egg mcmuffin without muffin Snk (AM):   L (PM): 1/2 Wendy's chili OR 2 oz grilled chicken (14g) sometimes with fried zucchini Snk (PM): sometimes a Premier protein shake D (PM): 1/2 Wendy's chili (12g) Snk (PM):   Fluid intake: 11-22 oz protein shake; 40-60 oz water Estimated total protein intake: ~80g per day  Medications: no longer taking glipizide Supplementation: taking  CBG monitoring: 1x a day Average CBG per patient: varies, has increased since stopping glipizide Last patient reported A1c: 5.5%  Drinking while eating: sips Hair loss: no Carbonated beverages: none N/V/D/C: constipation resolved until this past week Dumping syndrome: none  Recent physical activity: recently joined [employer] (treadmill and body weight exercises 1x a week)  Progress Towards Goal(s):  In progress.  Handouts given during visit include: Bariatric snack list   Nutritional Diagnosis:  [place]-3.3 Overweight/obesity related to past poor dietary habits and physical inactivity as evidenced by patient w/  recent RYGB surgery following dietary guidelines for continued weight loss.     Intervention:  Nutrition counseling provided. Discussed adding healthy fats like nuts and avocado. Encouraged patient to add a snack if he gets hungry.   Teaching Method Utilized:  Visual Auditory  Barriers to learning/adherence to lifestyle change: none  Demonstrated degree of understanding via:  Teach Back   Monitoring/Evaluation:  Dietary intake, exercise, and body weight. Follow up in 2 months for 4 month post-op visit.

## 2014-06-22 NOTE — Patient Instructions (Addendum)
TANITA  BODY COMP RESULTS  02/08/14 03/09/14 04/20/14 06/22/14   BMI (kg/m^2) 38.7 36.4 32.6 29.6   Fat Mass (lbs) 102.5 102.5 73 54.5   Fat Free Mass (lbs) 175 158.5 160.5 157.5   Total Body Water (lbs) 128 116.0 117.5 115.5

## 2014-07-24 ENCOUNTER — Other Ambulatory Visit: Payer: Self-pay | Admitting: Family Medicine

## 2014-07-26 NOTE — Telephone Encounter (Signed)
This medication has not been filled since 11/2013--last OV with you 06/14/14--upcoming OV with you 08/2014--please advise if okay to refill

## 2014-07-27 NOTE — Telephone Encounter (Signed)
Sig updated, sent.  Thanks.

## 2014-09-09 ENCOUNTER — Other Ambulatory Visit (INDEPENDENT_AMBULATORY_CARE_PROVIDER_SITE_OTHER): Payer: No Typology Code available for payment source

## 2014-09-09 DIAGNOSIS — R6882 Decreased libido: Secondary | ICD-10-CM

## 2014-09-09 DIAGNOSIS — E119 Type 2 diabetes mellitus without complications: Secondary | ICD-10-CM

## 2014-09-09 DIAGNOSIS — M10079 Idiopathic gout, unspecified ankle and foot: Secondary | ICD-10-CM | POA: Diagnosis not present

## 2014-09-09 NOTE — Addendum Note (Signed)
Addended by: Marchia Bond on: 09/09/2014 04:34 PM   Modules accepted: Orders

## 2014-09-10 LAB — HEMOGLOBIN A1C
Hgb A1c MFr Bld: 6 % — ABNORMAL HIGH (ref <5.7)
Mean Plasma Glucose: 126 mg/dL — ABNORMAL HIGH (ref <117)

## 2014-09-10 LAB — BASIC METABOLIC PANEL
BUN: 35 mg/dL — AB (ref 6–23)
CO2: 24 mEq/L (ref 19–32)
Calcium: 9 mg/dL (ref 8.4–10.5)
Chloride: 106 mEq/L (ref 96–112)
Creat: 1.46 mg/dL — ABNORMAL HIGH (ref 0.50–1.35)
Glucose, Bld: 91 mg/dL (ref 70–99)
POTASSIUM: 4.4 meq/L (ref 3.5–5.3)
Sodium: 142 mEq/L (ref 135–145)

## 2014-09-10 LAB — URIC ACID: Uric Acid, Serum: 4.7 mg/dL (ref 4.0–7.8)

## 2014-09-10 LAB — TESTOSTERONE: TESTOSTERONE: 489 ng/dL (ref 300–890)

## 2014-09-14 ENCOUNTER — Ambulatory Visit (INDEPENDENT_AMBULATORY_CARE_PROVIDER_SITE_OTHER): Payer: No Typology Code available for payment source | Admitting: Family Medicine

## 2014-09-14 ENCOUNTER — Encounter: Payer: Self-pay | Admitting: Family Medicine

## 2014-09-14 VITALS — BP 104/64 | HR 58 | Temp 98.4°F | Wt 198.5 lb

## 2014-09-14 DIAGNOSIS — I10 Essential (primary) hypertension: Secondary | ICD-10-CM

## 2014-09-14 DIAGNOSIS — E119 Type 2 diabetes mellitus without complications: Secondary | ICD-10-CM

## 2014-09-14 DIAGNOSIS — R748 Abnormal levels of other serum enzymes: Secondary | ICD-10-CM

## 2014-09-14 DIAGNOSIS — N189 Chronic kidney disease, unspecified: Secondary | ICD-10-CM

## 2014-09-14 DIAGNOSIS — R7989 Other specified abnormal findings of blood chemistry: Secondary | ICD-10-CM

## 2014-09-14 MED ORDER — CLONIDINE HCL 0.1 MG PO TABS: 0.1000 mg | ORAL_TABLET | Freq: Two times a day (BID) | ORAL | Status: DC

## 2014-09-14 NOTE — Progress Notes (Signed)
Pre visit review using our clinic review tool, if applicable. No additional management support is needed unless otherwise documented below in the visit note.  H/o gastric bypass.  Sig weight loss noted.  Fasting sugar usually ~100.  A1c 6. D/w pt.  Total weight loss about 90 lbs.  He feels great.  No migraines.    Hypertension:    Using medication without problems or lightheadedness: yes Chest pain with exertion:no Edema:no Short of breath:no He had some mild L sided soreness in the chest about 1 week ago.  Resolve in the meantime.  No exertional sx.  He agreed not to w/u this up, since it was resolved, atypical, and mild.  Unclear if this was from MSK source.   ED noted, unclear if related from low BP.  Compliant with meds.    CKD.  Cr improved.  D/w pt.  Renal had requested u/a with urine pro/cr ration and PTH.     Meds, vitals, and allergies reviewed.   ROS: See HPI.  Otherwise, noncontributory.  GEN: nad, alert and oriented HEENT: mucous membranes moist NECK: supple w/o LA CV: rrr.  no murmur, chest not ttp PULM: ctab, no inc wob ABD: soft, +bs EXT: no edema SKIN: no acute rash

## 2014-09-14 NOTE — Patient Instructions (Addendum)
Cut out the PM dose of clonidine.  If you can, taper off the other doses of clonidine, assuming your BP allows.   Take care.  Glad to see you.   Go to the lab on the way out.  We'll contact you with your lab report. Recheck in about 3-4 months, labs ahead of time.

## 2014-09-15 LAB — PTH, INTACT AND CALCIUM
Calcium: 9.2 mg/dL (ref 8.4–10.5)
PTH: 56 pg/mL (ref 14–64)

## 2014-09-15 LAB — MICROALBUMIN / CREATININE URINE RATIO
CREATININE, URINE: 108.3 mg/dL
MICROALB UR: 78.1 mg/dL — AB (ref <2.0)
MICROALB/CREAT RATIO: 721.1 mg/g — AB (ref 0.0–30.0)

## 2014-09-15 NOTE — Assessment & Plan Note (Signed)
See discussion re: clonidine taper.  Continue other meds for now.  He agrees.

## 2014-09-15 NOTE — Assessment & Plan Note (Signed)
Still much improved, continue as is with med and weight loss.  He'll work on exercise.  He may be able to taper off insulin, depending on his sugars, esp with more exercise.  He agrees. Recheck in a few months, sooner if needed.

## 2014-09-15 NOTE — Assessment & Plan Note (Signed)
Okay to try to taper down/off clonidine as tolerated by BP.  He agrees.  Will check extra labs per renal request and send over.  He agrees.  See notes on labs.  D/w pt about meds and cr, with goal to preserve GFR.  He agrees.

## 2014-09-28 ENCOUNTER — Ambulatory Visit: Payer: No Typology Code available for payment source | Admitting: Dietician

## 2014-10-02 ENCOUNTER — Other Ambulatory Visit: Payer: Self-pay | Admitting: Family Medicine

## 2014-10-20 ENCOUNTER — Other Ambulatory Visit: Payer: Self-pay | Admitting: Family Medicine

## 2014-10-20 NOTE — Telephone Encounter (Signed)
Received refill request electronically from pharmacy. Last office visit 09/14/14. Is it okay to refill medication?

## 2014-10-20 NOTE — Telephone Encounter (Signed)
Sent. Thanks.   

## 2014-10-28 ENCOUNTER — Other Ambulatory Visit: Payer: Self-pay | Admitting: Family Medicine

## 2014-11-17 ENCOUNTER — Other Ambulatory Visit: Payer: Self-pay

## 2014-11-17 NOTE — Telephone Encounter (Signed)
Pt request refill one touch ultra test strips to [facility] in [employer] on [address]. Pt cannot remember what doctor gave him the strips previously. Pt cks BS once daily and is out of strips.Please advise. Pt seen 09/14/14 for Fu appt and has appt with Dr Damita Dunnings on 12/14/14.

## 2014-11-18 MED ORDER — GLUCOSE BLOOD VI STRP: ORAL_STRIP | Status: DC

## 2014-11-18 NOTE — Telephone Encounter (Signed)
Sent. Thanks.   

## 2014-12-09 ENCOUNTER — Other Ambulatory Visit: Payer: Self-pay | Admitting: Family Medicine

## 2014-12-09 ENCOUNTER — Other Ambulatory Visit (INDEPENDENT_AMBULATORY_CARE_PROVIDER_SITE_OTHER): Payer: No Typology Code available for payment source

## 2014-12-09 DIAGNOSIS — M109 Gout, unspecified: Secondary | ICD-10-CM

## 2014-12-09 DIAGNOSIS — E785 Hyperlipidemia, unspecified: Secondary | ICD-10-CM

## 2014-12-09 DIAGNOSIS — E119 Type 2 diabetes mellitus without complications: Secondary | ICD-10-CM

## 2014-12-09 LAB — HEMOGLOBIN A1C
HEMOGLOBIN A1C: 5.8 % — AB (ref <5.7)
Mean Plasma Glucose: 120 mg/dL — ABNORMAL HIGH (ref <117)

## 2014-12-09 LAB — HM DIABETES EYE EXAM

## 2014-12-10 LAB — BASIC METABOLIC PANEL
BUN: 55 mg/dL — ABNORMAL HIGH (ref 6–23)
CO2: 24 mEq/L (ref 19–32)
Calcium: 9.4 mg/dL (ref 8.4–10.5)
Chloride: 106 mEq/L (ref 96–112)
Creat: 1.81 mg/dL — ABNORMAL HIGH (ref 0.50–1.35)
GLUCOSE: 93 mg/dL (ref 70–99)
Potassium: 4.7 mEq/L (ref 3.5–5.3)
Sodium: 141 mEq/L (ref 135–145)

## 2014-12-10 LAB — MICROALBUMIN / CREATININE URINE RATIO
CREATININE, URINE: 83.8 mg/dL
Microalb Creat Ratio: 303.1 mg/g — ABNORMAL HIGH (ref 0.0–30.0)
Microalb, Ur: 25.4 mg/dL — ABNORMAL HIGH (ref <2.0)

## 2014-12-10 LAB — URIC ACID: URIC ACID, SERUM: 5 mg/dL (ref 4.0–7.8)

## 2014-12-10 LAB — LIPID PANEL
Cholesterol: 133 mg/dL (ref 0–200)
HDL: 53 mg/dL (ref >=40)
LDL Cholesterol: 68 mg/dL (ref 0–99)
Total CHOL/HDL Ratio: 2.5 Ratio
Triglycerides: 58 mg/dL (ref <150)
VLDL: 12 mg/dL (ref 0–40)

## 2014-12-14 ENCOUNTER — Ambulatory Visit (INDEPENDENT_AMBULATORY_CARE_PROVIDER_SITE_OTHER): Payer: No Typology Code available for payment source | Admitting: Family Medicine

## 2014-12-14 ENCOUNTER — Encounter: Payer: Self-pay | Admitting: Family Medicine

## 2014-12-14 VITALS — BP 102/60 | HR 60 | Temp 98.5°F | Wt 188.5 lb

## 2014-12-14 DIAGNOSIS — E119 Type 2 diabetes mellitus without complications: Secondary | ICD-10-CM

## 2014-12-14 DIAGNOSIS — R0982 Postnasal drip: Secondary | ICD-10-CM

## 2014-12-14 DIAGNOSIS — R7989 Other specified abnormal findings of blood chemistry: Secondary | ICD-10-CM

## 2014-12-14 DIAGNOSIS — R748 Abnormal levels of other serum enzymes: Secondary | ICD-10-CM | POA: Diagnosis not present

## 2014-12-14 DIAGNOSIS — I10 Essential (primary) hypertension: Secondary | ICD-10-CM | POA: Diagnosis not present

## 2014-12-14 LAB — BASIC METABOLIC PANEL
BUN: 34 mg/dL — ABNORMAL HIGH (ref 7–25)
CHLORIDE: 106 meq/L (ref 98–110)
CO2: 25 mEq/L (ref 20–31)
Calcium: 9.5 mg/dL (ref 8.6–10.3)
Creat: 1.53 mg/dL — ABNORMAL HIGH (ref 0.70–1.25)
GLUCOSE: 100 mg/dL — AB (ref 65–99)
Potassium: 4.4 mEq/L (ref 3.5–5.3)
SODIUM: 139 meq/L (ref 135–146)

## 2014-12-14 NOTE — Patient Instructions (Signed)
Go to the lab on the way out.  We'll contact you with your lab report. Taper off the clonidine.  Take once a day for 5 days, then stop.  Update me with your BP a few days after that.  You can then try to taper off the insulin.  If your AM readings don't change much, then stay off it.  Let me consider your throat symptoms in the meantime.  Recheck labs in about 3 months, before a visit.  Take care.  Glad to see you.

## 2014-12-14 NOTE — Progress Notes (Signed)
Pre visit review using our clinic review tool, if applicable. No additional management support is needed unless otherwise documented below in the visit note.  Weight loss noted with gastric surgery hx reviewed.  No abd pain.  Still with ongoing weight loss.    DM2.  Eye exam 12/09/14, normal per patient.  Sugar usually 800-110, on 10 units of insulin.  No ADE on meds.  Prev med taper noted.  No lows, no symptomatic hypoglycemia.    CKD.  Due for recheck BMET, given recent results.  D/w pt. No BLE edema.  Compliant with med.s    BP.  D/w pt about options.  Had been tapering down on clonidine prev.  Still on 0.1 BID.  Low BP noted today at the exam.    Ongoing tickle in the throat.  No heartburn, no cough.  No abd pain.  No fevers.  Asking about options.   PMH and SH reviewed  ROS: See HPI, otherwise noncontributory.  Meds, vitals, and allergies reviewed.   GEN: nad, alert and oriented HEENT: mucous membranes moist NECK: supple w/o LA CV: rrr. PULM: ctab, no inc wob ABD: soft, +bs EXT: no edema SKIN: no acute rash

## 2014-12-16 NOTE — Assessment & Plan Note (Signed)
We didn't address this yet today, but I wonder if this is the cause.  Nasal exam was unremarkable, TMs w/o erythema, OP wnl, mmm.  He may need an antihistamine after he can taper down off the clonidine and potentially off the insulin.

## 2014-12-16 NOTE — Assessment & Plan Note (Signed)
Recheck bmet pending.  D/w pt.  He agrees.

## 2014-12-16 NOTE — Assessment & Plan Note (Signed)
Will try to taper off clonidine, see AVS.  D/w pt.  If HA or elevated BP, then he'll update me.

## 2014-12-16 NOTE — Assessment & Plan Note (Signed)
Based on his A1c, he can likely taper down off the insulin and continue to check his sugar at home.  He'll update me as needed. Unless sig hyperglycemia off insulin, then could continue off med and recheck A1c here later on.  He agrees.

## 2015-02-15 LAB — BASIC METABOLIC PANEL
BUN: 32 mg/dL — AB (ref 4–21)
CREATININE: 1.4 mg/dL — AB (ref <=1.3)
Glucose: 106 mg/dL
Potassium: 4.6 mmol/L (ref 3.4–5.3)

## 2015-02-22 ENCOUNTER — Encounter: Payer: Self-pay | Admitting: Family Medicine

## 2015-03-02 ENCOUNTER — Other Ambulatory Visit: Payer: Self-pay | Admitting: Family Medicine

## 2015-03-04 ENCOUNTER — Other Ambulatory Visit: Payer: Self-pay | Admitting: Family Medicine

## 2015-03-04 DIAGNOSIS — E1122 Type 2 diabetes mellitus with diabetic chronic kidney disease: Secondary | ICD-10-CM

## 2015-03-08 ENCOUNTER — Other Ambulatory Visit (INDEPENDENT_AMBULATORY_CARE_PROVIDER_SITE_OTHER): Payer: No Typology Code available for payment source

## 2015-03-08 DIAGNOSIS — E1122 Type 2 diabetes mellitus with diabetic chronic kidney disease: Secondary | ICD-10-CM

## 2015-03-08 LAB — HEMOGLOBIN A1C
HEMOGLOBIN A1C: 5.7 % — AB (ref <5.7)
MEAN PLASMA GLUCOSE: 117 mg/dL — AB (ref <117)

## 2015-03-08 NOTE — Addendum Note (Signed)
Addended by: Marchia Bond on: 03/08/2015 08:17 AM   Modules accepted: Orders

## 2015-03-15 ENCOUNTER — Ambulatory Visit (INDEPENDENT_AMBULATORY_CARE_PROVIDER_SITE_OTHER): Payer: No Typology Code available for payment source | Admitting: Family Medicine

## 2015-03-15 ENCOUNTER — Encounter: Payer: Self-pay | Admitting: Family Medicine

## 2015-03-15 VITALS — BP 110/68 | HR 64 | Temp 98.9°F | Wt 200.8 lb

## 2015-03-15 DIAGNOSIS — Z119 Encounter for screening for infectious and parasitic diseases, unspecified: Secondary | ICD-10-CM

## 2015-03-15 DIAGNOSIS — Z8639 Personal history of other endocrine, nutritional and metabolic disease: Secondary | ICD-10-CM | POA: Diagnosis not present

## 2015-03-15 MED ORDER — METOPROLOL SUCCINATE ER 50 MG PO TB24: ORAL_TABLET | ORAL | Status: DC

## 2015-03-15 NOTE — Progress Notes (Signed)
Pre visit review using our clinic review tool, if applicable. No additional management support is needed unless otherwise documented below in the visit note.  DM2 f/u.  Weight up some in the meantime, with some inc in intake.  Still down from historic levels.  A1c still <6, still on insulin at this point, 10 units a day.  D/w pt about options.  He is offmult meds in the meantime with weight loss.  Sugar not checked recently.  We talked about diet and exercise.  He feels well.  No bleeding, no abd pain.    No migraines and no gout flares.  Off preventive tx for gout and still doing well.    He'll get the flu shot at the pharmacy.  Pt opts in for HCV screening.  D/w pt re: routine screening.   Opted out of HIV screening.  D/w pt.   Meds, vitals, and allergies reviewed.   ROS: See HPI.  Otherwise, noncontributory.  GEN: nad, alert and oriented HEENT: mucous membranes moist NECK: supple w/o LA CV: rrr PULM: ctab, no inc wob ABD: soft, +bs EXT: no edema

## 2015-03-15 NOTE — Patient Instructions (Addendum)
Stop the insulin and see how your sugars are running.  Recheck A1c in about 3 months.   Take care. Glad to see you.

## 2015-03-15 NOTE — Assessment & Plan Note (Signed)
Likely not Dm2 at this point.  He'll get flu shot at pharmacy.  Recheck A1c in about 3 months.  Continue with diet and exercise.  Stop insulin.   Update me as needed in the meantime.  He agrees.

## 2015-06-14 ENCOUNTER — Other Ambulatory Visit (INDEPENDENT_AMBULATORY_CARE_PROVIDER_SITE_OTHER): Payer: No Typology Code available for payment source

## 2015-06-14 DIAGNOSIS — R7309 Other abnormal glucose: Secondary | ICD-10-CM | POA: Diagnosis not present

## 2015-06-14 DIAGNOSIS — Z119 Encounter for screening for infectious and parasitic diseases, unspecified: Secondary | ICD-10-CM

## 2015-06-15 LAB — HEPATITIS C ANTIBODY: HCV Ab: NEGATIVE

## 2015-06-15 LAB — HEMOGLOBIN A1C
HEMOGLOBIN A1C: 6.2 % — AB (ref <5.7)
Mean Plasma Glucose: 131 mg/dL — ABNORMAL HIGH (ref <117)

## 2015-11-01 IMAGING — CR DG CHEST 2V
2 series · 2 of 2 positions shown · non-contrast
Comparison: 01/20/2013

CLINICAL DATA: Preop gastric bypass surgery.  No chest complaints.

EXAM:
CHEST  2 VIEW

[w chest pa]
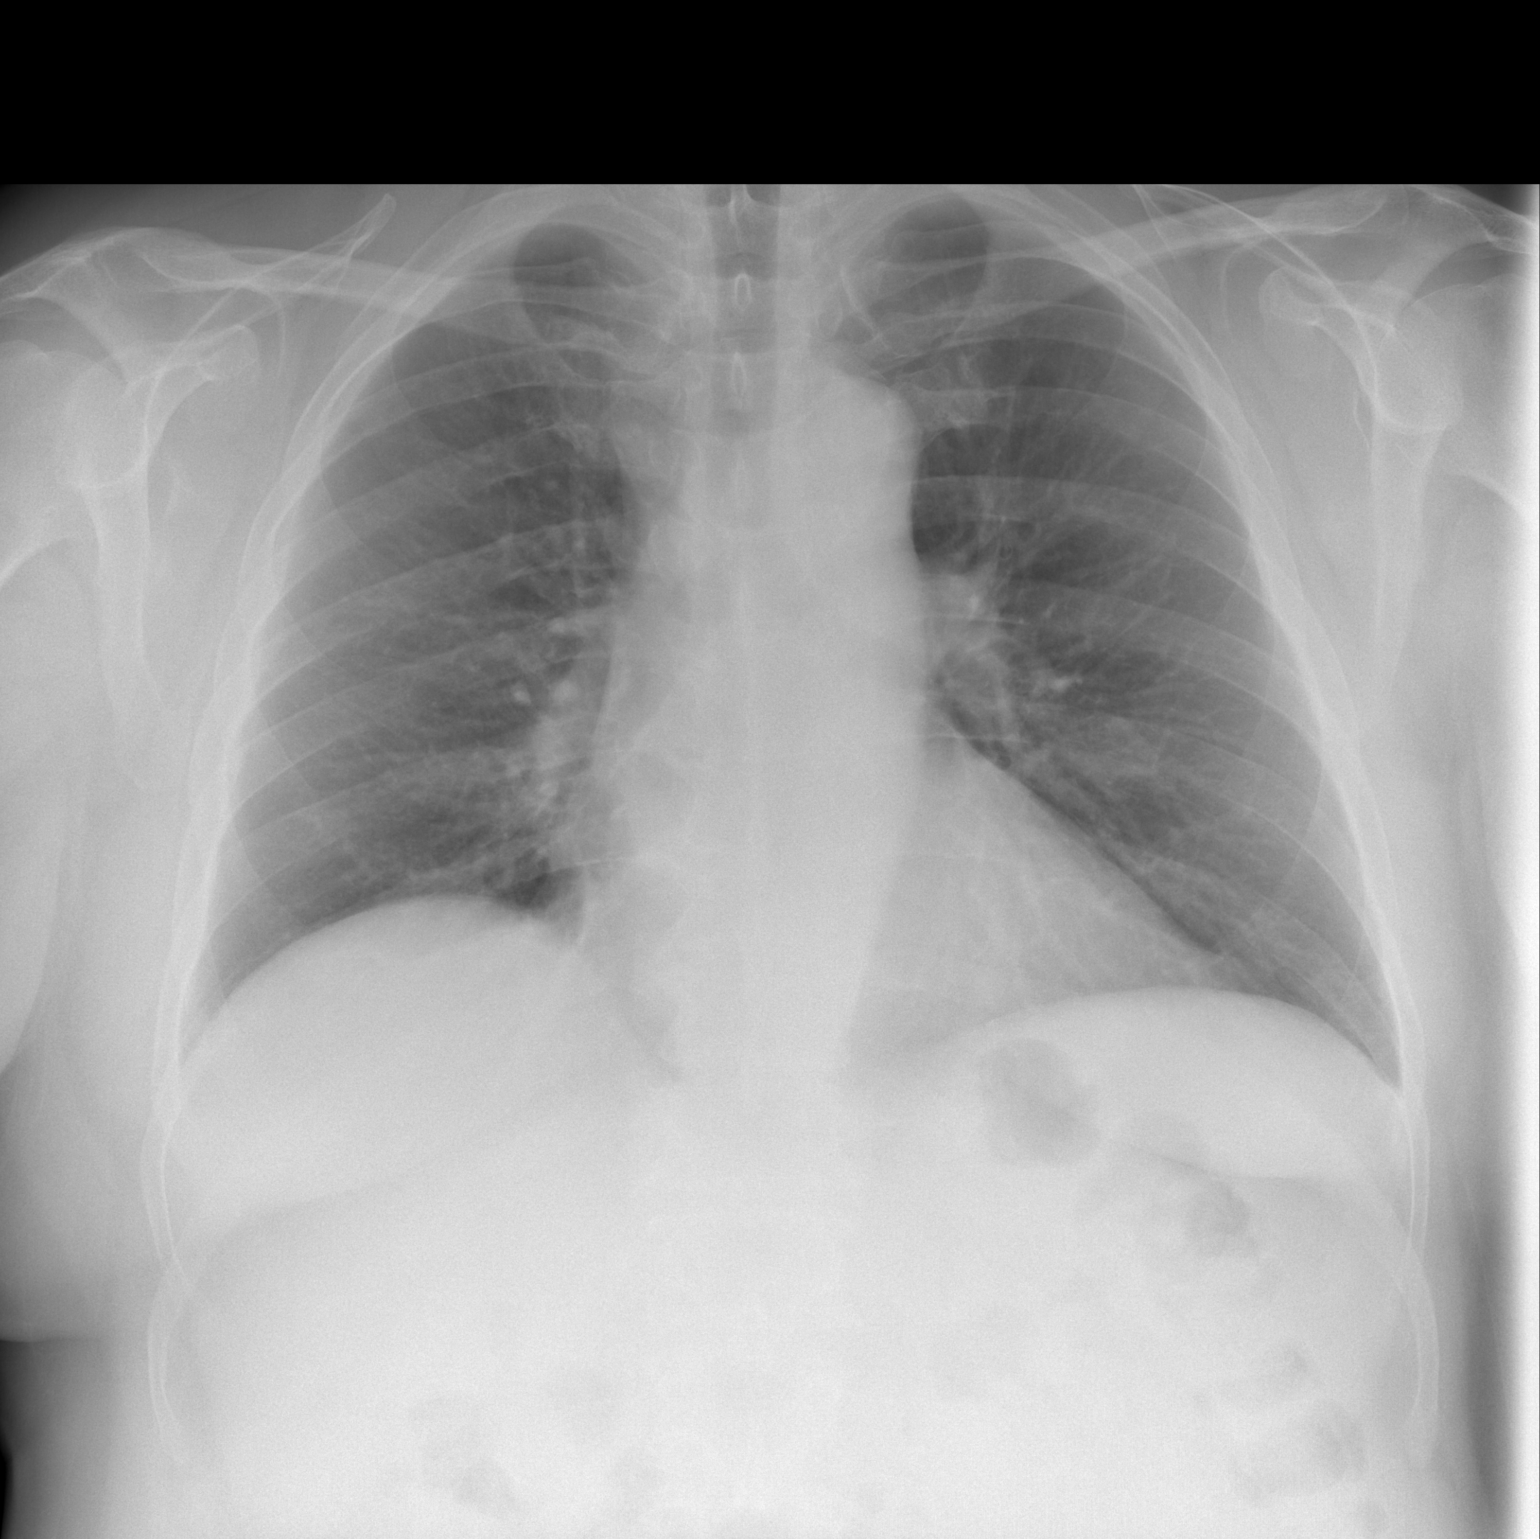

[w chest lat]
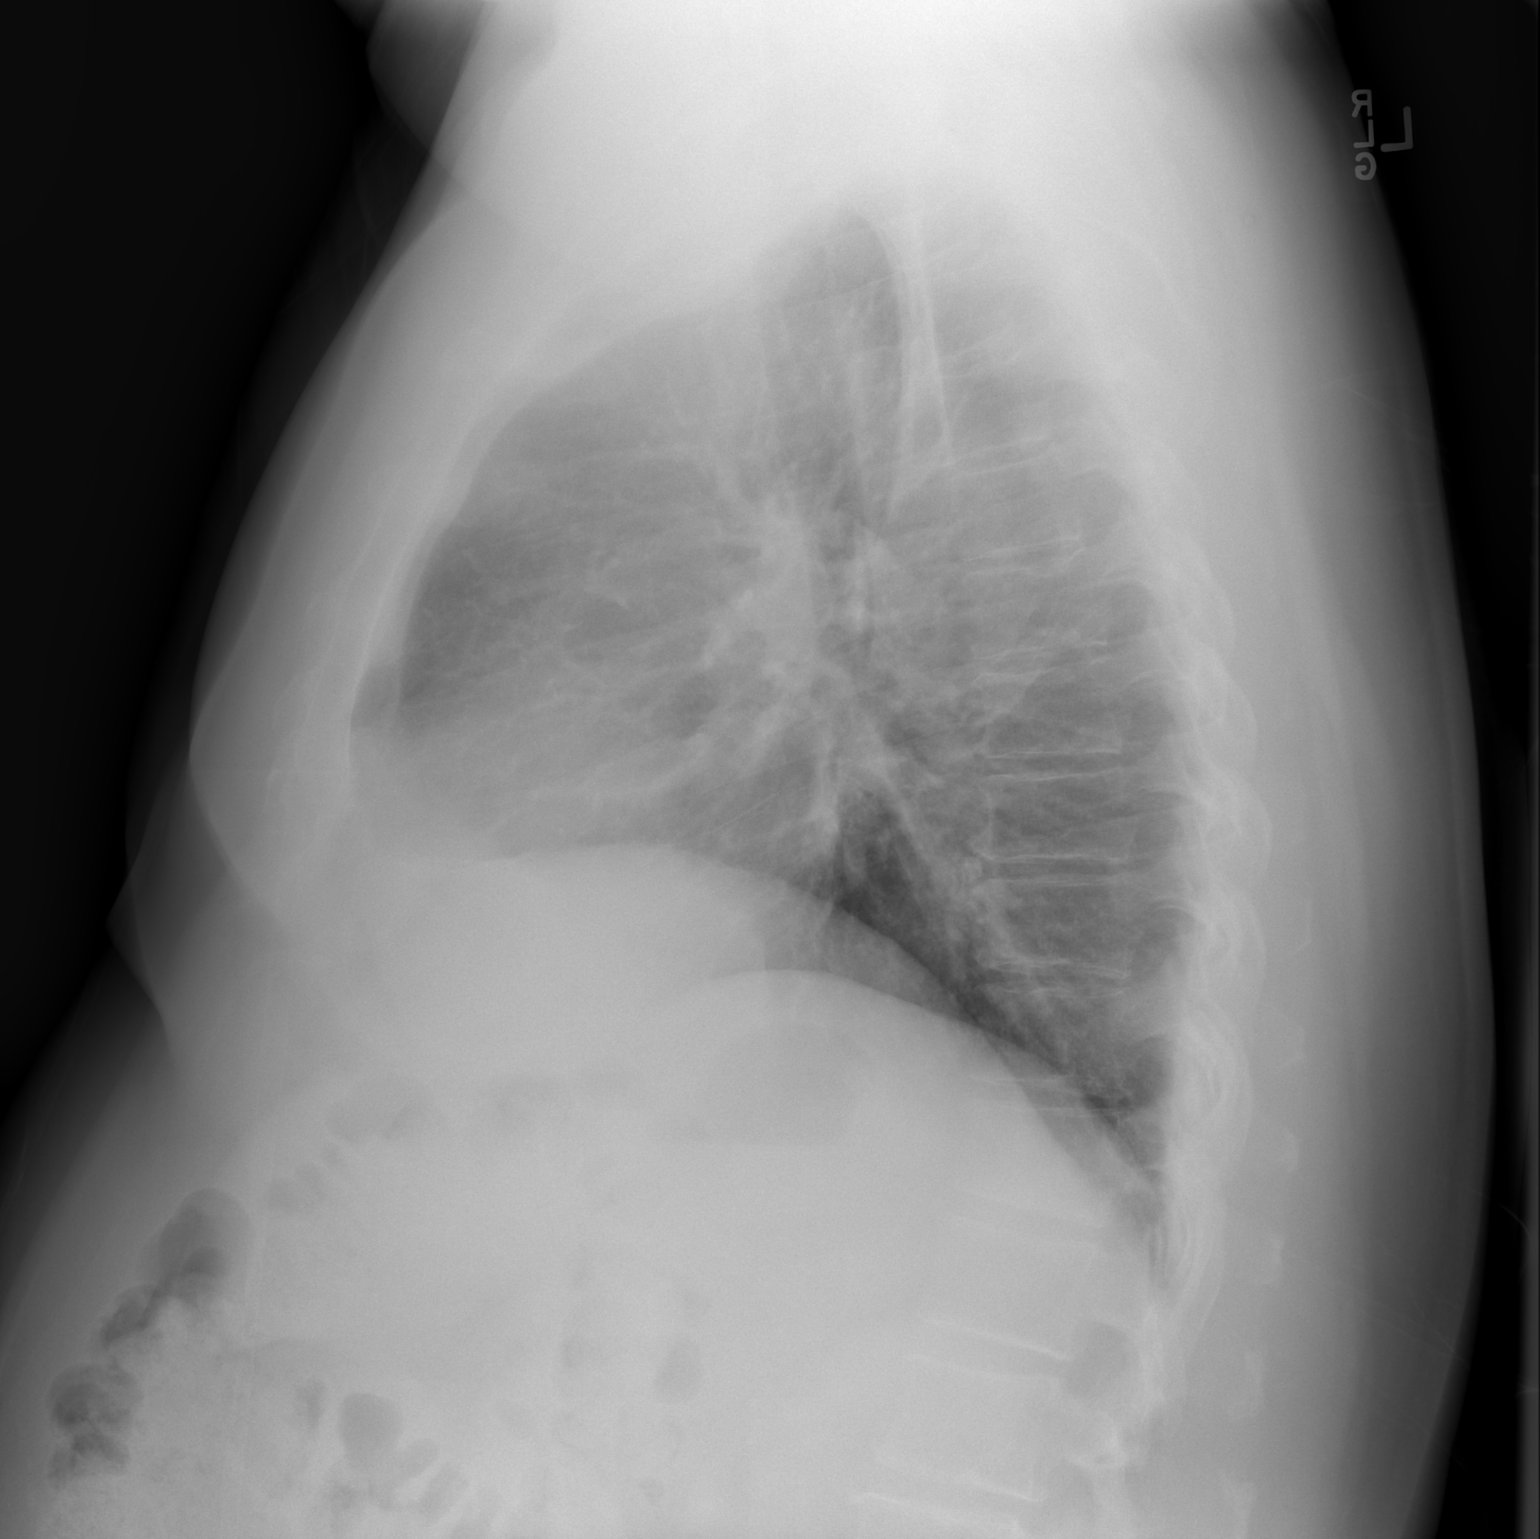

[2 of 2 positions shown; findings below may reference images not displayed]

FINDINGS: The heart size and mediastinal contours are within normal limits.
Both lungs are clear. The visualized skeletal structures are
unremarkable.
IMPRESSION: No active cardiopulmonary disease.

## 2015-12-17 ENCOUNTER — Other Ambulatory Visit: Payer: Self-pay | Admitting: Family Medicine

## 2015-12-18 NOTE — Telephone Encounter (Signed)
Sent.  Due for f/u/CPE.  Thanks.

## 2015-12-19 NOTE — Telephone Encounter (Signed)
Patient advised.  Labs and CPE scheduled.

## 2016-01-11 ENCOUNTER — Other Ambulatory Visit: Payer: Self-pay | Admitting: Family Medicine

## 2016-01-11 ENCOUNTER — Encounter: Payer: Self-pay | Admitting: Family Medicine

## 2016-01-11 ENCOUNTER — Ambulatory Visit (INDEPENDENT_AMBULATORY_CARE_PROVIDER_SITE_OTHER)
Admission: RE | Admit: 2016-01-11 | Discharge: 2016-01-11 | Disposition: A | Discharge status: Home / Self Care | Payer: No Typology Code available for payment source | Source: Ambulatory Visit | Attending: Family Medicine | Admitting: Family Medicine

## 2016-01-11 ENCOUNTER — Ambulatory Visit (INDEPENDENT_AMBULATORY_CARE_PROVIDER_SITE_OTHER): Payer: No Typology Code available for payment source | Admitting: Family Medicine

## 2016-01-11 DIAGNOSIS — R109 Unspecified abdominal pain: Secondary | ICD-10-CM | POA: Insufficient documentation

## 2016-01-11 LAB — CBC WITH DIFFERENTIAL/PLATELET
BASOS PCT: 0.7 % (ref 0.0–3.0)
Basophils Absolute: 0 10*3/uL (ref 0.0–0.1)
EOS PCT: 3 % (ref 0.0–5.0)
Eosinophils Absolute: 0.2 10*3/uL (ref 0.0–0.7)
HEMATOCRIT: 41.5 % (ref 39.0–52.0)
HEMOGLOBIN: 14.4 g/dL (ref 13.0–17.0)
LYMPHS PCT: 26.8 % (ref 12.0–46.0)
Lymphs Abs: 2 10*3/uL (ref 0.7–4.0)
MCHC: 34.8 g/dL (ref 30.0–36.0)
MCV: 91.6 fl (ref 78.0–100.0)
Monocytes Absolute: 0.6 10*3/uL (ref 0.1–1.0)
Monocytes Relative: 8.5 % (ref 3.0–12.0)
NEUTROS ABS: 4.6 10*3/uL (ref 1.4–7.7)
Neutrophils Relative %: 61 % (ref 43.0–77.0)
Platelets: 202 10*3/uL (ref 150.0–400.0)
RBC: 4.53 Mil/uL (ref 4.22–5.81)
RDW: 12.7 % (ref 11.5–15.5)
WBC: 7.5 10*3/uL (ref 4.0–10.5)

## 2016-01-11 LAB — COMPREHENSIVE METABOLIC PANEL
ALBUMIN: 4.1 g/dL (ref 3.5–5.2)
ALT: 11 U/L (ref 0–53)
AST: 13 U/L (ref 0–37)
Alkaline Phosphatase: 122 U/L — ABNORMAL HIGH (ref 39–117)
BUN: 27 mg/dL — ABNORMAL HIGH (ref 6–23)
CALCIUM: 9.3 mg/dL (ref 8.4–10.5)
CHLORIDE: 106 meq/L (ref 96–112)
CO2: 26 mEq/L (ref 19–32)
Creatinine, Ser: 1.98 mg/dL — ABNORMAL HIGH (ref 0.40–1.50)
GFR: 36.59 mL/min — AB (ref >60.00)
Glucose, Bld: 144 mg/dL — ABNORMAL HIGH (ref 70–99)
POTASSIUM: 4.6 meq/L (ref 3.5–5.1)
Sodium: 139 mEq/L (ref 135–145)
Total Bilirubin: 0.7 mg/dL (ref 0.2–1.2)
Total Protein: 7.4 g/dL (ref 6.0–8.3)

## 2016-01-11 LAB — LIPASE: LIPASE: 80 U/L — AB (ref 11.0–59.0)

## 2016-01-11 LAB — H. PYLORI ANTIBODY, IGG: H Pylori IgG: NEGATIVE

## 2016-01-11 NOTE — Progress Notes (Signed)
Subjective:   Patient ID: Luis Joseph, male    DOB: 11-25-1955, 60 y.o.   MRN: [id number]  Luis Joseph is a pleasant 60 y.o. year old male pt of Dr. Damita Dunnings, new to me, who presents to clinic today with Abdominal Pain (x1wk)  on 01/11/2016  HPI:  1 week of severe abdominal pain- progressively getting worse.  Last night was an 8/10.  Could not find a position in bed to make him more comfortable.  Pain is generalized- seems to be mid abdomen but "travels up and down"- pointing to epigastrium and RLQ.  Nothing makes it better.  Position makes it worse.  No known injury.  No recent travel.  No associated nausea, vomiting or diarrhea.  No fever. Has never had a colonoscopy.  Remote h/o gastric bypass.  Current Outpatient Prescriptions on File Prior to Visit  Medication Sig Dispense Refill  . Coenzyme Q10 (COQ-10 PO) Take 300 mg by mouth every morning.     Marland Kitchen desonide (DESOWEN) 0.05 % lotion Apply to affected area twice daily as needed 59 mL 1  . magnesium oxide (MAG-OX) 400 MG tablet Take 400 mg by mouth every morning.    . metoprolol succinate (TOPROL-XL) 100 MG 24 hr tablet TAKE ONE TABLET BY MOUTH ONCE DAILY IMMEDIATELY FOLLOWING A MEAL 90 tablet 1  . Multiple Vitamins-Minerals (MULTIVITAMINS) CHEW Chew by mouth daily.    . niacin 500 MG tablet Take 500 mg by mouth 2 (two) times daily with a meal.      . SUMAtriptan (IMITREX) 100 MG tablet Take 100 mg by mouth every 2 (two) hours as needed for migraine or headache. Luis repeat in 2 hours if headache persists or recurs.    Marland Kitchen zinc gluconate 50 MG tablet Take 50 mg by mouth. In the afternoon.     No current facility-administered medications on file prior to visit.     Allergies  Allergen Reactions  . Codeine Sulfate Hives and Swelling    Past Medical History:  Diagnosis Date  . Allergic rhinitis   . Anemia in chronic kidney disease   . CKD (chronic kidney disease), stage III    NEPHROLOGIST-  DR Lavonia Dana  .  Diabetes mellitus, type 2 ([place])   . GERD (gastroesophageal reflux disease) 05/21/1978  . Gout 05/21/1985  . Hematuria   . History of kidney stones   . Hydronephrosis, right   . Hyperlipidemia 05/21/1988  . Hyperparathyroidism, secondary renal ([place])   . Hypertension 05/21/1978  . Migraines    Better after retirement  . Renal cyst, left     Past Surgical History:  Procedure Laterality Date  . CYSTOSCOPY W/ URETERAL STENT PLACEMENT Right 11/25/2012   Procedure: CYSTOSCOPY WITH RETROGRADE PYELOGRAM/URETERAL STENT PLACEMENT;  Surgeon: Hanley Ben, MD;  Location: [place];  Service: Urology;  Laterality: Right;  . CYSTOSCOPY/RETROGRADE/URETEROSCOPY Right 02/10/2013   Procedure: CYSTOSCOPY/RETROGRADE/URETEROSCOPY Right stent removal  Procedure: Cysto, Right Retrograde, Right Stent Pull, Right Diagnostic Ureteroscopy;  Surgeon: Alexis Frock, MD;  Location: [facility];  Service: Urology;  Laterality: Right;  . GASTRIC ROUX-EN-Y N/A 03/01/2014   Procedure: LAPAROSCOPIC ROUX-EN-Y GASTRIC BYPASS WITH UPPER ENDOSCOPY;  Surgeon: Kaylyn Lim, MD;  Location: [facility];  Service: General;  Laterality: N/A;  . LASIK    . PARTIAL NEPHRECTOMY  12/19/1977   RIGHT PYELOPLASTY AND removal benign cyst and stones    Family History  Problem Relation Age of Onset  . Heart disease Mother   .  Diabetes Mother   . Hypertension Mother   . Cancer Father     Lung  . Diabetes Sister   . Heart disease Maternal Grandfather   . Diabetes Maternal Grandfather   . Stroke Neg Hx   . Prostate cancer Neg Hx   . Colon cancer Neg Hx     Social History   Social History  . Marital status: Married    Spouse name: N/A  . Number of children: 1  . Years of education: N/A   Occupational History  . Retired from [employer], [place] History Main Topics  . Smoking status: Former Smoker    Years: 6.00    Quit date: 11/17/1977  . Smokeless tobacco: Never Used  .  Alcohol use No  . Drug use: No  . Sexual activity: Yes   Other Topics Concern  . Not on file   Social History Narrative   Remarried in 2002, lives with wife.  Enjoys motorcycles.   The PMH, PSH, Social History, Family History, Medications, and allergies have been reviewed in [facility], and have been updated if relevant.   Review of Systems  Constitutional: Negative.   Respiratory: Negative.   Cardiovascular: Negative.   Gastrointestinal: Positive for abdominal pain. Negative for abdominal distention, anal bleeding, blood in stool, constipation, diarrhea, nausea, rectal pain and vomiting.  Genitourinary: Negative.   Musculoskeletal: Negative.   All other systems reviewed and are negative.      Objective:    BP 134/82   Pulse (!) 58   Temp 98.6 F (37 C) (Oral)   Wt 230 lb 12 oz (104.7 kg)   SpO2 96%   BMI 32.18 kg/m    Physical Exam  Constitutional: He is oriented to person, place, and time. He appears well-developed and well-nourished.  HENT:  Head: Normocephalic.  Eyes: Conjunctivae are normal.  Cardiovascular: Normal rate.   Pulmonary/Chest: Effort normal.  Abdominal: Soft. He exhibits no distension and no mass. There is tenderness. There is no rebound and no guarding.  Musculoskeletal: Normal range of motion.  Neurological: He is alert and oriented to person, place, and time. No cranial nerve deficit.  Skin: Skin is warm and dry. He is not diaphoretic.  Psychiatric: He has a normal mood and affect. His behavior is normal. Judgment and thought content normal.  Nursing note and vitals reviewed.         Assessment & Plan:   Abdominal pain, unspecified abdominal location - Plan: CBC with Differential/Platelet, Comprehensive metabolic panel, Lipase, H. pylori antibody, IgG, CT ABDOMEN PELVIS W CONTRAST No Follow-up on file.

## 2016-01-11 NOTE — Patient Instructions (Signed)
Nice to meet you. Please stop by to see Rosaria Ferries or [place] on your way out.

## 2016-01-11 NOTE — Assessment & Plan Note (Signed)
New- unclear etiology at this point. Diffusely tender on exam. Remote h/o gastric bypass and tenderness on exam warrant further evaluation- stat CT of abd/pelvis, CMET, CBC, lipase and h pylori. The patient indicates understanding of these issues and agrees with the plan. Orders Placed This Encounter  Procedures  . CT ABDOMEN PELVIS W CONTRAST  . CBC with Differential/Platelet  . Comprehensive metabolic panel  . Lipase  . H. pylori antibody, IgG

## 2016-01-11 NOTE — Progress Notes (Signed)
Pre visit review using our clinic review tool, if applicable. No additional management support is needed unless otherwise documented below in the visit note. 

## 2016-01-12 ENCOUNTER — Telehealth: Payer: Self-pay | Admitting: *Deleted

## 2016-01-12 NOTE — Telephone Encounter (Signed)
-----   Message from Tonia Ghent, MD sent at 01/12/2016 11:04 AM EDT ----- I may have already sent this to you, I can't remember.  Please check on patient.  Thanks.  Brigitte Pulse

## 2016-01-12 NOTE — Telephone Encounter (Signed)
Pt left v/m; pt continues with abdominal pain; worse at night and early morning; "something is not right". Pt request cb.

## 2016-01-12 NOTE — Telephone Encounter (Signed)
Left detailed message on voicemail to return call with report.

## 2016-01-12 NOTE — Telephone Encounter (Signed)
Did he go to ER?  If not, then I would go.  This isn't at all typical for patient.  Thanks.

## 2016-01-12 NOTE — Telephone Encounter (Signed)
Patient advised.

## 2016-03-19 ENCOUNTER — Other Ambulatory Visit: Payer: Self-pay | Admitting: Family Medicine

## 2016-03-19 DIAGNOSIS — M109 Gout, unspecified: Secondary | ICD-10-CM

## 2016-03-19 DIAGNOSIS — Z8639 Personal history of other endocrine, nutritional and metabolic disease: Secondary | ICD-10-CM

## 2016-03-20 ENCOUNTER — Other Ambulatory Visit: Payer: No Typology Code available for payment source

## 2016-03-22 ENCOUNTER — Other Ambulatory Visit (INDEPENDENT_AMBULATORY_CARE_PROVIDER_SITE_OTHER): Payer: No Typology Code available for payment source

## 2016-03-22 DIAGNOSIS — R7989 Other specified abnormal findings of blood chemistry: Secondary | ICD-10-CM | POA: Diagnosis not present

## 2016-03-22 DIAGNOSIS — M109 Gout, unspecified: Secondary | ICD-10-CM

## 2016-03-22 DIAGNOSIS — Z8639 Personal history of other endocrine, nutritional and metabolic disease: Secondary | ICD-10-CM | POA: Diagnosis not present

## 2016-03-22 LAB — BASIC METABOLIC PANEL
BUN: 30 mg/dL — ABNORMAL HIGH (ref 6–23)
CALCIUM: 9.7 mg/dL (ref 8.4–10.5)
CO2: 25 mEq/L (ref 19–32)
CREATININE: 1.88 mg/dL — AB (ref 0.40–1.50)
Chloride: 105 mEq/L (ref 96–112)
GFR: 38.82 mL/min — AB (ref >60.00)
Glucose, Bld: 158 mg/dL — ABNORMAL HIGH (ref 70–99)
Potassium: 4.4 mEq/L (ref 3.5–5.1)
SODIUM: 137 meq/L (ref 135–145)

## 2016-03-22 LAB — LIPID PANEL
CHOLESTEROL: 210 mg/dL — AB (ref 0–200)
HDL: 45.5 mg/dL (ref >39.00)
NonHDL: 164.11
TRIGLYCERIDES: 288 mg/dL — AB (ref 0.0–149.0)
Total CHOL/HDL Ratio: 5
VLDL: 57.6 mg/dL — ABNORMAL HIGH (ref 0.0–40.0)

## 2016-03-22 LAB — LDL CHOLESTEROL, DIRECT: LDL DIRECT: 127 mg/dL

## 2016-03-22 LAB — HEMOGLOBIN A1C: Hgb A1c MFr Bld: 6.7 % — ABNORMAL HIGH (ref 4.6–6.5)

## 2016-03-22 LAB — URIC ACID: Uric Acid, Serum: 8.5 mg/dL — ABNORMAL HIGH (ref 4.0–7.8)

## 2016-03-27 ENCOUNTER — Encounter: Payer: Self-pay | Admitting: Family Medicine

## 2016-03-27 ENCOUNTER — Ambulatory Visit (INDEPENDENT_AMBULATORY_CARE_PROVIDER_SITE_OTHER): Payer: No Typology Code available for payment source | Admitting: Family Medicine

## 2016-03-27 VITALS — BP 142/90 | HR 62 | Temp 98.1°F | Ht 71.0 in | Wt 236.0 lb

## 2016-03-27 DIAGNOSIS — M109 Gout, unspecified: Secondary | ICD-10-CM

## 2016-03-27 DIAGNOSIS — R7989 Other specified abnormal findings of blood chemistry: Secondary | ICD-10-CM

## 2016-03-27 DIAGNOSIS — G43909 Migraine, unspecified, not intractable, without status migrainosus: Secondary | ICD-10-CM

## 2016-03-27 DIAGNOSIS — Z0000 Encounter for general adult medical examination without abnormal findings: Secondary | ICD-10-CM | POA: Diagnosis not present

## 2016-03-27 DIAGNOSIS — E119 Type 2 diabetes mellitus without complications: Secondary | ICD-10-CM

## 2016-03-27 DIAGNOSIS — Z1211 Encounter for screening for malignant neoplasm of colon: Secondary | ICD-10-CM

## 2016-03-27 MED ORDER — ALLOPURINOL 300 MG PO TABS: 300.0000 mg | ORAL_TABLET | Freq: Every day | ORAL | Status: DC

## 2016-03-27 MED ORDER — METOPROLOL SUCCINATE ER 100 MG PO TB24: ORAL_TABLET | ORAL | 3 refills | Status: DC

## 2016-03-27 NOTE — Patient Instructions (Addendum)
Check with your insurance to see if they will cover the shingles shot. I would get a flu shot each fall.   Recheck labs in about 3 months before a visit.  Call about an eye exam.  Go to the lab on the way out.  We'll contact you with your lab report. Take care.  Glad to see you.

## 2016-03-27 NOTE — Progress Notes (Signed)
Pre visit review using our clinic review tool, if applicable. No additional management support is needed unless otherwise documented below in the visit note. 

## 2016-03-27 NOTE — Progress Notes (Signed)
CPE- See plan.  Routine anticipatory guidance given to patient.  See health maintenance. Tetanus 2013 PNA 2014 Flu to be done at pharmacy, d/w pt Shingles d/w pt.   D/w patient [id number] for colon cancer screening, including IFOB vs. colonoscopy.  Risks and benefits of both were discussed and patient voiced understanding.  Pt elects for: IFOB.   Prostate cancer screening and PSA options (with potential risks and benefits of testing vs not testing) were discussed along with recent recs/guidelines.  He declined testing PSA at this point. Living will d/w pt.  Encouraged.  Wife designated if patient were incapacitated, then his daughter Luis Joseph if his wife were unavailable.   Diet and exercise and weight d/w pt.  "I'm eating the wrong stuff, not too much but the wrong stuff."   DM2 by A1c.  D/w pt.  No meds.  Weight up.  Lipids up.  D/w pt. Eye exam d/w pt, encouraged.    Gout hx.  No recent flares.  Not needing colchicine recently.  Uric acid up.  D/w pt.   Prev abd pain resolved.    CKD.  Has seen renal.  Labs d/w pt.    Migraines.  No recent sx.    PMH and SH reviewed  Meds, vitals, and allergies reviewed.   ROS: Per HPI.  Unless specifically indicated otherwise in HPI, the patient denies:  General: fever. Eyes: acute vision changes ENT: sore throat Cardiovascular: chest pain Respiratory: SOB GI: vomiting GU: dysuria Musculoskeletal: acute back pain Derm: acute rash Neuro: acute motor dysfunction Psych: worsening mood Endocrine: polydipsia Heme: bleeding Allergy: hayfever  GEN: nad, alert and oriented HEENT: mucous membranes moist NECK: supple w/o LA CV: rrr. PULM: ctab, no inc wob ABD: soft, +bs EXT: no edema SKIN: no acute rash  Diabetic foot exam: Normal inspection No skin breakdown No calluses  Normal DP pulses Normal sensation to light touch and monofilament Nails normal

## 2016-03-28 NOTE — Assessment & Plan Note (Signed)
No symptoms since his gastric bypass. Discussed with patient about weight loss with diet and exercise.

## 2016-03-28 NOTE — Assessment & Plan Note (Signed)
Tetanus 2013 PNA 2014 Flu to be done at pharmacy, d/w pt Shingles d/w pt.   D/w patient [id number] for colon cancer screening, including IFOB vs. colonoscopy.  Risks and benefits of both were discussed and patient voiced understanding.  Pt elects for: IFOB.   Prostate cancer screening and PSA options (with potential risks and benefits of testing vs not testing) were discussed along with recent recs/guidelines.  He declined testing PSA at this point. Living will d/w pt.  Encouraged.  Wife designated if patient were incapacitated, then his daughter [name] if his wife were unavailable.   Diet and exercise and weight d/w pt.  "I'm eating the wrong stuff, not too much but the wrong stuff."

## 2016-03-28 NOTE — Assessment & Plan Note (Signed)
Cr slightly higher than previous but not at his maximum creatinine compared to historic levels. Would not change medications at this point. Continue work on diet and weight. We can recheck periodically. He also has follow-up with the renal clinic scheduled routinely. All discussed with patient.

## 2016-03-28 NOTE — Assessment & Plan Note (Signed)
Has been restarted on allopurinol. Continue work on weight as his uric acid is increased.

## 2016-03-28 NOTE — Assessment & Plan Note (Signed)
Again diabetic by A1c. Weight gain noted. Discussed with patient about diet, exercise, weight. He is going to work on diet and exercise and recheck in a few months. He does not need medicine for diabetes at this point, given his A1c. He agrees.

## 2016-03-30 ENCOUNTER — Other Ambulatory Visit: Payer: Self-pay | Admitting: Family Medicine

## 2016-04-02 NOTE — Telephone Encounter (Signed)
Received refill electronically Last refill 05/04/14   59 ml/1 refill Last office visit 03/27/16

## 2016-04-03 NOTE — Telephone Encounter (Signed)
Sent. Thanks.   

## 2016-04-19 ENCOUNTER — Other Ambulatory Visit: Payer: Self-pay | Admitting: Family Medicine

## 2016-04-19 NOTE — Telephone Encounter (Signed)
Electronic refill request. Looks like there was a "no Print" on 03/27/16  Please advise.

## 2016-04-20 NOTE — Telephone Encounter (Signed)
Sent. Thanks.   

## 2016-06-01 ENCOUNTER — Other Ambulatory Visit: Payer: Self-pay | Admitting: Family Medicine

## 2016-09-11 LAB — PTH, INTACT
BUN: 32 mg/dL — AB (ref 4–21)
Creatinine, Ser: 1.98
GLUCOSE: 153
PTH: 19 pg/mL

## 2016-09-27 ENCOUNTER — Encounter: Payer: Self-pay | Admitting: Family Medicine

## 2016-10-04 ENCOUNTER — Encounter (HOSPITAL_COMMUNITY): Payer: Self-pay

## 2016-10-09 ENCOUNTER — Ambulatory Visit (INDEPENDENT_AMBULATORY_CARE_PROVIDER_SITE_OTHER): Payer: No Typology Code available for payment source | Admitting: Family Medicine

## 2016-10-09 ENCOUNTER — Encounter: Payer: Self-pay | Admitting: Family Medicine

## 2016-10-09 ENCOUNTER — Ambulatory Visit: Payer: No Typology Code available for payment source | Admitting: Family Medicine

## 2016-10-09 VITALS — BP 142/90 | HR 58 | Temp 98.5°F | Wt 234.2 lb

## 2016-10-09 DIAGNOSIS — R0789 Other chest pain: Secondary | ICD-10-CM | POA: Diagnosis not present

## 2016-10-09 DIAGNOSIS — R109 Unspecified abdominal pain: Secondary | ICD-10-CM

## 2016-10-09 DIAGNOSIS — R42 Dizziness and giddiness: Secondary | ICD-10-CM | POA: Diagnosis not present

## 2016-10-09 LAB — CBC WITH DIFFERENTIAL/PLATELET
BASOS ABS: 0.1 10*3/uL (ref 0.0–0.1)
BASOS PCT: 1 % (ref 0.0–3.0)
Eosinophils Absolute: 0.2 10*3/uL (ref 0.0–0.7)
Eosinophils Relative: 3.5 % (ref 0.0–5.0)
HEMATOCRIT: 40 % (ref 39.0–52.0)
Hemoglobin: 13.5 g/dL (ref 13.0–17.0)
LYMPHS ABS: 2.1 10*3/uL (ref 0.7–4.0)
Lymphocytes Relative: 31.9 % (ref 12.0–46.0)
MCHC: 33.6 g/dL (ref 30.0–36.0)
MCV: 97 fl (ref 78.0–100.0)
MONOS PCT: 8.5 % (ref 3.0–12.0)
Monocytes Absolute: 0.6 10*3/uL (ref 0.1–1.0)
NEUTROS PCT: 55.1 % (ref 43.0–77.0)
Neutro Abs: 3.7 10*3/uL (ref 1.4–7.7)
PLATELETS: 204 10*3/uL (ref 150.0–400.0)
RBC: 4.13 Mil/uL — ABNORMAL LOW (ref 4.22–5.81)
RDW: 14 % (ref 11.5–15.5)
WBC: 6.7 10*3/uL (ref 4.0–10.5)

## 2016-10-09 LAB — COMPREHENSIVE METABOLIC PANEL
ALT: 9 U/L (ref 0–53)
AST: 13 U/L (ref 0–37)
Albumin: 4 g/dL (ref 3.5–5.2)
Alkaline Phosphatase: 122 U/L — ABNORMAL HIGH (ref 39–117)
BILIRUBIN TOTAL: 0.6 mg/dL (ref 0.2–1.2)
BUN: 24 mg/dL — ABNORMAL HIGH (ref 6–23)
CHLORIDE: 106 meq/L (ref 96–112)
CO2: 27 mEq/L (ref 19–32)
Calcium: 9.6 mg/dL (ref 8.4–10.5)
Creatinine, Ser: 1.92 mg/dL — ABNORMAL HIGH (ref 0.40–1.50)
GFR: 37.82 mL/min — AB (ref >60.00)
GLUCOSE: 141 mg/dL — AB (ref 70–99)
Potassium: 4.5 mEq/L (ref 3.5–5.1)
Sodium: 138 mEq/L (ref 135–145)
Total Protein: 7.2 g/dL (ref 6.0–8.3)

## 2016-10-09 LAB — TSH: TSH: 1.59 u[IU]/mL (ref 0.35–4.50)

## 2016-10-09 LAB — LIPASE: Lipase: 129 U/L — ABNORMAL HIGH (ref 11.0–59.0)

## 2016-10-09 NOTE — Patient Instructions (Addendum)
Luis Joseph will call about your referral. Use nasal saline several times a day in the meantime.   Taper off mountain dew slowly.  We'll contact you with your lab report. Take care.  Glad to see you.  If you have more chest pain that doesn't resolve with rest then go to the ER.

## 2016-10-09 NOTE — Progress Notes (Signed)
Some abd pain, slightly to the L and R of the midline in the mid abd.  3/10 at its worst, intermittent pain, superior to the umbilicus.  Going on for about 1 week.  This doesn't correspond to the sx below.  Can wake up with sx. No diarrhea.  No blood in stool.  No fevers.   Not worse with eating.   Room spinning.  Intermittent.  A lot of upper tooth pain and post nasal gtt.  No fevers.  No vomiting except for nearly vomiting from coughing.  Sx going on for about 1 month.  Had been mucinex in the meantime. Can last a few seconds or a few minutes.  Can happen with or without head movement.  Can happen with rolling over in the bed.  Generally is less severe when supine.  No syncope/presyncope.  No tinnitus.  He is in/out of coolers at work.  Unclear how much the temperature change affects him.    Episodic chest discomfort that isn't severe.  Going on a few weeks.  Can happen with walking.  Only with walking.  Has happened about 3-4 times. Gets better after stopping walking.  He hasn't been exercising o/w.  No h/o CAD.    He has been drinking mtn dew.  D/w pt about taper.    PMH and SH reviewed  ROS: Per HPI unless specifically indicated in ROS section   Meds, vitals, and allergies reviewed.   GEN: nad, alert and oriented HEENT: mucous membranes moist, tm w/o erythema, nasal exam w/o erythema, clear discharge noted,  OP with minimal cobblestoning, sinuses not ttp x4 NECK: supple w/o LA CV: rrr.   PULM: ctab, no inc wob ABD soft, not ttp, normal BS EXT: no edema SKIN: no acute rash No vertigo sx with head turning or eye tracking on testing but had some sx at rest in the chair earlier in interview, that self resolved.  EKG reviewed and d/w pt.  See EMR results.

## 2016-10-10 ENCOUNTER — Encounter: Payer: Self-pay | Admitting: Family Medicine

## 2016-10-10 DIAGNOSIS — R0789 Other chest pain: Secondary | ICD-10-CM | POA: Insufficient documentation

## 2016-10-10 NOTE — Assessment & Plan Note (Signed)
He has a history of multiple metabolic abnormalities, including diabetes, previously improved with gastric bypass. He does have exertional symptoms. No symptoms at rest. No symptoms now. No known history of coronary artery disease. EKG is unremarkable. No acute changes. I would like him see cardiology. No exertion in the meantime. Discussed with patient. Routine cautions given. Refer. He agrees. See after visit summary.

## 2016-10-10 NOTE — Assessment & Plan Note (Signed)
He has a history of gastric bypass. This appears to be potentially separate from the other issues he is having. Abdominal exam is benign. Would check routine labs today. See notes on labs. Still okay for outpatient follow-up.

## 2016-10-10 NOTE — Assessment & Plan Note (Signed)
He does not have symptoms that are typical for BPV. His sinuses are not tender. He is stuffy.  He has had some postnasal drip. He does not have sinus tenderness that would be typical of a sinus infection. Nontoxic. He has normal eye tracking without a cranial nerve deficit and his strength and sensation is grossly intact in the extremities.  Given the chest pain symptoms I am hesitant to treat him aggressively for vertigo. Discussed with patient. Would use nasal saline in the meantime for the congestion and see how much that affects his symptoms. He agrees.

## 2016-11-08 ENCOUNTER — Emergency Department (HOSPITAL_COMMUNITY): Payer: No Typology Code available for payment source

## 2016-11-08 ENCOUNTER — Emergency Department (HOSPITAL_COMMUNITY)
Admission: EM | Admit: 2016-11-08 | Discharge: 2016-11-08 | Disposition: A | Discharge status: Home / Self Care | Payer: No Typology Code available for payment source | Attending: Emergency Medicine | Admitting: Emergency Medicine

## 2016-11-08 ENCOUNTER — Encounter (INDEPENDENT_AMBULATORY_CARE_PROVIDER_SITE_OTHER): Payer: Self-pay | Admitting: Orthopaedic Surgery

## 2016-11-08 ENCOUNTER — Encounter (HOSPITAL_COMMUNITY): Payer: Self-pay | Admitting: Neurology

## 2016-11-08 DIAGNOSIS — Y9301 Activity, walking, marching and hiking: Secondary | ICD-10-CM | POA: Insufficient documentation

## 2016-11-08 DIAGNOSIS — S82892A Other fracture of left lower leg, initial encounter for closed fracture: Secondary | ICD-10-CM

## 2016-11-08 DIAGNOSIS — Y929 Unspecified place or not applicable: Secondary | ICD-10-CM | POA: Insufficient documentation

## 2016-11-08 DIAGNOSIS — Q899 Congenital malformation, unspecified: Secondary | ICD-10-CM

## 2016-11-08 DIAGNOSIS — W010XXA Fall on same level from slipping, tripping and stumbling without subsequent striking against object, initial encounter: Secondary | ICD-10-CM | POA: Insufficient documentation

## 2016-11-08 DIAGNOSIS — Z87891 Personal history of nicotine dependence: Secondary | ICD-10-CM | POA: Insufficient documentation

## 2016-11-08 DIAGNOSIS — I129 Hypertensive chronic kidney disease with stage 1 through stage 4 chronic kidney disease, or unspecified chronic kidney disease: Secondary | ICD-10-CM | POA: Insufficient documentation

## 2016-11-08 DIAGNOSIS — S99912A Unspecified injury of left ankle, initial encounter: Secondary | ICD-10-CM | POA: Diagnosis present

## 2016-11-08 DIAGNOSIS — E119 Type 2 diabetes mellitus without complications: Secondary | ICD-10-CM | POA: Insufficient documentation

## 2016-11-08 DIAGNOSIS — M84372A Stress fracture, left ankle, initial encounter for fracture: Secondary | ICD-10-CM | POA: Insufficient documentation

## 2016-11-08 DIAGNOSIS — Z79899 Other long term (current) drug therapy: Secondary | ICD-10-CM | POA: Insufficient documentation

## 2016-11-08 DIAGNOSIS — N183 Chronic kidney disease, stage 3 (moderate): Secondary | ICD-10-CM | POA: Insufficient documentation

## 2016-11-08 DIAGNOSIS — S82852A Displaced trimalleolar fracture of left lower leg, initial encounter for closed fracture: Secondary | ICD-10-CM | POA: Diagnosis not present

## 2016-11-08 DIAGNOSIS — Y999 Unspecified external cause status: Secondary | ICD-10-CM | POA: Insufficient documentation

## 2016-11-08 MED ORDER — FENTANYL CITRATE (PF) 100 MCG/2ML IJ SOLN
100.0000 ug | Freq: Once | INTRAMUSCULAR | Status: AC
  Administered 2016-11-08: 100 ug via INTRAVENOUS
  Filled 2016-11-08: qty 2

## 2016-11-08 MED ORDER — BUPIVACAINE HCL 0.5 % IJ SOLN
50.0000 mL | Freq: Once | INTRAMUSCULAR | Status: AC
  Administered 2016-11-08: 50 mL
  Filled 2016-11-08: qty 50

## 2016-11-08 MED ORDER — OXYCODONE-ACETAMINOPHEN 5-325 MG PO TABS: 1.0000 | ORAL_TABLET | ORAL | 0 refills | Status: DC | PRN

## 2016-11-08 MED ORDER — HYDROMORPHONE HCL 1 MG/ML IJ SOLN
1.0000 mg | Freq: Once | INTRAMUSCULAR | Status: AC
  Administered 2016-11-08: 1 mg via INTRAVENOUS
  Filled 2016-11-08: qty 1

## 2016-11-08 MED ORDER — FENTANYL CITRATE (PF) 100 MCG/2ML IJ SOLN
INTRAMUSCULAR | Status: AC
  Filled 2016-11-08: qty 2

## 2016-11-08 MED ORDER — MIDAZOLAM HCL 2 MG/2ML IJ SOLN
2.0000 mg | Freq: Once | INTRAMUSCULAR | Status: AC
  Administered 2016-11-08: 2 mg via INTRAVENOUS

## 2016-11-08 MED ORDER — LIDOCAINE HCL 2 % IJ SOLN
10.0000 mL | Freq: Once | INTRAMUSCULAR | Status: AC
  Administered 2016-11-08: 200 mg via INTRADERMAL
  Filled 2016-11-08: qty 20

## 2016-11-08 MED ORDER — FENTANYL CITRATE (PF) 100 MCG/2ML IJ SOLN
100.0000 ug | Freq: Once | INTRAMUSCULAR | Status: AC
  Administered 2016-11-08: 50 ug via INTRAVENOUS

## 2016-11-08 MED ORDER — IBUPROFEN 600 MG PO TABS: 600.0000 mg | ORAL_TABLET | Freq: Four times a day (QID) | ORAL | 0 refills | Status: DC | PRN

## 2016-11-08 MED ORDER — MIDAZOLAM HCL 2 MG/2ML IJ SOLN
INTRAMUSCULAR | Status: AC
  Filled 2016-11-08: qty 2

## 2016-11-08 NOTE — ED Provider Notes (Signed)
Medical screening examination/treatment/procedure(s) were conducted as a shared visit with non-physician practitioner(s) and myself.  I personally evaluated the patient during the encounter.   EKG Interpretation None     Patient slipped on wet trailer bed. This caused his left leg  go out and twisted ankle. He denies any other associated injury. On examination patient has external rotation deformity. Distal pulses 2+. Foot is warm and dry. He is neurovascularly intact. X-ray shows bimalleolar fracture. Orthopedics consult. I agree with plan of management.   Charlesetta Shanks, MD 11/08/16 1224

## 2016-11-08 NOTE — Discharge Instructions (Addendum)
Do not put weight down on left leg. Keep splint intact and dry. Keep leg elevated whenever possible. Ice over splint 30 minutes 4 times a day.

## 2016-11-08 NOTE — Progress Notes (Signed)
Orthopedic Tech Progress Note Patient Details:  Luis Joseph Mar 14, 1956 [id number]  Ortho Devices Type of Ortho Device: Ace wrap, Post (short leg) splint, Stirrup splint Ortho Device/Splint Interventions: Application   [place] Pink 11/08/2016, 2:36 PM

## 2016-11-08 NOTE — ED Notes (Signed)
Called ortho tech to apply short leg splint

## 2016-11-08 NOTE — ED Triage Notes (Signed)
Per ems- pt was walking off trailer that he pulls behind his truck for his mower. He tripped and twisted his left ankle. Felt a pop. EMS arrived reported he had syncopal episode lasting several seconds with loss of radials. BP 90 systolic. Given 500 NS. BP improved to 120/70, given 100 fentanyl. Is a x 4. Deformity to left ankle, sensation intact, pulse present.

## 2016-11-08 NOTE — ED Provider Notes (Signed)
[facility] DEPT Provider Note   CSN: [id number] Arrival date & time: 11/08/16  1043     History   Chief Complaint Chief Complaint  Patient presents with  . Ankle Pain    HPI Luis Joseph is a 61 y.o. male.  HPI   Patient is a 61 year old male with history of diabetes, hypertension, hyperlipidemia, CKD who presents to the ED with complaint of left ankle injury, onset prior to arrival. Patient reports he was walking off his trailer and states he tripped resulting in falling and twisting his left ankle. Denies head injury or LOC. Patient reports hearing a pop. EMS report patient had a brief syncopal episode lasting a few seconds while he was on the stretcher. Patient denies any recollection of this event. EMS administered 589mL NS and 100 mg fentanyl prior to arrival. Patient reports mild improvement of pain. Denies any prior injury or surgeries to his left lower extremity. Denies numbness, tingling, knee pain, headache, lightheadedness, chest pain, shortness of breath. Tetanus UTD.  Past Medical History:  Diagnosis Date  . Allergic rhinitis   . Anemia in chronic kidney disease   . CKD (chronic kidney disease), stage III    NEPHROLOGIST-  DR Lavonia Dana  . Diabetes mellitus, type 2 ([place])   . GERD (gastroesophageal reflux disease) 05/21/1978  . Gout 05/21/1985  . Hematuria   . History of kidney stones   . Hydronephrosis, right   . Hyperlipidemia 05/21/1988  . Hyperparathyroidism, secondary renal ([place])   . Hypertension 05/21/1978  . Migraines    Better after retirement  . Renal cyst, left     Patient Active Problem List   Diagnosis Date Noted  . Chest discomfort 10/10/2016  . Abdominal pain 01/11/2016  . 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 Abnormal x-ray 10/20/2013  . Paresthesia 10/20/2013  . Renal stone 04/24/2013  . Routine general medical examination at a health care facility 09/29/2012  . Elevated serum creatinine 09/29/2012  .  Migraine headache 08/04/2012  . Erectile dysfunction 07/25/2011  . Postnasal drip 07/25/2011  . Muscle cramp, nocturnal 07/25/2011  . Vertigo 07/25/2011  . FATIGUE 06/06/2010  . Diabetes mellitus without complication ([place]) 58/52/7782  . SKIN LESION 03/17/2010  . DERMATITIS 10/20/2009  . ANXIETY 06/30/2008  . UNSPECIFIED SLEEP DISTURBANCE 06/10/2008  . OBESITY, UNSPECIFIED 04/21/2007  . HLD (hyperlipidemia) 05/21/1988  . Gout 05/21/1985  . Essential hypertension 05/21/1978  . GERD 05/21/1978  . HEADACHE, CLUSTER 05/21/1976    Past Surgical History:  Procedure Laterality Date  . CYSTOSCOPY W/ URETERAL STENT PLACEMENT Right 11/25/2012   Procedure: CYSTOSCOPY WITH RETROGRADE PYELOGRAM/URETERAL STENT PLACEMENT;  Surgeon: Hanley Ben, MD;  Location: [facility];  Service: Urology;  Laterality: Right;  . CYSTOSCOPY/RETROGRADE/URETEROSCOPY Right 02/10/2013   Procedure: CYSTOSCOPY/RETROGRADE/URETEROSCOPY Right stent removal  Procedure: Cysto, Right Retrograde, Right Stent Pull, Right Diagnostic Ureteroscopy;  Surgeon: Alexis Frock, MD;  Location: [facility];  Service: Urology;  Laterality: Right;  . GASTRIC ROUX-EN-Y N/A 03/01/2014   Procedure: LAPAROSCOPIC ROUX-EN-Y GASTRIC BYPASS WITH UPPER ENDOSCOPY;  Surgeon: Kaylyn Lim, MD;  Location: [facility];  Service: General;  Laterality: N/A;  . LASIK    . PARTIAL NEPHRECTOMY  12/19/1977   RIGHT PYELOPLASTY AND removal benign cyst and stones       Home Medications    Prior to Admission medications   Medication Sig Start Date End Date Taking? Authorizing Provider  allopurinol (ZYLOPRIM) 300 MG tablet Take 1 tablet (300 mg total)  by mouth daily. 03/27/16  Yes Tonia Ghent, MD  Hypromellose (ARTIFICIAL TEARS OP) Place 1 drop into both eyes daily as needed (dry eyes).   Yes [provider]  losartan (COZAAR) 50 MG tablet Take 50 mg by mouth daily. 10/24/16  Yes [provider]  metoprolol  succinate (TOPROL-XL) 100 MG 24 hr tablet TAKE ONE TABLET BY MOUTH ONCE DAILY IMMEDIATELY FOLLOWING A MEAL 06/01/16  Yes Tonia Ghent, MD  desonide (DESOWEN) 0.05 % lotion APPLY TO AFFECTED AREA  TWICE A DAY Patient not taking: Reported on 11/08/2016 04/03/16   Tonia Ghent, MD  ibuprofen (ADVIL,MOTRIN) 600 MG tablet Take 1 tablet (600 mg total) by mouth every 6 (six) hours as needed. 11/08/16   Nona Dell, PA-C  oxyCODONE-acetaminophen (PERCOCET/ROXICET) 5-325 MG tablet Take 1 tablet by mouth every 4 (four) hours as needed for severe pain. 11/08/16   Nona Dell, PA-C    Family History Family History  Problem Relation Age of Onset  . Heart disease Mother   . Diabetes Mother   . Hypertension Mother   . Cancer Father        Lung  . Diabetes Sister   . Heart disease Maternal Grandfather   . Diabetes Maternal Grandfather   . Stroke Neg Hx   . Prostate cancer Neg Hx   . Colon cancer Neg Hx     Social History Social History  Substance Use Topics  . Smoking status: Former Smoker    Years: 6.00    Quit date: 11/17/1977  . Smokeless tobacco: Never Used  . Alcohol use No     Allergies   Codeine sulfate and Sildenafil citrate   Review of Systems Review of Systems  Musculoskeletal: Positive for arthralgias (left ankle) and joint swelling.  All other systems reviewed and are negative.    Physical Exam Updated Vital Signs BP (!) 144/95   Pulse 73   Temp 97.4 F (36.3 C) (Oral)   Resp 11   Ht 5\' 11"  (1.803 m)   Wt 106.1 kg (234 lb)   SpO2 (!) 85%   BMI 32.64 kg/m   Physical Exam  Constitutional: He is oriented to person, place, and time. He appears well-developed and well-nourished.  HENT:  Head: Normocephalic and atraumatic.  Eyes: Conjunctivae and EOM are normal. Pupils are equal, round, and reactive to light. Right eye exhibits no discharge. Left eye exhibits no discharge. No scleral icterus.  Neck: Normal range of motion. Neck supple.   Cardiovascular: Normal rate, regular rhythm, normal heart sounds and intact distal pulses.   Pulmonary/Chest: Effort normal and breath sounds normal. No respiratory distress. He has no wheezes. He has no rales. He exhibits no tenderness.  Abdominal: Soft. He exhibits no distension. There is no tenderness.  Musculoskeletal: He exhibits tenderness and deformity (left ankle). He exhibits no edema.       Left knee: Normal.       Left ankle: He exhibits decreased range of motion, swelling and deformity. He exhibits no laceration and normal pulse. Tenderness. Medial malleolus tenderness found. No proximal fibula tenderness found. Achilles tendon normal.       Left lower leg: Normal.       Left foot: Normal.  Moderate swelling, TTP and deformity present to left ankle at medial malleolus. Dec ROM of left ankle. FROM of left toes and knee. 2+ DP pulse. Sensation grossly intact. Cap refill <2.   Neurological: He is alert and oriented to person, place, and  time. No sensory deficit.  Skin: Skin is warm and dry. Capillary refill takes less than 2 seconds.  Multiple superficial abrasions present to anterior shin and ankle, no active bleeding.   Nursing note and vitals reviewed.    ED Treatments / Results  Labs (all labs ordered are listed, but only abnormal results are displayed) Labs Reviewed - No data to display  EKG  EKG Interpretation None       Radiology Dg Tibia/fibula Left  Result Date: 11/08/2016 CLINICAL DATA:  Twisting injury of the ankle when stepping off a trailer. EXAM: LEFT TIBIA AND FIBULA - 2 VIEW COMPARISON:  Left ankle series of today's date FINDINGS: There are acute fractures of the distal fibular metaphysis and of the medial malleolus and possibly of the posterior malleolus. The mid and proximal portions of the tibia and fibula are intact. The knee is grossly normal. IMPRESSION: Bimalleolar fracture dislocation of the left ankle. There may be a posterior malleolar fracture as  well. No fracture of the more proximal portions of the left tibia and fibula is observed. Electronically Signed   By: David  Martinique M.D.   On: 11/08/2016 12:15   Dg Ankle 2 Views Left  Result Date: 11/08/2016 CLINICAL DATA:  The patient walked off a trailer and tripped and twisted the left ankle. EXAM: LEFT ANKLE - 2 VIEW COMPARISON:  Left tibia and fibula of today's date FINDINGS: The patient has sustained an acute transversely oriented fracture of the medial malleolus. There is a spiral fracture of the distal fibular metadiaphysis with mild angulation apex anterior. There is disruption of the ankle joint mortise. There may be a posterior malleolar fracture but this is not well demonstrated. The talar dome and remainder of the talus appear intact. The calcaneus is intact. There is a plantar calcaneal spur. There is mild diffuse soft tissue swelling. IMPRESSION: The patient has sustained an acute bimalleolar fracture dislocation of the left ankle. I cannot absolutely exclude a posterior malleolar fracture as well. Electronically Signed   By: David  Martinique M.D.   On: 11/08/2016 12:14   Dg Ankle Left Port  Result Date: 11/08/2016 CLINICAL DATA:  Post reduction of ankle fracture. EXAM: PORTABLE LEFT ANKLE - 2 VIEW COMPARISON:  11/08/2016 radiographs FINDINGS: Trimalleolar fractures are again identified within improvement of position of medial malleolar fragment. Plaster cast obscures detail. No other significant abnormalities noted. IMPRESSION: Trimalleolar fractures. Electronically Signed   By: Margarette Canada M.D.   On: 11/08/2016 14:37    Procedures Procedures (including critical care time)  Medications Ordered in ED Medications  HYDROmorphone (DILAUDID) injection 1 mg (1 mg Intravenous Given 11/08/16 1107)  fentaNYL (SUBLIMAZE) injection 100 mcg (100 mcg Intravenous Given 11/08/16 1227)  lidocaine (XYLOCAINE) 2 % (with pres) injection 200 mg (200 mg Intradermal Given 11/08/16 1308)  bupivacaine  (MARCAINE) 0.5 % (with pres) injection 50 mL (50 mLs Infiltration Given 11/08/16 1327)  fentaNYL (SUBLIMAZE) injection 100 mcg (100 mcg Intravenous Given 11/08/16 1335)  midazolam (VERSED) injection 2 mg (2 mg Intravenous Given 11/08/16 1359)  fentaNYL (SUBLIMAZE) injection 100 mcg (50 mcg Intravenous Given 11/08/16 1405)     Initial Impression / Assessment and Plan / ED Course  I have reviewed the triage vital signs and the nursing notes.  Pertinent labs & imaging results that were available during my care of the patient were reviewed by me and considered in my medical decision making (see chart for details).    Patient presented with left ankle injury that occurred after  slipping off his trailer prior to arrival. EMS report pt with syncopal episode that occurred while on the stretcher that lasted a few seconds. Denies head injury or LOC. VSS. Exam showed deformity to left ankle with moderate swelling and tenderness to medial malleolus, left LE neurovascularly intact. Remaining exam unremarkable. EKG showed sinus rhythm with no acute ischemic changes. Suspect pt had vasovagal syncopal episode related to pain/ankle deformity. Pt given pain meds in the ED. Xrays showed bimalleolar fx dislocation of left ankle with possible posterior malleolar fx, no prox. Tib/fib fx. Consulted ortho. Ortho reduced and splint ankle in the ED. Repeat xray s/p reduction showed trimalleolar fx with improvement of position of malleolar fragment. Plan to have pt f/u with ortho in clinic in 1 week for f/u with plan of fixation once swelling has improved. Pt to remain NWB. Discussed results and plan for discharge with patient. Patient discharged home with a meds, NSAIDs and symptomatically treatment. Discussed return precautions.  Final Clinical Impressions(s) / ED Diagnoses   Final diagnoses:  Ankle fracture, left    New Prescriptions New Prescriptions   IBUPROFEN (ADVIL,MOTRIN) 600 MG TABLET    Take 1 tablet (600 mg  total) by mouth every 6 (six) hours as needed.   OXYCODONE-ACETAMINOPHEN (PERCOCET/ROXICET) 5-325 MG TABLET    Take 1 tablet by mouth every 4 (four) hours as needed for severe pain.     Nona Dell, PA-C 11/08/16 1510    Charlesetta Shanks, MD 11/30/16 [phone]

## 2016-11-08 NOTE — ED Notes (Signed)
Call to radiology for post reduction films

## 2016-11-08 NOTE — ED Notes (Signed)
Reduction of left ankle by PA.

## 2016-11-08 NOTE — ED Notes (Signed)
55 - Patient's wife returned to ED stating that the pharmacist would not fill the patient's prescription for Percocet d/t an allergy to codeine. Pt's wife upset because pt needs pain medication and now he cannot get anything filled. RN spoke to Dr. Darl Householder, who after reviewing the patient's chart, stated that the pharmacist may call him regarding this matter. Wife informed pharmacist via phone.   This RN just followed up with patient's wife via telephone, who stated that Dr. Darl Householder told the pharmacist to fill the medication and was very grateful.

## 2016-11-08 NOTE — Consult Note (Signed)
Reason for Consult:Ankle fx Referring Physician: [name] is an 61 y.o. male.  HPI: Luis Joseph was getting off a wet tailgate when he slipped and fell to the ground. He heard his ankle pop and had immediate pain. When EMS was loading him on the rig he had a syncopal event. He recovered before any intervention had to be done. X-rays showed a left ankle fx/dislocation and orthopedic surgery was consulted.  Past Medical History:  Diagnosis Date  . Allergic rhinitis   . Anemia in chronic kidney disease   . CKD (chronic kidney disease), stage III    NEPHROLOGIST-  DR Lavonia Dana  . Diabetes mellitus, type 2 ([place])   . GERD (gastroesophageal reflux disease) 05/21/1978  . Gout 05/21/1985  . Hematuria   . History of kidney stones   . Hydronephrosis, right   . Hyperlipidemia 05/21/1988  . Hyperparathyroidism, secondary renal ([place])   . Hypertension 05/21/1978  . Migraines    Better after retirement  . Renal cyst, left     Past Surgical History:  Procedure Laterality Date  . CYSTOSCOPY W/ URETERAL STENT PLACEMENT Right 11/25/2012   Procedure: CYSTOSCOPY WITH RETROGRADE PYELOGRAM/URETERAL STENT PLACEMENT;  Surgeon: Hanley Ben, MD;  Location: [facility];  Service: Urology;  Laterality: Right;  . CYSTOSCOPY/RETROGRADE/URETEROSCOPY Right 02/10/2013   Procedure: CYSTOSCOPY/RETROGRADE/URETEROSCOPY Right stent removal  Procedure: Cysto, Right Retrograde, Right Stent Pull, Right Diagnostic Ureteroscopy;  Surgeon: Alexis Frock, MD;  Location: [facility];  Service: Urology;  Laterality: Right;  . GASTRIC ROUX-EN-Y N/A 03/01/2014   Procedure: LAPAROSCOPIC ROUX-EN-Y GASTRIC BYPASS WITH UPPER ENDOSCOPY;  Surgeon: Kaylyn Lim, MD;  Location: [facility];  Service: General;  Laterality: N/A;  . LASIK    . PARTIAL NEPHRECTOMY  12/19/1977   RIGHT PYELOPLASTY AND removal benign cyst and stones    Family History  Problem Relation Age of Onset  . Heart  disease Mother   . Diabetes Mother   . Hypertension Mother   . Cancer Father        Lung  . Diabetes Sister   . Heart disease Maternal Grandfather   . Diabetes Maternal Grandfather   . Stroke Neg Hx   . Prostate cancer Neg Hx   . Colon cancer Neg Hx     Social History:  reports that he quit smoking about 39 years ago. He quit after 6.00 years of use. He has never used smokeless tobacco. He reports that he does not drink alcohol or use drugs.  Allergies:  Allergies  Allergen Reactions  . Codeine Sulfate Hives and Swelling  . Sildenafil Citrate Other (See Comments)    flushing    Medications: I have reviewed the patient's current medications.  No results found for this or any previous visit (from the past 48 hour(s)).  Dg Tibia/fibula Left  Result Date: 11/08/2016 CLINICAL DATA:  Twisting injury of the ankle when stepping off a trailer. EXAM: LEFT TIBIA AND FIBULA - 2 VIEW COMPARISON:  Left ankle series of today's date FINDINGS: There are acute fractures of the distal fibular metaphysis and of the medial malleolus and possibly of the posterior malleolus. The mid and proximal portions of the tibia and fibula are intact. The knee is grossly normal. IMPRESSION: Bimalleolar fracture dislocation of the left ankle. There may be a posterior malleolar fracture as well. No fracture of the more proximal portions of the left tibia and fibula is observed. Electronically Signed   By: David  Martinique M.D.  On: 11/08/2016 12:15   Dg Ankle 2 Views Left  Result Date: 11/08/2016 CLINICAL DATA:  The patient walked off a trailer and tripped and twisted the left ankle. EXAM: LEFT ANKLE - 2 VIEW COMPARISON:  Left tibia and fibula of today's date FINDINGS: The patient has sustained an acute transversely oriented fracture of the medial malleolus. There is a spiral fracture of the distal fibular metadiaphysis with mild angulation apex anterior. There is disruption of the ankle joint mortise. There may be a  posterior malleolar fracture but this is not well demonstrated. The talar dome and remainder of the talus appear intact. The calcaneus is intact. There is a plantar calcaneal spur. There is mild diffuse soft tissue swelling. IMPRESSION: The patient has sustained an acute bimalleolar fracture dislocation of the left ankle. I cannot absolutely exclude a posterior malleolar fracture as well. Electronically Signed   By: David  Martinique M.D.   On: 11/08/2016 12:14    Review of Systems  Constitutional: Negative for weight loss.  HENT: Negative for ear discharge, ear pain, hearing loss and tinnitus.   Eyes: Negative for blurred vision, double vision, photophobia and pain.  Respiratory: Negative for cough, sputum production and shortness of breath.   Cardiovascular: Negative for chest pain.  Gastrointestinal: Negative for abdominal pain, nausea and vomiting.  Genitourinary: Negative for dysuria, flank pain, frequency and urgency.  Musculoskeletal: Positive for joint pain (Left ankle). Negative for back pain, falls, myalgias and neck pain.  Neurological: Positive for dizziness and loss of consciousness. Negative for tingling, sensory change, focal weakness and headaches.  Endo/Heme/Allergies: Does not bruise/bleed easily.  Psychiatric/Behavioral: Negative for depression, memory loss and substance abuse. The patient is not nervous/anxious.    Blood pressure (!) 145/87, pulse 61, temperature 97.4 F (36.3 C), temperature source Oral, resp. rate 12, height 5\' 11"  (1.803 m), weight 106.1 kg (234 lb), SpO2 98 %. Physical Exam  Constitutional: He appears well-developed and well-nourished. No distress.  HENT:  Head: Normocephalic.  Eyes: Conjunctivae are normal. Right eye exhibits no discharge. Left eye exhibits no discharge. No scleral icterus.  Cardiovascular: Normal rate and regular rhythm.   Respiratory: Effort normal. No respiratory distress.  Musculoskeletal:  LLE No traumatic wounds, ecchymosis, or  rash  Ankle swollen, TTP  No effusions  Knee stable to varus/ valgus and anterior/posterior stress  Sens DPN, SPN, TN intact  Motor EHL, ext, flex, evers 5/5  DP 2+     Neurological: He is alert.  Skin: Skin is warm and dry. He is not diaphoretic.  Psychiatric: He has a normal mood and affect. His behavior is normal.    Assessment/Plan: Fall Left ankle fx/dislocation -- Will reduce and splint. Will need 1-2 weeks for reduction in swelling before definitive fixation. He may discharge to home from orthopedic standpoint. NWB on left leg. Syncope -- per [place], PA-C Orthopedic Surgery [phone] 11/08/2016, 1:16 PM

## 2016-11-12 ENCOUNTER — Encounter: Payer: Self-pay | Admitting: Internal Medicine

## 2016-11-13 ENCOUNTER — Other Ambulatory Visit (INDEPENDENT_AMBULATORY_CARE_PROVIDER_SITE_OTHER): Payer: Self-pay | Admitting: Orthopaedic Surgery

## 2016-11-13 ENCOUNTER — Encounter (INDEPENDENT_AMBULATORY_CARE_PROVIDER_SITE_OTHER): Payer: Self-pay | Admitting: Orthopaedic Surgery

## 2016-11-13 ENCOUNTER — Ambulatory Visit (INDEPENDENT_AMBULATORY_CARE_PROVIDER_SITE_OTHER): Payer: No Typology Code available for payment source | Admitting: Orthopaedic Surgery

## 2016-11-13 DIAGNOSIS — S82852A Displaced trimalleolar fracture of left lower leg, initial encounter for closed fracture: Secondary | ICD-10-CM

## 2016-11-13 MED ORDER — OXYCODONE HCL 5 MG PO TABS: 5.0000 mg | ORAL_TABLET | ORAL | 0 refills | Status: DC | PRN

## 2016-11-13 NOTE — Progress Notes (Signed)
Office Visit Note   Patient: Luis Joseph           Date of Birth: 16-Apr-1956           MRN: [id number] Visit Date: 11/13/2016              Requested by: Tonia Ghent, MD [address] [place], [place] [address] PCP: Tonia Ghent, MD   Assessment & Plan: Visit Diagnoses:  1. Closed displaced trimalleolar fracture of left ankle, initial encounter     Plan: X-rays were reviewed with the patient and his wife. Recommendation is for ORIF. We discussed risks benefits alternatives to surgery and they understand wish to proceed. Pain medicine was refilled.  Follow-Up Instructions: Return in about 2 weeks (around 11/27/2016).   Orders:  No orders of the defined types were placed in this encounter.  Meds ordered this encounter  Medications  . oxyCODONE (OXY IR/ROXICODONE) 5 MG immediate release tablet    Sig: Take 1-3 tablets (5-15 mg total) by mouth every 4 (four) hours as needed.    Dispense:  50 tablet    Refill:  0      Procedures: No procedures performed   Clinical Data: No additional findings.   Subjective: Chief Complaint  Patient presents with  . Left Ankle - Fracture, Follow-up    Patient is a very pleasant 61 year old gentleman who comes in with a displaced left trimalleolar ankle fracture dislocation from last week. He had a mechanical fall when he slipped. He has been at home since then. He follows up today for discussion of surgery. He has diabetes which is diet controlled. He is a nonsmoker. Denies any history of DVTs. Pain does not radiate.    Review of Systems  Constitutional: Negative.   All other systems reviewed and are negative.    Objective: Vital Signs: There were no vitals taken for this visit.  Physical Exam  Constitutional: He is oriented to person, place, and time. He appears well-developed and well-nourished.  HENT:  Head: Normocephalic and atraumatic.  Eyes: Pupils are equal, round, and reactive to light.  Neck: Neck  supple.  Pulmonary/Chest: Effort normal.  Abdominal: Soft.  Musculoskeletal: Normal range of motion.  Neurological: He is alert and oriented to person, place, and time.  Skin: Skin is warm.  Psychiatric: He has a normal mood and affect. His behavior is normal. Judgment and thought content normal.  Nursing note and vitals reviewed.   Ortho Exam Left ankle exam shows a well fitting splint. The toes warm well-perfused. Specialty Comments:  No specialty comments available.  Imaging: No results found.   PMFS History: Patient Active Problem List   Diagnosis Date Noted  . Closed displaced trimalleolar fracture of left ankle 11/08/2016  . Chest discomfort 10/10/2016  . Abdominal pain 01/11/2016  . 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 Abnormal x-ray 10/20/2013  . Paresthesia 10/20/2013  . Renal stone 04/24/2013  . Routine general medical examination at a health care facility 09/29/2012  . Elevated serum creatinine 09/29/2012  . Migraine headache 08/04/2012  . Erectile dysfunction 07/25/2011  . Postnasal drip 07/25/2011  . Muscle cramp, nocturnal 07/25/2011  . Vertigo 07/25/2011  . FATIGUE 06/06/2010  . Diabetes mellitus without complication ([place]) 37/08/8887  . SKIN LESION 03/17/2010  . DERMATITIS 10/20/2009  . ANXIETY 06/30/2008  . UNSPECIFIED SLEEP DISTURBANCE 06/10/2008  . OBESITY, UNSPECIFIED 04/21/2007  . HLD (hyperlipidemia) 05/21/1988  . Gout 05/21/1985  . Essential hypertension  05/21/1978  . GERD 05/21/1978  . HEADACHE, CLUSTER 05/21/1976   Past Medical History:  Diagnosis Date  . Allergic rhinitis   . Anemia in chronic kidney disease   . CKD (chronic kidney disease), stage III    NEPHROLOGIST-  DR Lavonia Dana  . Diabetes mellitus, type 2 ([place])   . GERD (gastroesophageal reflux disease) 05/21/1978  . Gout 05/21/1985  . Hematuria   . History of kidney stones   . Hydronephrosis, right   . Hyperlipidemia 05/21/1988  . Hyperparathyroidism,  secondary renal ([place])   . Hypertension 05/21/1978  . Migraines    Better after retirement  . Renal cyst, left     Family History  Problem Relation Age of Onset  . Heart disease Mother   . Diabetes Mother   . Hypertension Mother   . Cancer Father        Lung  . Diabetes Sister   . Heart disease Maternal Grandfather   . Diabetes Maternal Grandfather   . Stroke Neg Hx   . Prostate cancer Neg Hx   . Colon cancer Neg Hx     Past Surgical History:  Procedure Laterality Date  . CYSTOSCOPY W/ URETERAL STENT PLACEMENT Right 11/25/2012   Procedure: CYSTOSCOPY WITH RETROGRADE PYELOGRAM/URETERAL STENT PLACEMENT;  Surgeon: Hanley Ben, MD;  Location: [facility];  Service: Urology;  Laterality: Right;  . CYSTOSCOPY/RETROGRADE/URETEROSCOPY Right 02/10/2013   Procedure: CYSTOSCOPY/RETROGRADE/URETEROSCOPY Right stent removal  Procedure: Cysto, Right Retrograde, Right Stent Pull, Right Diagnostic Ureteroscopy;  Surgeon: Alexis Frock, MD;  Location: [facility];  Service: Urology;  Laterality: Right;  . GASTRIC ROUX-EN-Y N/A 03/01/2014   Procedure: LAPAROSCOPIC ROUX-EN-Y GASTRIC BYPASS WITH UPPER ENDOSCOPY;  Surgeon: Kaylyn Lim, MD;  Location: [facility];  Service: General;  Laterality: N/A;  . LASIK    . PARTIAL NEPHRECTOMY  12/19/1977   RIGHT PYELOPLASTY AND removal benign cyst and stones   Social History   Occupational History  . Retired from [employer], [place] History Main Topics  . Smoking status: Former Smoker    Years: 6.00    Quit date: 11/17/1977  . Smokeless tobacco: Never Used  . Alcohol use No  . Drug use: No  . Sexual activity: Yes

## 2016-11-14 ENCOUNTER — Ambulatory Visit (HOSPITAL_BASED_OUTPATIENT_CLINIC_OR_DEPARTMENT_OTHER)
Admission: RE | Admit: 2016-11-14 | Discharge: 2016-11-14 | Disposition: A | Discharge status: Home / Self Care | Payer: No Typology Code available for payment source | Source: Ambulatory Visit | Attending: Orthopaedic Surgery | Admitting: Orthopaedic Surgery

## 2016-11-14 ENCOUNTER — Ambulatory Visit (HOSPITAL_COMMUNITY): Payer: No Typology Code available for payment source

## 2016-11-14 ENCOUNTER — Encounter (HOSPITAL_BASED_OUTPATIENT_CLINIC_OR_DEPARTMENT_OTHER): Payer: Self-pay | Admitting: *Deleted

## 2016-11-14 ENCOUNTER — Ambulatory Visit (HOSPITAL_BASED_OUTPATIENT_CLINIC_OR_DEPARTMENT_OTHER): Payer: No Typology Code available for payment source | Admitting: Certified Registered"

## 2016-11-14 ENCOUNTER — Encounter (HOSPITAL_BASED_OUTPATIENT_CLINIC_OR_DEPARTMENT_OTHER)
Admission: RE | Disposition: A | Discharge status: Home / Self Care | Payer: Self-pay | Source: Ambulatory Visit | Attending: Orthopaedic Surgery

## 2016-11-14 DIAGNOSIS — Z87891 Personal history of nicotine dependence: Secondary | ICD-10-CM | POA: Diagnosis not present

## 2016-11-14 DIAGNOSIS — I129 Hypertensive chronic kidney disease with stage 1 through stage 4 chronic kidney disease, or unspecified chronic kidney disease: Secondary | ICD-10-CM | POA: Diagnosis not present

## 2016-11-14 DIAGNOSIS — D631 Anemia in chronic kidney disease: Secondary | ICD-10-CM | POA: Diagnosis not present

## 2016-11-14 DIAGNOSIS — Z87442 Personal history of urinary calculi: Secondary | ICD-10-CM | POA: Diagnosis not present

## 2016-11-14 DIAGNOSIS — Z4789 Encounter for other orthopedic aftercare: Secondary | ICD-10-CM

## 2016-11-14 DIAGNOSIS — M109 Gout, unspecified: Secondary | ICD-10-CM | POA: Diagnosis not present

## 2016-11-14 DIAGNOSIS — Z9884 Bariatric surgery status: Secondary | ICD-10-CM | POA: Diagnosis not present

## 2016-11-14 DIAGNOSIS — Z905 Acquired absence of kidney: Secondary | ICD-10-CM | POA: Insufficient documentation

## 2016-11-14 DIAGNOSIS — S82852A Displaced trimalleolar fracture of left lower leg, initial encounter for closed fracture: Secondary | ICD-10-CM | POA: Diagnosis not present

## 2016-11-14 DIAGNOSIS — E1122 Type 2 diabetes mellitus with diabetic chronic kidney disease: Secondary | ICD-10-CM | POA: Insufficient documentation

## 2016-11-14 DIAGNOSIS — Z79899 Other long term (current) drug therapy: Secondary | ICD-10-CM | POA: Diagnosis not present

## 2016-11-14 DIAGNOSIS — N183 Chronic kidney disease, stage 3 (moderate): Secondary | ICD-10-CM | POA: Insufficient documentation

## 2016-11-14 HISTORY — PX: ORIF ANKLE FRACTURE: SHX5408

## 2016-11-14 SURGERY — OPEN REDUCTION INTERNAL FIXATION (ORIF) ANKLE FRACTURE
Anesthesia: General | Site: Ankle | Laterality: Left

## 2016-11-14 MED ORDER — FENTANYL CITRATE (PF) 100 MCG/2ML IJ SOLN
INTRAMUSCULAR | Status: AC
  Filled 2016-11-14: qty 2

## 2016-11-14 MED ORDER — LACTATED RINGERS IV SOLN
INTRAVENOUS | Status: DC
  Administered 2016-11-14 (×2): via INTRAVENOUS

## 2016-11-14 MED ORDER — OXYCODONE HCL ER 10 MG PO T12A: 10.0000 mg | EXTENDED_RELEASE_TABLET | Freq: Two times a day (BID) | ORAL | 0 refills | Status: DC

## 2016-11-14 MED ORDER — MIDAZOLAM HCL 2 MG/2ML IJ SOLN
1.0000 mg | INTRAMUSCULAR | Status: DC | PRN
  Administered 2016-11-14: 2 mg via INTRAVENOUS

## 2016-11-14 MED ORDER — PROMETHAZINE HCL 25 MG PO TABS: 25.0000 mg | ORAL_TABLET | Freq: Four times a day (QID) | ORAL | 1 refills | Status: DC | PRN

## 2016-11-14 MED ORDER — SCOPOLAMINE 1 MG/3DAYS TD PT72: 1.0000 | MEDICATED_PATCH | Freq: Once | TRANSDERMAL | Status: DC | PRN

## 2016-11-14 MED ORDER — DEXAMETHASONE SODIUM PHOSPHATE 10 MG/ML IJ SOLN
INTRAMUSCULAR | Status: DC | PRN
  Administered 2016-11-14: 10 mg via INTRAVENOUS

## 2016-11-14 MED ORDER — CEFAZOLIN SODIUM-DEXTROSE 2-4 GM/100ML-% IV SOLN
2.0000 g | INTRAVENOUS | Status: AC
  Administered 2016-11-14: 2 g via INTRAVENOUS

## 2016-11-14 MED ORDER — ASPIRIN EC 325 MG PO TBEC
325.0000 mg | DELAYED_RELEASE_TABLET | Freq: Two times a day (BID) | ORAL | 0 refills | Status: DC
Start: 2016-11-14 — End: 2016-11-28

## 2016-11-14 MED ORDER — MIDAZOLAM HCL 2 MG/2ML IJ SOLN
INTRAMUSCULAR | Status: AC
  Filled 2016-11-14: qty 2

## 2016-11-14 MED ORDER — PROPOFOL 10 MG/ML IV BOLUS
INTRAVENOUS | Status: DC | PRN
  Administered 2016-11-14: 200 mg via INTRAVENOUS

## 2016-11-14 MED ORDER — LIDOCAINE HCL (CARDIAC) 20 MG/ML IV SOLN
INTRAVENOUS | Status: DC | PRN
  Administered 2016-11-14: 100 mg via INTRAVENOUS

## 2016-11-14 MED ORDER — METHOCARBAMOL 750 MG PO TABS: 750.0000 mg | ORAL_TABLET | Freq: Two times a day (BID) | ORAL | 0 refills | Status: DC | PRN

## 2016-11-14 MED ORDER — FENTANYL CITRATE (PF) 100 MCG/2ML IJ SOLN
50.0000 ug | INTRAMUSCULAR | Status: DC | PRN
  Administered 2016-11-14: 50 ug via INTRAVENOUS

## 2016-11-14 MED ORDER — ONDANSETRON HCL 4 MG/2ML IJ SOLN
INTRAMUSCULAR | Status: DC | PRN
  Administered 2016-11-14: 4 mg via INTRAVENOUS

## 2016-11-14 MED ORDER — ONDANSETRON HCL 4 MG PO TABS: 4.0000 mg | ORAL_TABLET | Freq: Three times a day (TID) | ORAL | 0 refills | Status: DC | PRN

## 2016-11-14 MED ORDER — BUPIVACAINE-EPINEPHRINE (PF) 0.5% -1:200000 IJ SOLN
INTRAMUSCULAR | Status: DC | PRN
  Administered 2016-11-14: 30 mL via PERINEURAL

## 2016-11-14 MED ORDER — SENNOSIDES-DOCUSATE SODIUM 8.6-50 MG PO TABS: 1.0000 | ORAL_TABLET | Freq: Every evening | ORAL | 1 refills | Status: DC | PRN

## 2016-11-14 MED ORDER — CEFAZOLIN SODIUM-DEXTROSE 2-4 GM/100ML-% IV SOLN
INTRAVENOUS | Status: AC
  Filled 2016-11-14: qty 100

## 2016-11-14 MED ORDER — OXYCODONE HCL 5 MG PO TABS: 5.0000 mg | ORAL_TABLET | ORAL | 0 refills | Status: DC | PRN

## 2016-11-14 SURGICAL SUPPLY — 87 items
BANDAGE ACE 4X5 VEL STRL LF (GAUZE/BANDAGES/DRESSINGS) IMPLANT
BANDAGE ACE 6X5 VEL STRL LF (GAUZE/BANDAGES/DRESSINGS) ×3 IMPLANT
BANDAGE ESMARK 6X9 LF (GAUZE/BANDAGES/DRESSINGS) ×1 IMPLANT
BIT DRILL 3.5X122MM AO FIT (BIT) ×2 IMPLANT
BIT DRILL CANN 2.7 (BIT) ×2
BIT DRILL CANN 2.7MM (BIT) ×1
BIT DRILL SRG 2.7XCANN AO CPLG (BIT) IMPLANT
BIT DRL SRG 2.7XCANN AO CPLNG (BIT) ×1
BLADE HEX COATED 2.75 (ELECTRODE) ×3 IMPLANT
BLADE SURG 15 STRL LF DISP TIS (BLADE) ×2 IMPLANT
BLADE SURG 15 STRL SS (BLADE) ×6
BNDG CMPR 9X6 STRL LF SNTH (GAUZE/BANDAGES/DRESSINGS) ×1
BNDG COHESIVE 6X5 TAN STRL LF (GAUZE/BANDAGES/DRESSINGS) ×3 IMPLANT
BNDG ESMARK 6X9 LF (GAUZE/BANDAGES/DRESSINGS) ×3
BRUSH SCRUB EZ PLAIN DRY (MISCELLANEOUS) ×3 IMPLANT
CANISTER SUCT 1200ML W/VALVE (MISCELLANEOUS) ×3 IMPLANT
COVER BACK TABLE 60X90IN (DRAPES) ×3 IMPLANT
COVER MAYO STAND STRL (DRAPES) IMPLANT
CUFF TOURNIQUET SINGLE 34IN LL (TOURNIQUET CUFF) IMPLANT
DECANTER SPIKE VIAL GLASS SM (MISCELLANEOUS) IMPLANT
DRAPE C-ARM 42X72 X-RAY (DRAPES) ×3 IMPLANT
DRAPE C-ARMOR (DRAPES) ×3 IMPLANT
DRAPE EXTREMITY T 121X128X90 (DRAPE) ×3 IMPLANT
DRAPE IMP U-DRAPE 54X76 (DRAPES) ×3 IMPLANT
DRAPE SURG 17X23 STRL (DRAPES) ×6 IMPLANT
DRILL 2.6X122MM WL AO SHAFT (BIT) ×2 IMPLANT
DRSG PAD ABDOMINAL 8X10 ST (GAUZE/BANDAGES/DRESSINGS) ×6 IMPLANT
DURAPREP 26ML APPLICATOR (WOUND CARE) ×6 IMPLANT
ELECT REM PT RETURN 9FT ADLT (ELECTROSURGICAL) ×3
ELECTRODE REM PT RTRN 9FT ADLT (ELECTROSURGICAL) ×1 IMPLANT
GAUZE SPONGE 4X4 12PLY STRL (GAUZE/BANDAGES/DRESSINGS) ×3 IMPLANT
GAUZE XEROFORM 1X8 LF (GAUZE/BANDAGES/DRESSINGS) ×3 IMPLANT
GLOVE SKINSENSE NS SZ7.5 (GLOVE) ×2
GLOVE SKINSENSE STRL SZ7.5 (GLOVE) ×1 IMPLANT
GLOVE SURG SYN 7.5  E (GLOVE) ×2
GLOVE SURG SYN 7.5 E (GLOVE) ×1 IMPLANT
GLOVE SURG SYN 7.5 PF PI (GLOVE) ×1 IMPLANT
GOWN STRL REIN XL XLG (GOWN DISPOSABLE) ×3 IMPLANT
GOWN STRL REUS W/ TWL LRG LVL3 (GOWN DISPOSABLE) ×1 IMPLANT
GOWN STRL REUS W/TWL LRG LVL3 (GOWN DISPOSABLE) ×3
K-WIRE ORTHOPEDIC 1.4X150L (WIRE) ×9
KWIRE ORTHOPEDIC 1.4X150L (WIRE) IMPLANT
MANIFOLD NEPTUNE II (INSTRUMENTS) ×3 IMPLANT
NEEDLE HYPO 22GX1.5 SAFETY (NEEDLE) IMPLANT
NS IRRIG 1000ML POUR BTL (IV SOLUTION) ×3 IMPLANT
PACK BASIN DAY SURGERY FS (CUSTOM PROCEDURE TRAY) ×3 IMPLANT
PAD CAST 3X4 CTTN HI CHSV (CAST SUPPLIES) IMPLANT
PAD CAST 4YDX4 CTTN HI CHSV (CAST SUPPLIES) IMPLANT
PADDING CAST COTTON 3X4 STRL (CAST SUPPLIES)
PADDING CAST COTTON 4X4 STRL (CAST SUPPLIES)
PADDING CAST COTTON 6X4 STRL (CAST SUPPLIES) IMPLANT
PADDING CAST SYN 6 (CAST SUPPLIES) ×2
PADDING CAST SYNTHETIC 4 (CAST SUPPLIES) ×2
PADDING CAST SYNTHETIC 4X4 STR (CAST SUPPLIES) ×1 IMPLANT
PADDING CAST SYNTHETIC 6X4 NS (CAST SUPPLIES) ×1 IMPLANT
PENCIL BUTTON HOLSTER BLD 10FT (ELECTRODE) ×3 IMPLANT
PLATE FIBULA 5H (Plate) ×3 IMPLANT
SCREW BONE 18 (Screw) ×3 IMPLANT
SCREW BONE NON-LCKING 3.5X12MM (Screw) ×6 IMPLANT
SCREW CANNULATED 4.0X36MM FT (Screw) ×2 IMPLANT
SCREW CANNULATED 4.0X36MM PT (Screw) ×2 IMPLANT
SCREW LOCKING 3.5X12 (Screw) ×6 IMPLANT
SCREW LOCKING 3.5X16MM (Screw) ×2 IMPLANT
SCREW LOCKING 3.5X18MM (Screw) ×4 IMPLANT
SCREW NONLOCK 22MM (Screw) ×3 IMPLANT
SHEET MEDIUM DRAPE 40X70 STRL (DRAPES) ×3 IMPLANT
SLEEVE SCD COMPRESS KNEE MED (MISCELLANEOUS) ×3 IMPLANT
SPLINT FIBERGLASS 4X30 (CAST SUPPLIES) IMPLANT
SPONGE LAP 18X18 X RAY DECT (DISPOSABLE) ×3 IMPLANT
SUCTION FRAZIER HANDLE 10FR (MISCELLANEOUS) ×2
SUCTION TUBE FRAZIER 10FR DISP (MISCELLANEOUS) ×1 IMPLANT
SUT ETHILON 3 0 PS 1 (SUTURE) IMPLANT
SUT VIC AB 0 CT1 27 (SUTURE)
SUT VIC AB 0 CT1 27XBRD ANBCTR (SUTURE) IMPLANT
SUT VIC AB 2-0 CT1 27 (SUTURE) ×3
SUT VIC AB 2-0 CT1 TAPERPNT 27 (SUTURE) ×1 IMPLANT
SUT VIC AB 3-0 SH 27 (SUTURE)
SUT VIC AB 3-0 SH 27X BRD (SUTURE) IMPLANT
SYR BULB 3OZ (MISCELLANEOUS) ×3 IMPLANT
SYR CONTROL 10ML LL (SYRINGE) IMPLANT
TOWEL OR 17X24 6PK STRL BLUE (TOWEL DISPOSABLE) ×3 IMPLANT
TOWEL OR NON WOVEN STRL DISP B (DISPOSABLE) ×3 IMPLANT
TRAY DSU PREP LF (CUSTOM PROCEDURE TRAY) ×3 IMPLANT
TUBE CONNECTING 20'X1/4 (TUBING) ×1
TUBE CONNECTING 20X1/4 (TUBING) ×2 IMPLANT
UNDERPAD 30X30 (UNDERPADS AND DIAPERS) ×3 IMPLANT
YANKAUER SUCT BULB TIP NO VENT (SUCTIONS) ×3 IMPLANT

## 2016-11-14 NOTE — Anesthesia Preprocedure Evaluation (Addendum)
Anesthesia Evaluation  Patient identified by MRN, date of birth, ID band Patient awake    Reviewed: Allergy & Precautions, NPO status , Patient's Chart, lab work & pertinent test results, reviewed documented beta blocker date and time   Airway Mallampati: III  TM Distance: <3 FB Neck ROM: Full    Dental  (+) Teeth Intact, Dental Advisory Given   Pulmonary former smoker,    Pulmonary exam normal breath sounds clear to auscultation       Cardiovascular hypertension, Pt. on medications and Pt. on home beta blockers Normal cardiovascular exam Rhythm:Regular Rate:Normal     Neuro/Psych  Headaches, PSYCHIATRIC DISORDERS Anxiety    GI/Hepatic Neg liver ROS, GERD  ,S/p roux-en-y   Endo/Other  diabetes, Well Controlled, Type 2Obesity   Renal/GU Renal InsufficiencyRenal diseaseS/p partial nephrectomy      Musculoskeletal negative musculoskeletal ROS (+)   Abdominal   Peds  Hematology  (+) Blood dyscrasia, anemia ,   Anesthesia Other Findings Day of surgery medications reviewed with the patient.  Reproductive/Obstetrics                            Anesthesia Physical Anesthesia Plan  ASA: III  Anesthesia Plan: General   Post-op Pain Management:  Regional for Post-op pain   Induction: Intravenous  PONV Risk Score and Plan: 2 and Ondansetron, Dexamethasone and Midazolam  Airway Management Planned: LMA  Additional Equipment:   Intra-op Plan:   Post-operative Plan: Extubation in OR  Informed Consent: I have reviewed the patients History and Physical, chart, labs and discussed the procedure including the risks, benefits and alternatives for the proposed anesthesia with the patient or authorized representative who has indicated his/her understanding and acceptance.   Dental advisory given  Plan Discussed with: CRNA  Anesthesia Plan Comments: (Risks/benefits of general anesthesia discussed  with patient including risk of damage to teeth, lips, gum, and tongue, nausea/vomiting, allergic reactions to medications, and the possibility of heart attack, stroke and death.  All patient questions answered.  Patient wishes to proceed.)        Anesthesia Quick Evaluation

## 2016-11-14 NOTE — Progress Notes (Signed)
Assisted Dr. Gifford Shave with left, ultrasound guided, popliteal/saphenous block. Side rails up, monitors on throughout procedure. See vital signs in flow sheet. Tolerated Procedure well.

## 2016-11-14 NOTE — Transfer of Care (Signed)
Immediate Anesthesia Transfer of Care Note  Patient: Luis Joseph  Procedure(s) Performed: Procedure(s): OPEN REDUCTION INTERNAL FIXATION (ORIF) LEFT TRIMALLEOLAR ANKLE FRACTURE (Left)  Patient Location: PACU  Anesthesia Type:GA combined with regional for post-op pain  Level of Consciousness: awake, sedated and responds to stimulation  Airway & Oxygen Therapy: Patient Spontanous Breathing and Patient connected to face mask oxygen  Post-op Assessment: Report given to RN and Post -op Vital signs reviewed and stable  Post vital signs: Reviewed and stable  Last Vitals:  Vitals:   11/14/16 1210 11/14/16 1215  BP:    Pulse: (!) 56 (!) 52  Resp: 10 10  Temp:      Last Pain:  Vitals:   11/14/16 1140  TempSrc: Oral  PainSc: 0-No pain      Patients Stated Pain Goal: 0 (89/38/10 1751)  Complications: No apparent anesthesia complications
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
## 2016-11-14 NOTE — Anesthesia Procedure Notes (Addendum)
Anesthesia Regional Block: Popliteal block   Pre-Anesthetic Checklist: ,, timeout performed, Correct Patient, Correct Site, Correct Laterality, Correct Procedure, Correct Position, site marked, Risks and benefits discussed,  Surgical consent,  Pre-op evaluation,  At surgeon's request and post-op pain management  Laterality: Left  Prep: chloraprep       Needles:  Injection technique: Single-shot  Needle Type: Echogenic Needle     Needle Length: 9cm  Needle Gauge: 21     Additional Needles:   Procedures: ultrasound guided,,,,,,,,  Narrative:  Start time: 11/14/2016 12:05 PM End time: 11/14/2016 12:10 PM Injection made incrementally with aspirations every 5 mL.  Performed by: Personally  Anesthesiologist: Catalina Gravel  Additional Notes: No pain on injection. No increased resistance to injection. Injection made in 5cc increments.  Good needle visualization.  Patient tolerated procedure well.  Combined left Popliteal/saphenous nerve block.

## 2016-11-14 NOTE — Discharge Instructions (Signed)
° ° °  1. Keep splint clean and dry 2. Elevate foot above level of the heart 3. Take aspirin to prevent blood clots 4. Take pain meds as needed 5. Strict non weight bearing to operative extremity Regional Anesthesia Blocks  1. Numbness or the inability to move the "blocked" extremity may last from 3-48 hours after placement. The length of time depends on the medication injected and your individual response to the medication. If the numbness is not going away after 48 hours, call your surgeon.  2. The extremity that is blocked will need to be protected until the numbness is gone and the  Strength has returned. Because you cannot feel it, you will need to take extra care to avoid injury. Because it may be weak, you may have difficulty moving it or using it. You may not know what position it is in without looking at it while the block is in effect.  3. For blocks in the legs and feet, returning to weight bearing and walking needs to be done carefully. You will need to wait until the numbness is entirely gone and the strength has returned. You should be able to move your leg and foot normally before you try and bear weight or walk. You will need someone to be with you when you first try to ensure you do not fall and possibly risk injury.  4. Bruising and tenderness at the needle site are common side effects and will resolve in a few days.  5. Persistent numbness or new problems with movement should be communicated to the surgeon or the [facility] ([phone])/ [facility] ([phone]).   Post Anesthesia Home Care Instructions  Activity: Get plenty of rest for the remainder of the day. A responsible individual must stay with you for 24 hours following the procedure.  For the next 24 hours, DO NOT: -Drive a car -Operate machinery -Drink alcoholic beverages -Take any medication unless instructed by your physician -Make any legal decisions or sign important  papers.  Meals: Start with liquid foods such as gelatin or soup. Progress to regular foods as tolerated. Avoid greasy, spicy, heavy foods. If nausea and/or vomiting occur, drink only clear liquids until the nausea and/or vomiting subsides. Call your physician if vomiting continues.  Special Instructions/Symptoms: Your throat may feel dry or sore from the anesthesia or the breathing tube placed in your throat during surgery. If this causes discomfort, gargle with warm salt water. The discomfort should disappear within 24 hours.  If you had a scopolamine patch placed behind your ear for the management of post- operative nausea and/or vomiting:  1. The medication in the patch is effective for 72 hours, after which it should be removed.  Wrap patch in a tissue and discard in the trash. Wash hands thoroughly with soap and water. 2. You may remove the patch earlier than 72 hours if you experience unpleasant side effects which may include dry mouth, dizziness or visual disturbances. 3. Avoid touching the patch. Wash your hands with soap and water after contact with the patch.     

## 2016-11-14 NOTE — Op Note (Signed)
   Date of Surgery: 11/14/2016  INDICATIONS: Luis Joseph is a 61 y.o.-year-old male who sustained a left ankle fracture; he was indicated for open reduction and internal fixation due to the displaced nature of the articular fracture and came to the operating room today for this procedure. The patient did consent to the procedure after discussion of the risks and benefits.  PREOPERATIVE DIAGNOSIS: left trimalleolar ankle fracture  POSTOPERATIVE DIAGNOSIS: Same.  PROCEDURE: Open treatment of left ankle fracture with internal fixation. Trimalleolar w/o fixation of posterior malleolus CPT 27822.  SURGEON: N. Eduard Roux, M.D.  ASSIST: none.  ANESTHESIA:  general  TOURNIQUET TIME: 1 hr  IV FLUIDS AND URINE: See anesthesia.  ESTIMATED BLOOD LOSS: minimal mL.  IMPLANTS: Stryker Variax 5 hole distal fibula plate  COMPLICATIONS: None.  DESCRIPTION OF PROCEDURE: The patient was brought to the operating room and placed supine on the operating table.  The patient had been signed prior to the procedure and this was documented. The patient had the anesthesia placed by the anesthesiologist.  A nonsterile tourniquet was placed on the upper thigh.  The prep verification and incision time-outs were performed to confirm that this was the correct patient, site, side and location. The patient had an SCD on the opposite lower extremity. The patient did receive antibiotics prior to the incision and was re-dosed during the procedure as needed at indicated intervals.  The patient had the lower extremity prepped and draped in the standard surgical fashion.  The extremity was exsanguinated using an esmarch bandage and the tourniquet was inflated to 300 mm Hg.  An incision directly lateral to the distal fibula was created. Dissection was carried down to the fibula. Subperiosteal elevation was performed. The fracture was exposed. Entrapped periosteum and organized hematoma was removed from the fracture site. The  fracture was then reduced and clamped in place. An anterior to posterior lag screw was then placed using standard AO technique. We then placed a precontoured distal fibula plate at the appropriate position. Locking and nonlocking screws were then placed using standard AO technique. We then turned our attention to the medial malleolus fracture. A separate incision was made on the medial aspect of the medial malleolus. Dissection was carried down to the bone. The neurovascular bundle was identified and protected. Subperiosteal elevation was performed. Entrapped periosteum was removed from the fracture site. The fracture was then reduced using a dental pick. This was confirmed using fluoroscopy. Two parallel K wires were then advanced across the medial malleolus fracture. I then placed 2 cannulated screws over the K wires one partially-threaded 1 fully threaded. The K wires were then removed. Final x-rays were taken. The syndesmosis was stable to stress. The wounds were then thoroughly irrigated and closed in layer fashion using 0 Vicryl, 2-0 Vicryl, 3-0 nylon. Sterile dressings were applied. Foot was immobilized in a short leg splint. Patient tolerated the procedure well and no immediate complications.  POSTOPERATIVE PLAN: Luis Joseph will remain nonweightbearing on this leg for approximately 6 weeks; Luis Joseph will return for suture removal in 2 weeks.  He will be immobilized in a short leg splint and then transitioned to a CAM walker at his first follow up appointment.  Luis Joseph will receive DVT prophylaxis based on other medications, activity level, and risk ratio of bleeding to thrombosis.  Azucena Cecil, MD [place] 1:45 PM

## 2016-11-14 NOTE — Anesthesia Procedure Notes (Signed)
Procedure Name: LMA Insertion Date/Time: 11/14/2016 12:18 PM Performed by: Charlie Char D Pre-anesthesia Checklist: Patient identified, Emergency Drugs available, Suction available and Patient being monitored Patient Re-evaluated:Patient Re-evaluated prior to inductionOxygen Delivery Method: Circle system utilized Preoxygenation: Pre-oxygenation with 100% oxygen Intubation Type: IV induction Ventilation: Mask ventilation without difficulty LMA: LMA inserted LMA Size: 4.0 Number of attempts: 1 Airway Equipment and Method: Bite block Placement Confirmation: positive ETCO2 Tube secured with: Tape Dental Injury: Teeth and Oropharynx as per pre-operative assessment

## 2016-11-14 NOTE — Anesthesia Postprocedure Evaluation (Signed)
Anesthesia Post Note  Patient: THAER MIYOSHI  Procedure(s) Performed: Procedure(s) (LRB): OPEN REDUCTION INTERNAL FIXATION (ORIF) LEFT TRIMALLEOLAR ANKLE FRACTURE (Left)     Patient location during evaluation: PACU Anesthesia Type: General Level of consciousness: awake and alert Pain management: pain level controlled Vital Signs Assessment: post-procedure vital signs reviewed and stable Respiratory status: spontaneous breathing, nonlabored ventilation, respiratory function stable and patient connected to nasal cannula oxygen Cardiovascular status: blood pressure returned to baseline and stable Postop Assessment: no signs of nausea or vomiting Anesthetic complications: no    Last Vitals:  Vitals:   11/14/16 1500 11/14/16 1503  BP: (!) 144/80 137/80  Pulse: 79   Resp: 19   Temp:  36.6 C    Last Pain:  Vitals:   11/14/16 1503  TempSrc:   PainSc: 2                  Catalina Gravel

## 2016-11-16 ENCOUNTER — Encounter (HOSPITAL_BASED_OUTPATIENT_CLINIC_OR_DEPARTMENT_OTHER): Payer: Self-pay | Admitting: Orthopaedic Surgery

## 2016-11-27 ENCOUNTER — Ambulatory Visit (INDEPENDENT_AMBULATORY_CARE_PROVIDER_SITE_OTHER): Payer: No Typology Code available for payment source

## 2016-11-27 ENCOUNTER — Ambulatory Visit (INDEPENDENT_AMBULATORY_CARE_PROVIDER_SITE_OTHER): Payer: No Typology Code available for payment source | Admitting: Orthopaedic Surgery

## 2016-11-27 DIAGNOSIS — S82852A Displaced trimalleolar fracture of left lower leg, initial encounter for closed fracture: Secondary | ICD-10-CM

## 2016-11-27 NOTE — Progress Notes (Signed)
Cardiology Office Note   Date:  11/28/2016   ID:  Luis Joseph, DOB [birth year], MRN [id number]  PCP:  Tonia Ghent, MD  Cardiologist:   Dorris Carnes, MD    Referred by Orlinda Blalock for chest disccomfort     History of Present Illness: Luis Joseph is a 61 y.o. male with a history of abdominal pain L and R  INtermittent   Occasional room sping   Chest discomfort with walking   Started in April  Better with rest    Nothing now  Pain was R parsternal  Not associated  Lasted Seconds     Will have rare dizzy spells    No syncope   Current Meds  Medication Sig  . allopurinol (ZYLOPRIM) 300 MG tablet Take 1 tablet (300 mg total) by mouth daily.  Marland Kitchen aspirin EC 325 MG tablet Take 1 tablet (325 mg total) by mouth 2 (two) times daily.  Marland Kitchen desonide (DESOWEN) 0.05 % lotion APPLY TO AFFECTED AREA  TWICE A DAY  . Hypromellose (ARTIFICIAL TEARS OP) Place 1 drop into both eyes daily as needed (dry eyes).  Marland Kitchen ibuprofen (ADVIL,MOTRIN) 600 MG tablet Take 1 tablet (600 mg total) by mouth every 6 (six) hours as needed.  Marland Kitchen losartan (COZAAR) 50 MG tablet Take 50 mg by mouth daily.  . metoprolol succinate (TOPROL-XL) 100 MG 24 hr tablet TAKE ONE TABLET BY MOUTH ONCE DAILY IMMEDIATELY FOLLOWING A MEAL  . oxycodone (OXY-IR) 5 MG capsule Take 5 mg by mouth every 4 (four) hours as needed.     Allergies:   Codeine sulfate and Sildenafil citrate   Past Medical History:  Diagnosis Date  . Allergic rhinitis   . Anemia in chronic kidney disease   . CKD (chronic kidney disease), stage III    NEPHROLOGIST-  DR Lavonia Dana  . Diabetes mellitus, type 2 (HCC)    diet controlled  . GERD (gastroesophageal reflux disease) 05/21/1978  . Gout 05/21/1985  . Hematuria   . History of kidney stones   . Hydronephrosis, right   . Hyperlipidemia 05/21/1988  . Hyperparathyroidism, secondary renal ([place])   . Hypertension 05/21/1978  . Migraines    Better after retirement  . Renal cyst, left     Past  Surgical History:  Procedure Laterality Date  . CYSTOSCOPY W/ URETERAL STENT PLACEMENT Right 11/25/2012   Procedure: CYSTOSCOPY WITH RETROGRADE PYELOGRAM/URETERAL STENT PLACEMENT;  Surgeon: Hanley Ben, MD;  Location: [facility];  Service: Urology;  Laterality: Right;  . CYSTOSCOPY/RETROGRADE/URETEROSCOPY Right 02/10/2013   Procedure: CYSTOSCOPY/RETROGRADE/URETEROSCOPY Right stent removal  Procedure: Cysto, Right Retrograde, Right Stent Pull, Right Diagnostic Ureteroscopy;  Surgeon: Alexis Frock, MD;  Location: [facility];  Service: Urology;  Laterality: Right;  . GASTRIC ROUX-EN-Y N/A 03/01/2014   Procedure: LAPAROSCOPIC ROUX-EN-Y GASTRIC BYPASS WITH UPPER ENDOSCOPY;  Surgeon: Kaylyn Lim, MD;  Location: [facility];  Service: General;  Laterality: N/A;  . LASIK    . ORIF ANKLE FRACTURE Left 11/14/2016   Procedure: OPEN REDUCTION INTERNAL FIXATION (ORIF) LEFT TRIMALLEOLAR ANKLE FRACTURE;  Surgeon: Leandrew Koyanagi, MD;  Location: [facility];  Service: Orthopedics;  Laterality: Left;  . PARTIAL NEPHRECTOMY  12/19/1977   RIGHT PYELOPLASTY AND removal benign cyst and stones     Social History:  The patient  reports that he quit smoking about 39 years ago. He quit after 6.00 years of use. He has never used smokeless tobacco. He reports that he does not drink  alcohol or use drugs.   Family History:  The patient's family history includes Cancer in his father; Diabetes in his maternal grandfather, mother, and sister; Heart disease in his maternal grandfather and mother; Hypertension in his mother.    ROS:  Please see the history of present illness. All other systems are reviewed and  Negative to the above problem except as noted.    PHYSICAL EXAM: VS:  BP 138/86   Pulse 63   Ht 5\' 11"  (1.803 m)   Wt 106.1 kg (233 lb 12.8 oz)   SpO2 97%   BMI 32.61 kg/m   GEN: Obese 61 yo  in no acute distress  HEENT: normal  Neck: no JVD, carotid bruits, or  masses Cardiac: RRR; no murmurs, rubs, or gallops,no edema  Respiratory:  clear to auscultation bilaterally, normal work of breathing GI: soft, nontender, nondistended, + BS  No hepatomegaly  MS: no deformity Moving all extremities   Skin: warm and dry, no rash Neuro:  Strength and sensation are intact Psych: euthymic mood, full affect   EKG:  EKG is ordered today.  On 6/21:  SB      Lipid Panel    Component Value Date/Time   CHOL 210 (H) 03/22/2016 0846   TRIG 288.0 (H) 03/22/2016 0846   HDL 45.50 03/22/2016 0846   CHOLHDL 5 03/22/2016 0846   VLDL 57.6 (H) 03/22/2016 0846   LDLCALC 68 12/09/2014 1033   LDLDIRECT 127.0 03/22/2016 0846      Wt Readings from Last 3 Encounters:  11/28/16 106.1 kg (233 lb 12.8 oz)  11/14/16 107.5 kg (237 lb)  11/08/16 106.1 kg (234 lb)      ASSESSMENT AND PLAN:  1  CP  Atypical  Resolved  I do not think cardiac  2  HL  Pt with evid of atherosclerosis of aorta  REviewed with hiim  I would recomm aggressive lowering of LDL  Recomm Crestor 20   Lipids in 8 wks Recomm f/u in 8 wk  3  PVOD  Aortic atheroscclerosis  81 ASA daily  4  HTN  PT says it is usually better 120/80  FOllow  Continue meds   PRN follow up here  I will f/u on lipids but then can return to Dr Phillip Heal    Current medicines are reviewed at length with the patient today.  The patient does not have concerns regarding medicines.  Signed, Dorris Carnes, MD  11/28/2016 9:53 AM    [address], [place], [place]  [address] Phone: [phone]; Fax: [phone]

## 2016-11-27 NOTE — Progress Notes (Signed)
Patient is 2 weeks status post ORIF left trimalleolar ankle fracture. He is overall doing well. He does have moderate swelling and bruising. His incisions have healed without evidence of infection. He has good strong pulses. Calf is nontender. The sutures were removed. Cam walker was provided. Continue nonweightbearing with knee scooter. May discontinue aspirin use. Follow-up in 4 weeks with 3 view x-rays left ankle.

## 2016-11-28 ENCOUNTER — Ambulatory Visit (INDEPENDENT_AMBULATORY_CARE_PROVIDER_SITE_OTHER): Payer: No Typology Code available for payment source | Admitting: Internal Medicine

## 2016-11-28 ENCOUNTER — Encounter: Payer: Self-pay | Admitting: Internal Medicine

## 2016-11-28 VITALS — BP 138/86 | HR 63 | Ht 71.0 in | Wt 233.8 lb

## 2016-11-28 DIAGNOSIS — I700 Atherosclerosis of aorta: Secondary | ICD-10-CM | POA: Diagnosis not present

## 2016-11-28 DIAGNOSIS — E782 Mixed hyperlipidemia: Secondary | ICD-10-CM

## 2016-11-28 DIAGNOSIS — R072 Precordial pain: Secondary | ICD-10-CM | POA: Diagnosis not present

## 2016-11-28 DIAGNOSIS — I10 Essential (primary) hypertension: Secondary | ICD-10-CM

## 2016-11-28 MED ORDER — ASPIRIN EC 81 MG PO TBEC: 81.0000 mg | DELAYED_RELEASE_TABLET | Freq: Every day | ORAL | 3 refills | Status: DC

## 2016-11-28 MED ORDER — ROSUVASTATIN CALCIUM 20 MG PO TABS: 20.0000 mg | ORAL_TABLET | Freq: Every day | ORAL | 1 refills | Status: DC

## 2016-11-28 NOTE — Patient Instructions (Addendum)
Your physician has recommended you make the following change in your medication:  1.) start rosuvastatin (Crestor) 20 mg once a day for cholesterol  Your physician recommends that you return for lab work in: 8 weeks (lipids, ast)  Your physician recommends that you schedule a follow-up appointment as needed with Dr. Harrington Challenger.   Addendum: pt plans to have lab work at PCP office.  PCP will follow LIPIDS long term.

## 2016-11-30 ENCOUNTER — Ambulatory Visit: Payer: No Typology Code available for payment source | Admitting: Internal Medicine

## 2016-12-20 ENCOUNTER — Telehealth (INDEPENDENT_AMBULATORY_CARE_PROVIDER_SITE_OTHER): Payer: Self-pay | Admitting: Orthopaedic Surgery

## 2016-12-20 ENCOUNTER — Other Ambulatory Visit (INDEPENDENT_AMBULATORY_CARE_PROVIDER_SITE_OTHER): Payer: Self-pay | Admitting: Physician Assistant

## 2016-12-20 MED ORDER — OXYCODONE HCL 5 MG PO CAPS: 5.0000 mg | ORAL_CAPSULE | ORAL | 0 refills | Status: DC | PRN

## 2016-12-20 NOTE — Telephone Encounter (Signed)
I called patient to advise that's the Narcotics cannot be sent via fax or e-prescribed. States what is he suppose to do w/o any medicine. He will be coming tomorrow.   I advised since Dr Erlinda Hong is out of the office this PM that I would send this message to another provider to see if they can advise and prescribe him something but they are seeing patients at this time and may be inbetwen patients or after clinic,  once they were done seeing patients. Advised patient that Dr Erlinda Hong will be back on Monday.  Patient was very frustrated and speaking very loud on the phone saying that he cannot believe no one took a message he has been calling since yesterday and that he spoke with [facility] yesterday around 9ish and nothing has been done. I apologized and I told him that I was not aware of this until today  2:13pm  when the message was taken.   Gwinda Passe Had a Voicemail in the Triage phone as well and advised me as well and that exactly when I called patient.   Patient is very upset and states he would like to  Speak to someone tomorrow when he comes in to pick up Rx.     Artis Delay: Can you please advise on message and prescribe him something, if possible thanks.  Eric: Juluis Rainier

## 2016-12-20 NOTE — Telephone Encounter (Signed)
PT ST[place] SOMETHING TO BE ABLE TO GIVE PT REFILL [place]STEAD OF [place], PT STATED IT IS URGENT THAT WE FAX THIS BACK.  PLEASE ADVISE   [phone]

## 2016-12-28 ENCOUNTER — Ambulatory Visit (INDEPENDENT_AMBULATORY_CARE_PROVIDER_SITE_OTHER): Payer: No Typology Code available for payment source | Admitting: Orthopaedic Surgery

## 2016-12-28 ENCOUNTER — Ambulatory Visit (INDEPENDENT_AMBULATORY_CARE_PROVIDER_SITE_OTHER): Payer: No Typology Code available for payment source

## 2016-12-28 ENCOUNTER — Encounter (INDEPENDENT_AMBULATORY_CARE_PROVIDER_SITE_OTHER): Payer: Self-pay | Admitting: Orthopaedic Surgery

## 2016-12-28 DIAGNOSIS — S82852D Displaced trimalleolar fracture of left lower leg, subsequent encounter for closed fracture with routine healing: Secondary | ICD-10-CM

## 2016-12-28 NOTE — Progress Notes (Signed)
Patient is 6 weeks status post ORIF left trimalleolar ankle fracture. He is doing well. Pain controlled. Surgical scars are well-healed.  We'll start physical therapy for gait training and balance and strengthening. Weight-bear as tolerated. 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 Follow-up in 6 weeks with 3 view x-rays of the left ankle.

## 2016-12-31 ENCOUNTER — Telehealth (INDEPENDENT_AMBULATORY_CARE_PROVIDER_SITE_OTHER): Payer: Self-pay | Admitting: Orthopaedic Surgery

## 2016-12-31 ENCOUNTER — Other Ambulatory Visit (INDEPENDENT_AMBULATORY_CARE_PROVIDER_SITE_OTHER): Payer: Self-pay | Admitting: Family

## 2016-12-31 MED ORDER — OXYCODONE HCL 5 MG PO CAPS: 5.0000 mg | ORAL_CAPSULE | Freq: Four times a day (QID) | ORAL | 0 refills | Status: DC | PRN

## 2016-12-31 NOTE — Telephone Encounter (Signed)
Rx and note ready for pick up at the front desk. Called patient to advise.  Patient aware.

## 2016-12-31 NOTE — Telephone Encounter (Signed)
Patient called asking for a refill on oxycodone. Also wanting to have a note for being out of work, wanted to be picked up with RX. CB # [phone]

## 2016-12-31 NOTE — Telephone Encounter (Signed)
Could you advise on message since Dr Erlinda Hong is out of the office. Please and thank you.

## 2017-01-09 ENCOUNTER — Telehealth (INDEPENDENT_AMBULATORY_CARE_PROVIDER_SITE_OTHER): Payer: Self-pay | Admitting: Orthopaedic Surgery

## 2017-01-09 NOTE — Telephone Encounter (Signed)
Yes

## 2017-01-09 NOTE — Telephone Encounter (Signed)
Patient called needing Rx refilled (Oxycodone) The number to contact patient is [phone]

## 2017-01-09 NOTE — Telephone Encounter (Signed)
Is this okay?

## 2017-01-09 NOTE — Telephone Encounter (Signed)
He can have norco #14.  1 tab po daily prn.  Can't have oxy, it's been too long since surgery

## 2017-01-09 NOTE — Telephone Encounter (Signed)
Please advise 

## 2017-01-10 MED ORDER — HYDROCODONE-ACETAMINOPHEN 5-325 MG PO TABS: ORAL_TABLET | ORAL | 0 refills | Status: DC

## 2017-01-10 NOTE — Telephone Encounter (Signed)
rx ready for pickup 

## 2017-01-10 NOTE — Telephone Encounter (Signed)
Rx ready for pick up. 

## 2017-01-10 NOTE — Addendum Note (Signed)
Addended by: Precious Bard on: 01/10/2017 10:00 AM   Modules accepted: Orders

## 2017-01-22 ENCOUNTER — Telehealth (INDEPENDENT_AMBULATORY_CARE_PROVIDER_SITE_OTHER): Payer: Self-pay | Admitting: Orthopaedic Surgery

## 2017-01-22 NOTE — Telephone Encounter (Signed)
20

## 2017-01-22 NOTE — Telephone Encounter (Signed)
Patient called needing Rx refilled (Hydrocodone) The number to contact patient is [phone]

## 2017-01-22 NOTE — Telephone Encounter (Signed)
Please advise 

## 2017-01-24 MED ORDER — HYDROCODONE-ACETAMINOPHEN 5-325 MG PO TABS: ORAL_TABLET | ORAL | 0 refills | Status: DC

## 2017-01-24 NOTE — Telephone Encounter (Signed)
rx printed

## 2017-01-24 NOTE — Telephone Encounter (Signed)
Called patient to let him know Rx ready for pick up at the front desk.

## 2017-01-28 DIAGNOSIS — R519 Headache, unspecified: Secondary | ICD-10-CM | POA: Insufficient documentation

## 2017-02-05 ENCOUNTER — Encounter (INDEPENDENT_AMBULATORY_CARE_PROVIDER_SITE_OTHER): Payer: Self-pay | Admitting: Orthopaedic Surgery

## 2017-02-05 ENCOUNTER — Ambulatory Visit (INDEPENDENT_AMBULATORY_CARE_PROVIDER_SITE_OTHER): Payer: No Typology Code available for payment source | Admitting: Orthopaedic Surgery

## 2017-02-05 ENCOUNTER — Ambulatory Visit (INDEPENDENT_AMBULATORY_CARE_PROVIDER_SITE_OTHER): Payer: No Typology Code available for payment source

## 2017-02-05 DIAGNOSIS — S82852D Displaced trimalleolar fracture of left lower leg, subsequent encounter for closed fracture with routine healing: Secondary | ICD-10-CM

## 2017-02-05 NOTE — Progress Notes (Signed)
Patient is almost 3 months status post ORIF left trimalleolar ankle fracture. He continues to physical therapy is making progress. He is overall doing well with some occasional pain and swelling. He is ambulating without any assistive devices. He would like to return to work in a couple weeks.  Physical exam shows fully healed surgical scars. Some mild swelling on the medial aspect. He has good range of motion and good strength. Ambulates without a normal pes. X-ray show stable fixation and healed fractures.  From my standpoint I want him to continue with physical therapy. I'll see him back in 2 months for final checkup. I think is fine for him to return back to work at [employer]. He may need rest breaks every 1-2 hours after prolonged standing. No x-rays needed on return.

## 2017-02-08 ENCOUNTER — Other Ambulatory Visit: Payer: Self-pay | Admitting: Family Medicine

## 2017-02-15 ENCOUNTER — Ambulatory Visit (INDEPENDENT_AMBULATORY_CARE_PROVIDER_SITE_OTHER): Payer: No Typology Code available for payment source | Admitting: Orthopaedic Surgery

## 2017-04-09 ENCOUNTER — Ambulatory Visit (INDEPENDENT_AMBULATORY_CARE_PROVIDER_SITE_OTHER): Payer: No Typology Code available for payment source | Admitting: Orthopaedic Surgery

## 2017-04-09 ENCOUNTER — Encounter (INDEPENDENT_AMBULATORY_CARE_PROVIDER_SITE_OTHER): Payer: Self-pay | Admitting: Orthopaedic Surgery

## 2017-04-09 DIAGNOSIS — S82852D Displaced trimalleolar fracture of left lower leg, subsequent encounter for closed fracture with routine healing: Secondary | ICD-10-CM

## 2017-04-09 NOTE — Progress Notes (Signed)
Office Visit Note   Patient: Luis Joseph           Date of Birth: 09-Mar-1956           MRN: [id number] Visit Date: 04/09/2017              Requested by: Tonia Ghent, MD [address] [place],  [address] PCP: Tonia Ghent, MD   Assessment & Plan: Visit Diagnoses:  1. Closed displaced trimalleolar fracture of left ankle with routine healing, subsequent encounter     Plan: Impression is healed trimalleolar ankle fracture.  Patient has reached [place] and doing quite well.  He is released from my standpoint.  Follow-up as needed.  Follow-Up Instructions: Return if symptoms worsen or fail to improve.   Orders:  No orders of the defined types were placed in this encounter.  No orders of the defined types were placed in this encounter.     Procedures: No procedures performed   Clinical Data: No additional findings.   Subjective: Chief Complaint  Patient presents with  . Left Ankle - Follow-up    Patient is 5 months status post ORIF bimalleolar ankle fracture is doing well overall, he complains of some mild discomfort and swelling at the end of the day but overall he is without any real complaints.    Review of Systems   Objective: Vital Signs: There were no vitals taken for this visit.  Physical Exam  Ortho Exam Left ankle exam shows fully healed surgical scars without any signs of infection.  Minimal swelling.  Good range of motion and strength. Specialty Comments:  No specialty comments available.  Imaging: No results found.   PMFS History: Patient Active Problem List   Diagnosis Date Noted  . Closed displaced trimalleolar fracture of left ankle 11/08/2016  . Chest discomfort 10/10/2016  . Abdominal pain 01/11/2016  . 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 Abnormal x-ray 10/20/2013  . Paresthesia 10/20/2013  . Renal stone 04/24/2013  . Routine general medical examination at a health care facility 09/29/2012  .  Elevated serum creatinine 09/29/2012  . Migraine headache 08/04/2012  . Erectile dysfunction 07/25/2011  . Postnasal drip 07/25/2011  . Muscle cramp, nocturnal 07/25/2011  . Vertigo 07/25/2011  . FATIGUE 06/06/2010  . Diabetes mellitus without complication ([place]) 62/13/0865  . SKIN LESION 03/17/2010  . DERMATITIS 10/20/2009  . ANXIETY 06/30/2008  . UNSPECIFIED SLEEP DISTURBANCE 06/10/2008  . OBESITY, UNSPECIFIED 04/21/2007  . HLD (hyperlipidemia) 05/21/1988  . Gout 05/21/1985  . Essential hypertension 05/21/1978  . GERD 05/21/1978  . HEADACHE, CLUSTER 05/21/1976   Past Medical History:  Diagnosis Date  . Allergic rhinitis   . Anemia in chronic kidney disease   . CKD (chronic kidney disease), stage III [facility]    NEPHROLOGIST-  DR Lavonia Dana  . Diabetes mellitus, type 2 (HCC)    diet controlled  . GERD (gastroesophageal reflux disease) 05/21/1978  . Gout 05/21/1985  . Hematuria   . History of kidney stones   . Hydronephrosis, right   . Hyperlipidemia 05/21/1988  . Hyperparathyroidism, secondary renal ([place])   . Hypertension 05/21/1978  . Migraines    Better after retirement  . Renal cyst, left     Family History  Problem Relation Age of Onset  . Heart disease Mother   . Diabetes Mother   . Hypertension Mother   . Cancer Father        Lung  . Diabetes  Sister   . Heart disease Maternal Grandfather   . Diabetes Maternal Grandfather   . Stroke Neg Hx   . Prostate cancer Neg Hx   . Colon cancer Neg Hx     Past Surgical History:  Procedure Laterality Date  . CYSTOSCOPY W/ URETERAL STENT PLACEMENT Right 11/25/2012   Procedure: CYSTOSCOPY WITH RETROGRADE PYELOGRAM/URETERAL STENT PLACEMENT;  Surgeon: Hanley Ben, MD;  Location: [facility];  Service: Urology;  Laterality: Right;  . CYSTOSCOPY/RETROGRADE/URETEROSCOPY Right 02/10/2013   Procedure: CYSTOSCOPY/RETROGRADE/URETEROSCOPY Right stent removal  Procedure: Cysto, Right Retrograde, Right Stent Pull,  Right Diagnostic Ureteroscopy;  Surgeon: Alexis Frock, MD;  Location: [facility];  Service: Urology;  Laterality: Right;  . GASTRIC ROUX-EN-Y N/A 03/01/2014   Procedure: LAPAROSCOPIC ROUX-EN-Y GASTRIC BYPASS WITH UPPER ENDOSCOPY;  Surgeon: Kaylyn Lim, MD;  Location[facility];  Service: General;  Laterality: N/A;  . LASIK    . ORIF ANKLE FRACTURE Left 11/14/2016   Procedure: OPEN REDUCTION INTERNAL FIXATION (ORIF) LEFT TRIMALLEOLAR ANKLE FRACTURE;  Surgeon: Leandrew Koyanagi, MD;  Location: [facility];  Service: Orthopedics;  Laterality: Left;  . PARTIAL NEPHRECTOMY  12/19/1977   RIGHT PYELOPLASTY AND removal benign cyst and stones   Social History   Occupational History  . Occupation: Retired from [employer]  Tobacco Use  . Smoking status: Former Smoker    Years: 6.00    Last attempt to quit: 11/17/1977    Years since quitting: 39.4  . Smokeless tobacco: Never Used  Substance and Sexual Activity  . Alcohol use: No    Alcohol/week: 0.0 oz  . Drug use: No  . Sexual activity: Yes

## 2017-04-23 ENCOUNTER — Encounter: Payer: Self-pay | Admitting: Podiatry

## 2017-04-23 ENCOUNTER — Ambulatory Visit (INDEPENDENT_AMBULATORY_CARE_PROVIDER_SITE_OTHER): Payer: No Typology Code available for payment source | Admitting: Podiatry

## 2017-04-23 VITALS — BP 160/102 | HR 53

## 2017-04-23 DIAGNOSIS — E1142 Type 2 diabetes mellitus with diabetic polyneuropathy: Secondary | ICD-10-CM | POA: Diagnosis not present

## 2017-04-23 NOTE — Patient Instructions (Signed)

## 2017-04-23 NOTE — Progress Notes (Signed)
   Subjective:    Patient ID: Luis Joseph, male    DOB: Aug 18, 1955, 61 y.o.   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 Patient states that his at work hours have increased during in the past 4-6 weeks require him to work additional hours. Patient says that he has changed his shoes and generally is becoming more comfortable over time. He also states that he is lost some additional weight in the past for 6 weeks which further reduces foot fatigue. Patient relates gastric bypass surgery with approximately 100 pound weight reduction from the surgery, however, with some additional weight since his surgery. Patient relates left ankle fracture summer 2018 with complete healing and no residual discomfort Patient is diabetic, however, has not required any medications as weight loss Patient complains of bilateral foot numbness on and off weightbearing Patient denies claudication Patient former smoker   Review of Systems  All other systems reviewed and are negative.      Objective:   Physical Exam  Patient estimates current weighted 5 feet 11 inches approximate 210 pounds  Vascular: DP pulses 2/4 bilaterally PT pulses 2/4 bilaterally Reflex within normal limits bilaterally  Neurological: Sensation to 10 g monofilament wire intact 8/8 bilaterally Vibratory sensation reactive bilaterally Ankle reflex reactive bilaterally  Dermatological: No open skin lesions bilaterally Well-healed surgical incisions medial lateral left ankle Atrophic shoe with absent hair growth bilaterally  Musculoskeletal: Moderate/severe HAV right  no restriction ankle, subtalar, midtarsal joints bilaterally Manual motor testing dorsi flexion, plantar flexion, inversion, eversion 5/5 bilaterally      Assessment & Plan:   Assessment: 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 I informed him that he does have some symptoms of neuropathy however, minimal. I do not think the neuropathy needs active treatment. As general foot fatigue is improving I do not think he needs any custom orthotics at this time.  Patient will return when necessary

## 2017-05-04 ENCOUNTER — Other Ambulatory Visit: Payer: Self-pay | Admitting: Family Medicine

## 2017-05-08 ENCOUNTER — Other Ambulatory Visit: Payer: Self-pay | Admitting: Family Medicine

## 2017-06-14 ENCOUNTER — Encounter: Payer: Self-pay | Admitting: *Deleted

## 2017-06-14 ENCOUNTER — Other Ambulatory Visit: Payer: Self-pay | Admitting: Family Medicine

## 2017-06-14 NOTE — Telephone Encounter (Signed)
Electronic refill request. Allopurinol Last office visit:   10/09/16 Last Filled:    30 tablet 0 05/08/2017  Please advise.

## 2017-06-15 ENCOUNTER — Other Ambulatory Visit: Payer: Self-pay | Admitting: Internal Medicine

## 2017-06-15 DIAGNOSIS — I700 Atherosclerosis of aorta: Secondary | ICD-10-CM

## 2017-06-25 ENCOUNTER — Other Ambulatory Visit: Payer: Self-pay | Admitting: Family Medicine

## 2017-07-09 ENCOUNTER — Encounter: Payer: No Typology Code available for payment source | Admitting: Family Medicine

## 2017-07-09 ENCOUNTER — Telehealth: Payer: Self-pay | Admitting: Family Medicine

## 2017-07-09 NOTE — Telephone Encounter (Signed)
See below crm  No show charge?  Copied from [place]. Topic: Quick Communication - Appointment Cancellation >> Jul 09, 2017  9:36 AM Boyd Kerbs wrote: Patient called to cancel appointment scheduled for Today 9: 26 . Patient has not rescheduled their appointment.  He said in parking lot and has flat tire.. Canceled to go get it fixed. Told him of the late charge.  Route to department's PEC pool.

## 2017-07-09 NOTE — Telephone Encounter (Signed)
Reschedule when possible.  Don't charge him for the no show fee.  Thanks.

## 2017-07-10 NOTE — Telephone Encounter (Signed)
Left message asking pt to call office  °

## 2017-08-01 ENCOUNTER — Encounter: Payer: No Typology Code available for payment source | Admitting: Family Medicine

## 2017-08-06 ENCOUNTER — Ambulatory Visit (INDEPENDENT_AMBULATORY_CARE_PROVIDER_SITE_OTHER): Payer: No Typology Code available for payment source | Admitting: Family Medicine

## 2017-08-06 ENCOUNTER — Encounter: Payer: Self-pay | Admitting: Family Medicine

## 2017-08-06 VITALS — BP 132/82 | HR 80 | Temp 97.8°F | Ht 71.0 in | Wt 224.5 lb

## 2017-08-06 DIAGNOSIS — E785 Hyperlipidemia, unspecified: Secondary | ICD-10-CM

## 2017-08-06 DIAGNOSIS — I10 Essential (primary) hypertension: Secondary | ICD-10-CM

## 2017-08-06 DIAGNOSIS — I700 Atherosclerosis of aorta: Secondary | ICD-10-CM

## 2017-08-06 DIAGNOSIS — Z0000 Encounter for general adult medical examination without abnormal findings: Secondary | ICD-10-CM | POA: Diagnosis not present

## 2017-08-06 DIAGNOSIS — M109 Gout, unspecified: Secondary | ICD-10-CM

## 2017-08-06 DIAGNOSIS — Z1211 Encounter for screening for malignant neoplasm of colon: Secondary | ICD-10-CM

## 2017-08-06 DIAGNOSIS — R7989 Other specified abnormal findings of blood chemistry: Secondary | ICD-10-CM

## 2017-08-06 DIAGNOSIS — E119 Type 2 diabetes mellitus without complications: Secondary | ICD-10-CM

## 2017-08-06 DIAGNOSIS — Z7189 Other specified counseling: Secondary | ICD-10-CM

## 2017-08-06 NOTE — Progress Notes (Signed)
CPE- See plan.  Routine anticipatory guidance given to patient.  See health maintenance.  The possibility exists that previously documented standard health maintenance information may have been brought forward from a previous encounter into this note.  If needed, that same information has been updated to reflect the current situation based on today's encounter.    Tetanus 2013 PNA 2014 Flu done 03/2017.   Shingles d/w pt.   we don't have shingrix in stock, d/w pt.  D/w patient EB:RAXENMM for colon cancer screening, including IFOB vs. colonoscopy.  Risks and benefits of both were discussed and patient voiced understanding.  Pt elects for: IFOB.   Prostate cancer screening and PSA options (with potential risks and benefits of testing vs not testing) were discussed along with recent recs/guidelines.  He declined testing PSA at this point. Living will d/w pt. Wife designated if patient were incapacitated, then his daughter [name] if his wife were unavailable.  Diet and exercise and weight d/w pt.    CKD.  Due for labs.  Avoiding offending agents.    H/o Dm2.  Not on meds.  Due for labs.  Eye exam pending.    Gout.  Compliant.  No sx usually if on diet.  Due for labs.    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  Stiffness and variable discomfort.  He is putting up with it as is.    Hypertension:    Using medication without problems or lightheadedness: yes Chest pain with exertion:no Edema:no Short of breath:no  Elevated Cholesterol: Using medications without problems:yes Muscle aches: no Diet compliance: encouraged.  Exercise:encouraged Labs d/w pt.    PMH and SH reviewed  Meds, vitals, and allergies reviewed.   ROS: Per HPI.  Unless specifically indicated otherwise in HPI, the patient denies:  General: fever. Eyes: acute vision changes ENT: sore throat Cardiovascular: chest pain Respiratory: SOB GI: vomiting GU: dysuria Musculoskeletal: acute back pain Derm: acute  rash Neuro: acute motor dysfunction Psych: worsening mood Endocrine: polydipsia Heme: bleeding Allergy: hayfever  GEN: nad, alert and oriented HEENT: mucous membranes moist NECK: supple w/o LA CV: rrr. PULM: ctab, no inc wob ABD: soft, +bs EXT: no edema SKIN: no acute rash  Diabetic foot exam: Normal inspection No skin breakdown No calluses  Normal DP pulses Normal sensation to light touch and monofilament Nails normal

## 2017-08-06 NOTE — Patient Instructions (Signed)
Go to the lab on the way out.  We'll contact you with your lab report. Call about an eye exam.  I'll work on your refills after I see your labs.  Take care.  Glad to see you.

## 2017-08-07 LAB — LIPID PANEL
CHOL/HDL RATIO: 2.3 (calc) (ref <5.0)
CHOLESTEROL: 106 mg/dL (ref <200)
HDL: 47 mg/dL (ref >40)
LDL Cholesterol (Calc): 38 mg/dL (calc)
Non-HDL Cholesterol (Calc): 59 mg/dL (calc) (ref <130)
Triglycerides: 124 mg/dL (ref <150)

## 2017-08-07 LAB — COMPREHENSIVE METABOLIC PANEL
AG Ratio: 1.2 (calc) (ref 1.0–2.5)
ALT: 13 U/L (ref 9–46)
AST: 18 U/L (ref 10–35)
Albumin: 4.1 g/dL (ref 3.6–5.1)
Alkaline phosphatase (APISO): 112 U/L (ref 40–115)
BILIRUBIN TOTAL: 0.7 mg/dL (ref 0.2–1.2)
BUN/Creatinine Ratio: 16 (calc) (ref 6–22)
BUN: 35 mg/dL — AB (ref 7–25)
CALCIUM: 9.5 mg/dL (ref 8.6–10.3)
CO2: 24 mmol/L (ref 20–32)
Chloride: 108 mmol/L (ref 98–110)
Creat: 2.17 mg/dL — ABNORMAL HIGH (ref 0.70–1.25)
Globulin: 3.4 g/dL (calc) (ref 1.9–3.7)
Glucose, Bld: 138 mg/dL — ABNORMAL HIGH (ref 65–99)
Potassium: 4.7 mmol/L (ref 3.5–5.3)
SODIUM: 140 mmol/L (ref 135–146)
TOTAL PROTEIN: 7.5 g/dL (ref 6.1–8.1)

## 2017-08-07 LAB — VITAMIN B12: VITAMIN B 12: 787 pg/mL (ref 200–1100)

## 2017-08-07 LAB — URIC ACID: URIC ACID, SERUM: 5.1 mg/dL (ref 4.0–8.0)

## 2017-08-07 LAB — HEMOGLOBIN A1C
Hgb A1c MFr Bld: 6.8 % of total Hgb — ABNORMAL HIGH (ref <5.7)
Mean Plasma Glucose: 148 (calc)
eAG (mmol/L): 8.2 (calc)

## 2017-08-08 DIAGNOSIS — Z7189 Other specified counseling: Secondary | ICD-10-CM | POA: Insufficient documentation

## 2017-08-08 MED ORDER — METOPROLOL SUCCINATE ER 100 MG PO TB24: ORAL_TABLET | ORAL | 3 refills | Status: DC

## 2017-08-08 MED ORDER — ROSUVASTATIN CALCIUM 20 MG PO TABS: 20.0000 mg | ORAL_TABLET | Freq: Every day | ORAL | 3 refills | Status: DC

## 2017-08-08 MED ORDER — LOSARTAN POTASSIUM 50 MG PO TABS: 50.0000 mg | ORAL_TABLET | Freq: Every day | ORAL | 3 refills | Status: DC

## 2017-08-08 MED ORDER — ALLOPURINOL 300 MG PO TABS: 300.0000 mg | ORAL_TABLET | Freq: Every day | ORAL | 3 refills | Status: DC

## 2017-08-08 NOTE — Assessment & Plan Note (Signed)
See notes on labs.  No change in meds at this point.   

## 2017-08-08 NOTE — Assessment & Plan Note (Signed)
Living will d/w pt. Wife designated if patient were incapacitated, then his daughter Stephanie if his wife were unavailable.  

## 2017-08-08 NOTE — Assessment & Plan Note (Signed)
See notes on labs.  Continue work on diet and exercise.  No change in meds at this point.

## 2017-08-08 NOTE — Assessment & Plan Note (Signed)
Tetanus 2013 PNA 2014 Flu done 03/2017.   Shingles d/w pt.   we don't have shingrix in stock, d/w pt.  D/w patient JW:TGRMBOB for colon cancer screening, including IFOB vs. colonoscopy.  Risks and benefits of both were discussed and patient voiced understanding.  Pt elects for: IFOB.   Prostate cancer screening and PSA options (with potential risks and benefits of testing vs not testing) were discussed along with recent recs/guidelines.  He declined testing PSA at this point. Living will d/w pt. Wife designated if patient were incapacitated, then his daughter [name] if his wife were unavailable.  Diet and exercise and weight d/w pt.

## 2017-08-12 ENCOUNTER — Encounter: Payer: Self-pay | Admitting: *Deleted

## 2017-09-20 ENCOUNTER — Encounter (HOSPITAL_COMMUNITY): Payer: Self-pay

## 2018-02-06 ENCOUNTER — Other Ambulatory Visit: Payer: Self-pay | Admitting: Family Medicine

## 2018-02-06 ENCOUNTER — Other Ambulatory Visit (INDEPENDENT_AMBULATORY_CARE_PROVIDER_SITE_OTHER): Payer: No Typology Code available for payment source

## 2018-02-06 DIAGNOSIS — M109 Gout, unspecified: Secondary | ICD-10-CM

## 2018-02-06 DIAGNOSIS — E119 Type 2 diabetes mellitus without complications: Secondary | ICD-10-CM

## 2018-02-06 NOTE — Addendum Note (Signed)
Addended by: Ellamae Sia on: 02/06/2018 07:34 AM   Modules accepted: Orders

## 2018-02-06 NOTE — Addendum Note (Signed)
Addended by: Ellamae Sia on: 02/06/2018 08:29 AM   Modules accepted: Orders

## 2018-02-07 LAB — BASIC METABOLIC PANEL
BUN/Creatinine Ratio: 20 (calc) (ref 6–22)
BUN: 44 mg/dL — ABNORMAL HIGH (ref 7–25)
CALCIUM: 9.5 mg/dL (ref 8.6–10.3)
CO2: 19 mmol/L — AB (ref 20–32)
Chloride: 108 mmol/L (ref 98–110)
Creat: 2.16 mg/dL — ABNORMAL HIGH (ref 0.70–1.25)
Glucose, Bld: 159 mg/dL — ABNORMAL HIGH (ref 65–99)
POTASSIUM: 4.8 mmol/L (ref 3.5–5.3)
SODIUM: 140 mmol/L (ref 135–146)

## 2018-02-07 LAB — HEMOGLOBIN A1C
Hgb A1c MFr Bld: 8 % of total Hgb — ABNORMAL HIGH (ref <5.7)
Mean Plasma Glucose: 183 (calc)
eAG (mmol/L): 10.1 (calc)

## 2018-02-07 LAB — URIC ACID: Uric Acid, Serum: 5.1 mg/dL (ref 4.0–8.0)

## 2018-02-11 ENCOUNTER — Ambulatory Visit (INDEPENDENT_AMBULATORY_CARE_PROVIDER_SITE_OTHER): Payer: No Typology Code available for payment source | Admitting: Family Medicine

## 2018-02-11 ENCOUNTER — Encounter: Payer: Self-pay | Admitting: Family Medicine

## 2018-02-11 VITALS — BP 112/70 | HR 62 | Temp 98.6°F | Ht 71.0 in | Wt 228.0 lb

## 2018-02-11 DIAGNOSIS — R0982 Postnasal drip: Secondary | ICD-10-CM

## 2018-02-11 DIAGNOSIS — N189 Chronic kidney disease, unspecified: Secondary | ICD-10-CM

## 2018-02-11 DIAGNOSIS — M109 Gout, unspecified: Secondary | ICD-10-CM | POA: Diagnosis not present

## 2018-02-11 DIAGNOSIS — E119 Type 2 diabetes mellitus without complications: Secondary | ICD-10-CM

## 2018-02-11 DIAGNOSIS — M25529 Pain in unspecified elbow: Secondary | ICD-10-CM | POA: Diagnosis not present

## 2018-02-11 DIAGNOSIS — E785 Hyperlipidemia, unspecified: Secondary | ICD-10-CM

## 2018-02-11 MED ORDER — FLUTICASONE PROPIONATE 50 MCG/ACT NA SUSP: 2.0000 | Freq: Every day | NASAL | 1 refills | Status: DC

## 2018-02-11 NOTE — Progress Notes (Signed)
Diabetes:  No meds Hypoglycemic episodes: no sx Hyperglycemic episodes: no sx Feet problems: no  Blood Sugars averaging: not checked.  eye exam within last year: due, d/w pt.  He'll call about that.  A1c up, d/w pt.   Weight up some in the meantime.  D/w pt about diet and exercise.   See avs.   CKD.  Cr. 2.16, similar to prev from 6 months ago.  Not on nsaids. Labs d/w pt.  Has renal clinic f/u pending.  On ARB.  Compliant.   Gout. Uric acid stable.  On allopurinol 300mg .  Complaint. Labs d/w pt.   R elbow pain.  H/o elbow injury years ago playing softball.  Pain with ROM now.  Lateral epicondyle area pain.  Pain pulling boxing and lifting at work.    He has some hand aches.  Unclear if from statin.  D/w pt.    Some recent sinus congestion and phlegm.  No fevers.  He has sig environmental temp changes at work, in and out of the cooler repeatedly.  Frequently throat clearing.  Failed claritin, mucinex.  He hasn't tried flonase.  D/w pt.    Flu shot to be done at work tomorrow.    PMH and SH reviewed Meds, vitals, and allergies reviewed.   ROS: Per HPI unless specifically indicated in ROS section   GEN: nad, alert and oriented HEENT: mucous membranes moist, TM wnl, nasal exam slightly stuff.   NECK: supple w/o LA CV: rrr. PULM: ctab, no inc wob ABD: soft, +bs EXT: no edema SKIN: no acute rash Pain with R elbow ROM, flex and ext and resisted pronation.  Tender at the R lateral epicondyle but not medially.  No olecranon pain.  Distally nv intact.  No bruising, no erythema, no swelling at the elbow.

## 2018-02-11 NOTE — Assessment & Plan Note (Signed)
Would try icing and tennis elbow strap and see sports med clinic if not better.  D/w pt.  He agrees.  He has repetitive motions at work and this may contribute.  Wouldn't hold statin yet. >25 minutes spent in face to face time with patient, >50% spent in counselling or coordination of care, discussing elbow pain, gout, Dm2, etc.

## 2018-02-11 NOTE — Assessment & Plan Note (Signed)
Can try flonase and update me as needed.  I wouldn't expect this med to have sig effect on his sugar.  D/w pt.

## 2018-02-11 NOTE — Assessment & Plan Note (Signed)
He has some hand aches.  Unclear if from statin.  D/w pt.  Would not yet hold statin, see avs.  He agrees.

## 2018-02-11 NOTE — Assessment & Plan Note (Signed)
A1c up, d/w pt.   Weight up some in the meantime.  D/w pt about diet and exercise.   See avs.  He wants to work more on diet and recheck in 3 months.  This is reasonable.

## 2018-02-11 NOTE — Assessment & Plan Note (Signed)
Cr. 2.16, similar to prev from 6 months ago.  Not on nsaids. Labs d/w pt.  Has renal clinic f/u pending.  On ARB.  Compliant. Continue as is.  He agrees.  Avoid nsaids.

## 2018-02-11 NOTE — Assessment & Plan Note (Signed)
Uric acid stable.  On allopurinol 300mg .  Complaint. Labs d/w pt.

## 2018-02-11 NOTE — Patient Instructions (Addendum)
Recheck in 3 months.  The only lab you need to have done for your next diabetic visit is an A1c.  We can do this with a fingerstick test at the office visit.  You do not need a lab visit ahead of time for this.  It does not matter if you are fasting when the lab is done. Get back on your diet in the meantime.   Cut back on soda.    Use a tennis elbow brace, ice the sore area, and ask about seeing Dr. Lorelei Pont if not better.   Unclear if you'll need to try coming off the crestor and a trial re: the joint aches.    Try flonase and see if that helps the nasal symptoms.  2 sprays per nostril per day.   Take care.  Glad to see you.

## 2018-02-18 ENCOUNTER — Other Ambulatory Visit: Payer: Self-pay | Admitting: Family Medicine

## 2018-02-18 NOTE — Telephone Encounter (Signed)
Electronic refill request. [facility]   Pharmacy comment: Alternative Requested:THIS DRUG IS ON MANUFACTURE RECALL,.PLEASE CHANGE TO ALTERNATIVE.

## 2018-02-19 NOTE — Telephone Encounter (Signed)
Okay to change from losartan to telmisartan.  40 mg of telmisartan should be equivalent dose.  If he is having difficulty controlling his blood pressure on the new medication then let me know.  Thanks.

## 2018-02-19 NOTE — Telephone Encounter (Signed)
Left message on voicemail for patient to call back. 

## 2018-02-20 NOTE — Telephone Encounter (Deleted)
I think you might have misread my previous message. telmisartan (MICARDIS) 40 MG tablet        Sig: Take 1 tablet (40 mg total) by mouth daily.   Original sig: Please specify directions, refills and quantity   Disp:  90 tablet  Refills:  1 (Pharmacy requested: 0)   Start: 02/19/2018   Class: Normal   Authorized by: Tonia Ghent, MD   Non-formulary   Pharmacy comment: Alternative Requested:THIS DRUG IS ON MANUFACTURE RECALL,.PLEASE CHANGE TO ALTERNATIVE.      To be filled at: [facility] [place], [place] - [address]

## 2018-03-07 ENCOUNTER — Other Ambulatory Visit: Payer: Self-pay | Admitting: Family Medicine

## 2018-04-03 ENCOUNTER — Encounter: Payer: Self-pay | Admitting: Family Medicine

## 2018-04-03 LAB — LAB REPORT - SCANNED
BUN: 48 — AB (ref 4–21)
CREATININE: 2.01
Hemoglobin: 13.3
PLATELETS: 177
Potassium: 4.7
Uric Acid: 4.5

## 2018-04-25 ENCOUNTER — Other Ambulatory Visit: Payer: Self-pay | Admitting: Family Medicine

## 2018-04-30 ENCOUNTER — Telehealth: Payer: Self-pay | Admitting: Family Medicine

## 2018-04-30 NOTE — Telephone Encounter (Signed)
Left message asking pt to call office  Please let pt know his appointment time on 05/27/17 with Dr Damita Dunnings has been changed to 8:30 due to a meeting that morning

## 2018-05-27 ENCOUNTER — Encounter: Payer: Self-pay | Admitting: Family Medicine

## 2018-05-27 ENCOUNTER — Ambulatory Visit: Payer: No Typology Code available for payment source | Admitting: Family Medicine

## 2018-05-27 ENCOUNTER — Ambulatory Visit (INDEPENDENT_AMBULATORY_CARE_PROVIDER_SITE_OTHER): Payer: No Typology Code available for payment source | Admitting: Family Medicine

## 2018-05-27 VITALS — BP 128/84 | HR 64 | Temp 97.5°F | Ht 71.0 in | Wt 222.5 lb

## 2018-05-27 DIAGNOSIS — R109 Unspecified abdominal pain: Secondary | ICD-10-CM

## 2018-05-27 DIAGNOSIS — E119 Type 2 diabetes mellitus without complications: Secondary | ICD-10-CM | POA: Diagnosis not present

## 2018-05-27 DIAGNOSIS — Z1211 Encounter for screening for malignant neoplasm of colon: Secondary | ICD-10-CM

## 2018-05-27 LAB — POCT GLYCOSYLATED HEMOGLOBIN (HGB A1C): Hemoglobin A1C: 7.5 % — AB (ref 4.0–5.6)

## 2018-05-27 NOTE — Progress Notes (Signed)
Diabetes:  No meds.   Hypoglycemic episodes: rare sx at work and corrected with a snack, cautions d/w pt.  He keep snack nearby.  He has awareness of sx.   Hyperglycemic episodes: no sx Feet problems: occ/rare L 1st toe numbness.  Not consistent.   Blood Sugars averaging: not checked.   eye exam within last year: yes, 04/2018.   A1c improved to 7.5.  D/w pt.   He is cutting carbs and working on diet.   Intentional weight loss.   Flu shot done at work 02/2018  He also has some L upper lip numbness and L thumb numbness.  No consistent sx.  They don't happen at the same time.  All going on for years and not worse. Reasonable to defer w/u for now.  No other focal neuro sx.    Throat clearing improved with prn flonase and mucinex.    D/w patient [id number] for colon cancer screening, including IFOB vs. colonoscopy.  Risks and benefits of both were discussed and patient voiced understanding.  Pt elects for: ifob.     He had some early AM abd pain that self resolved, noted a few times in the last few weeks.  No sx in the last week.  He'll observe.  No vomiting, jaundice.  He had some diarrhea once, about 2 nights ago and that may have been diet related.    He is still putting up with joint pain, that may be worse with work/overuse.    Meds, vitals, and allergies reviewed.   ROS: Per HPI unless specifically indicated in ROS section   GEN: nad, alert and oriented HEENT: mucous membranes moist NECK: supple w/o LA CV: rrr. PULM: ctab, no inc wob ABD: soft, +bs EXT: no edema SKIN: no acute rash

## 2018-05-27 NOTE — Patient Instructions (Addendum)
Check to see what meter is covered and let us know.  Check with your insurance to see if they will cover shingrix.  Thanks for your effort.   Go to the lab on the way out.  We'll contact you with your lab report. Recheck in about 3 months with a physical, labs ahead of time.

## 2018-05-28 DIAGNOSIS — Z1211 Encounter for screening for malignant neoplasm of colon: Secondary | ICD-10-CM | POA: Insufficient documentation

## 2018-05-28 NOTE — Assessment & Plan Note (Signed)
D/w patient Luis Joseph for colon cancer screening, including IFOB vs. colonoscopy.  Risks and benefits of both were discussed and patient voiced understanding.  Pt elects for: ifob.

## 2018-05-28 NOTE — Assessment & Plan Note (Signed)
A1c improved to 7.5.  D/w pt.   He is cutting carbs and working on diet.   Intentional weight loss.  Continue work on diet and exercise.  No change in meds at this point.  Recheck periodically.  He agrees.

## 2018-05-28 NOTE — Assessment & Plan Note (Signed)
No symptoms now.  He will observe and update me as needed.

## 2018-07-11 ENCOUNTER — Other Ambulatory Visit: Payer: Self-pay | Admitting: *Deleted

## 2018-07-11 ENCOUNTER — Telehealth: Payer: Self-pay | Admitting: Family Medicine

## 2018-07-11 NOTE — Telephone Encounter (Signed)
Patient requests refill on Desonide Last office visit:   05/27/2018 Last Filled:    118 mL 0 04/03/2016  Please advise.

## 2018-07-11 NOTE — Telephone Encounter (Signed)
Pt called office requesting a refill on the Desonide. Pt uses [facility] on [address].

## 2018-07-11 NOTE — Telephone Encounter (Signed)
Request sent to Dr. Damita Dunnings.

## 2018-07-13 MED ORDER — DESONIDE 0.05 % EX LOTN: TOPICAL_LOTION | CUTANEOUS | 0 refills | Status: DC

## 2018-07-13 NOTE — Telephone Encounter (Signed)
Sent. Thanks.   
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
## 2018-07-16 IMAGING — DX DG ANKLE 2V *L*
2 series · 2 of 2 positions shown · non-contrast
Comparison: Left tibia and fibula of today's date

CLINICAL DATA: The patient walked off a trailer and tripped and
twisted the left ankle.

EXAM:
LEFT ANKLE - 2 VIEW

[ankle ap]
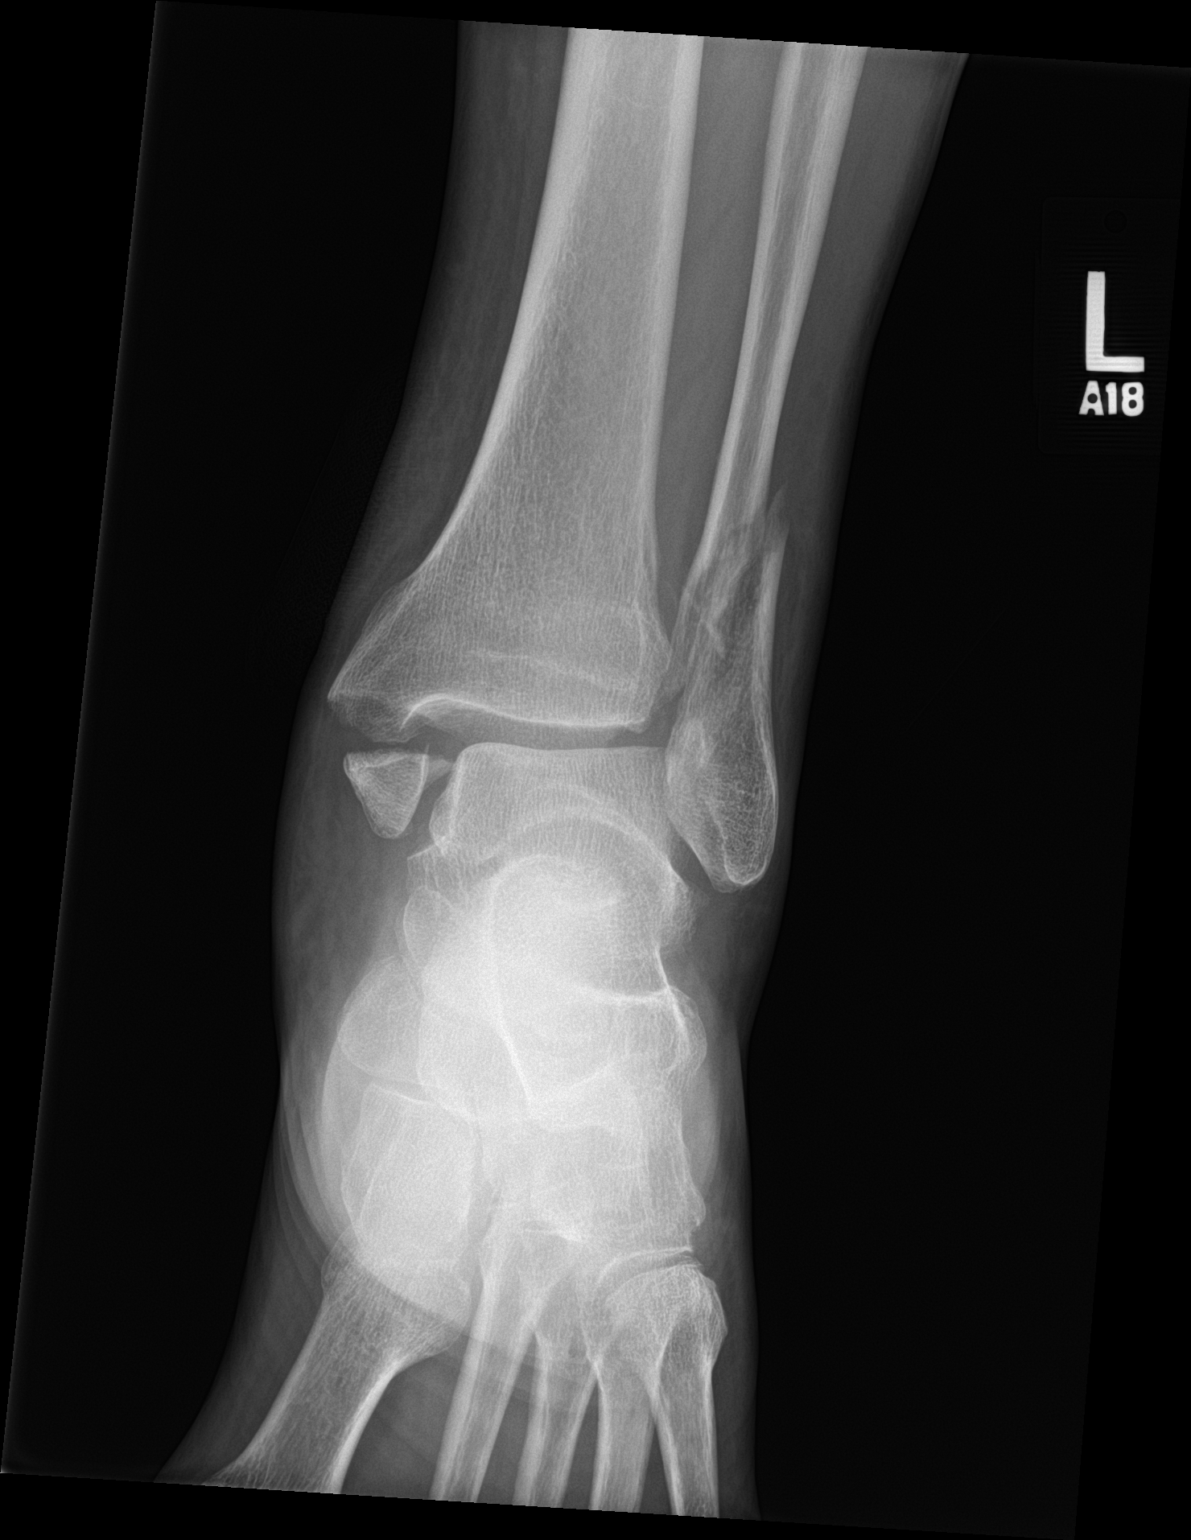

[ankle lat]
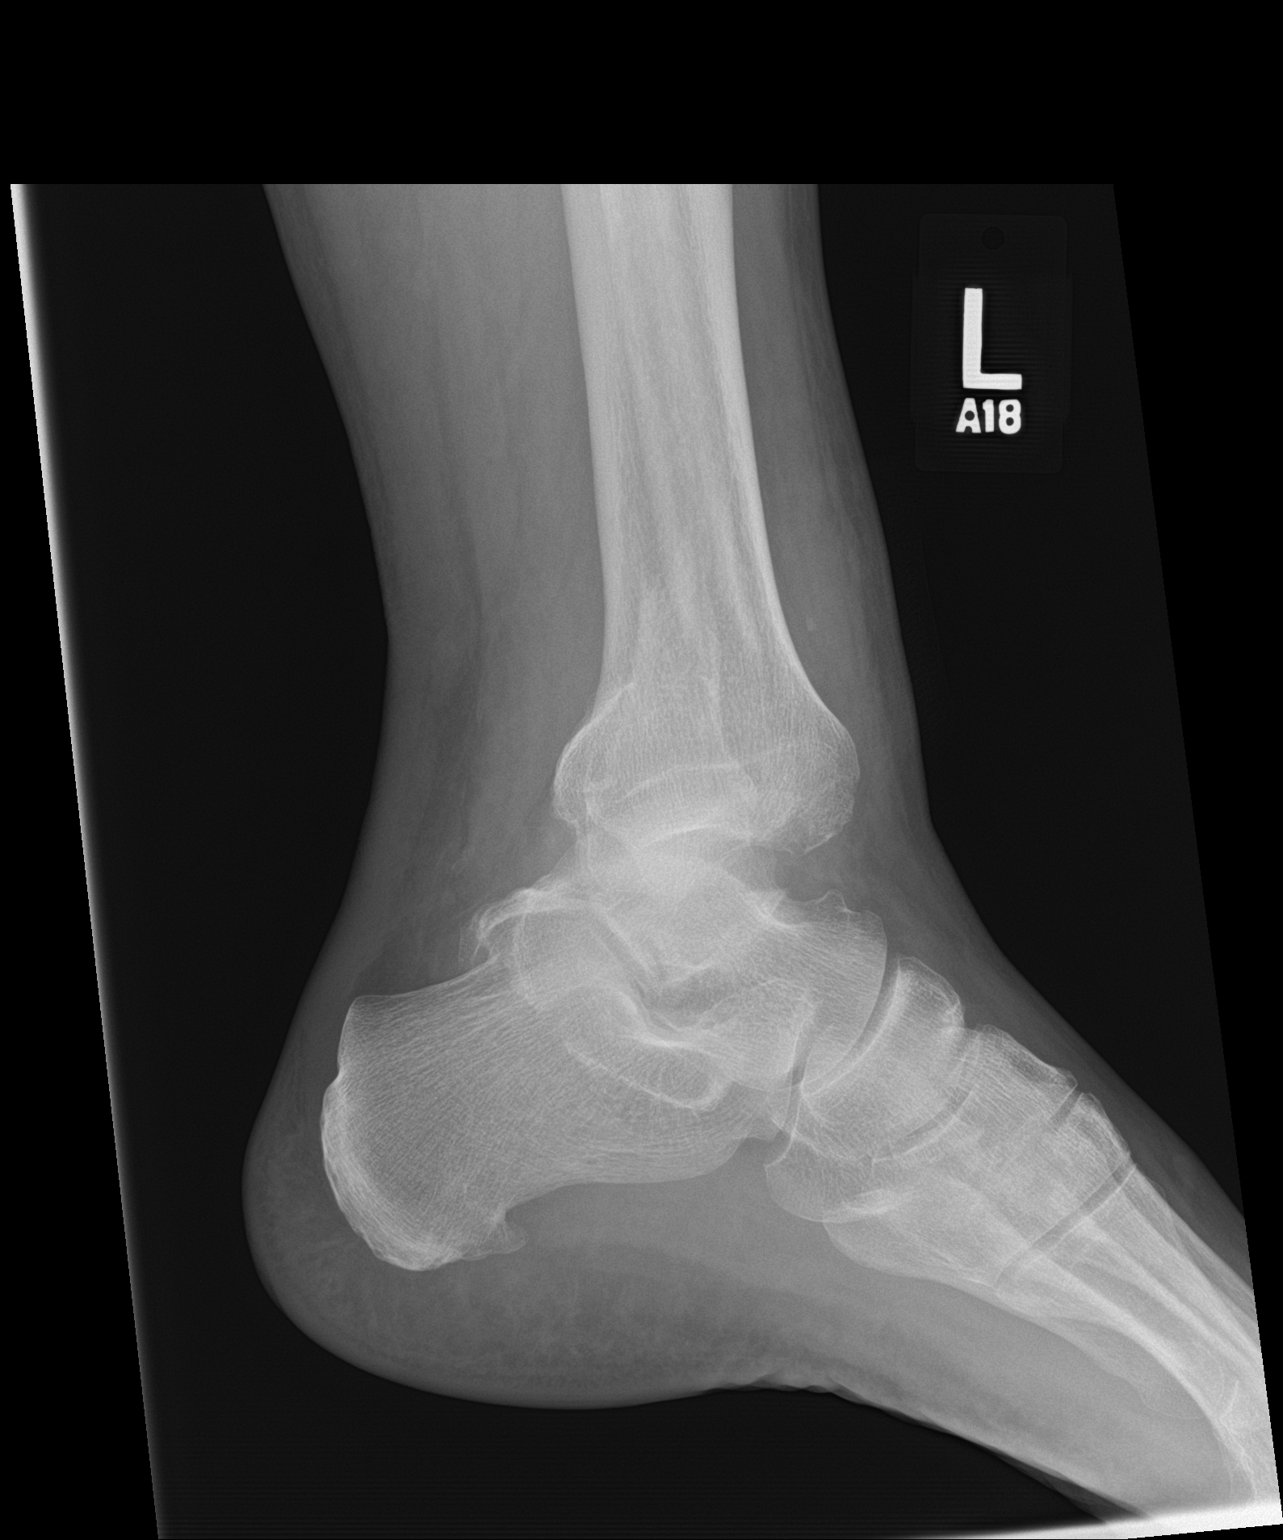

[2 of 2 positions shown; findings below may reference images not displayed]

FINDINGS: The patient has sustained an acute transversely oriented fracture of
the medial malleolus. There is a spiral fracture of the distal
fibular metadiaphysis with mild angulation apex anterior. There is
disruption of the ankle joint mortise. There may be a posterior
malleolar fracture but this is not well demonstrated. The talar dome
and remainder of the talus appear intact. The calcaneus is intact.
There is a plantar calcaneal spur. There is mild diffuse soft tissue
swelling.
IMPRESSION: The patient has sustained an acute bimalleolar fracture dislocation
of the left ankle. I cannot absolutely exclude a posterior malleolar
fracture as well.

## 2018-07-29 ENCOUNTER — Other Ambulatory Visit: Payer: Self-pay | Admitting: Family Medicine

## 2018-08-13 ENCOUNTER — Other Ambulatory Visit: Payer: Self-pay | Admitting: Family Medicine

## 2018-09-02 ENCOUNTER — Other Ambulatory Visit: Payer: No Typology Code available for payment source

## 2018-09-09 ENCOUNTER — Encounter: Payer: No Typology Code available for payment source | Admitting: Family Medicine

## 2018-09-23 ENCOUNTER — Other Ambulatory Visit: Payer: Self-pay | Admitting: Family Medicine

## 2018-09-23 DIAGNOSIS — I10 Essential (primary) hypertension: Secondary | ICD-10-CM

## 2018-09-23 DIAGNOSIS — E119 Type 2 diabetes mellitus without complications: Secondary | ICD-10-CM

## 2018-09-23 DIAGNOSIS — E785 Hyperlipidemia, unspecified: Secondary | ICD-10-CM

## 2018-09-23 DIAGNOSIS — Z9884 Bariatric surgery status: Secondary | ICD-10-CM

## 2018-09-23 DIAGNOSIS — M109 Gout, unspecified: Secondary | ICD-10-CM

## 2018-09-27 ENCOUNTER — Other Ambulatory Visit: Payer: Self-pay | Admitting: Family Medicine

## 2018-09-27 DIAGNOSIS — I700 Atherosclerosis of aorta: Secondary | ICD-10-CM

## 2018-10-21 ENCOUNTER — Other Ambulatory Visit: Payer: No Typology Code available for payment source

## 2018-10-28 ENCOUNTER — Encounter: Payer: No Typology Code available for payment source | Admitting: Family Medicine

## 2018-11-05 ENCOUNTER — Telehealth: Payer: Self-pay

## 2018-11-05 NOTE — Telephone Encounter (Signed)
Left detailed VM w COVID screen and back door lab info   

## 2018-11-07 ENCOUNTER — Other Ambulatory Visit (INDEPENDENT_AMBULATORY_CARE_PROVIDER_SITE_OTHER): Payer: No Typology Code available for payment source

## 2018-11-07 ENCOUNTER — Other Ambulatory Visit: Payer: Self-pay

## 2018-11-07 DIAGNOSIS — Z1211 Encounter for screening for malignant neoplasm of colon: Secondary | ICD-10-CM | POA: Diagnosis not present

## 2018-11-07 DIAGNOSIS — I10 Essential (primary) hypertension: Secondary | ICD-10-CM

## 2018-11-07 DIAGNOSIS — M109 Gout, unspecified: Secondary | ICD-10-CM

## 2018-11-07 DIAGNOSIS — E119 Type 2 diabetes mellitus without complications: Secondary | ICD-10-CM

## 2018-11-07 DIAGNOSIS — Z9884 Bariatric surgery status: Secondary | ICD-10-CM

## 2018-11-07 NOTE — Addendum Note (Signed)
Addended by: Ellamae Sia on: 11/07/2018 08:17 AM   Modules accepted: Orders

## 2018-11-08 LAB — LIPID PANEL
Cholesterol: 107 mg/dL (ref <200)
HDL: 51 mg/dL (ref >=40)
LDL Cholesterol (Calc): 41 mg/dL (calc)
Non-HDL Cholesterol (Calc): 56 mg/dL (calc) (ref <130)
Total CHOL/HDL Ratio: 2.1 (calc) (ref <5.0)
Triglycerides: 66 mg/dL (ref <150)

## 2018-11-08 LAB — COMPREHENSIVE METABOLIC PANEL
AG Ratio: 1.5 (calc) (ref 1.0–2.5)
ALT: 23 U/L (ref 9–46)
AST: 18 U/L (ref 10–35)
Albumin: 3.8 g/dL (ref 3.6–5.1)
Alkaline phosphatase (APISO): 101 U/L (ref 35–144)
BUN/Creatinine Ratio: 27 (calc) — ABNORMAL HIGH (ref 6–22)
BUN: 50 mg/dL — ABNORMAL HIGH (ref 7–25)
CO2: 18 mmol/L — ABNORMAL LOW (ref 20–32)
Calcium: 8.9 mg/dL (ref 8.6–10.3)
Chloride: 111 mmol/L — ABNORMAL HIGH (ref 98–110)
Creat: 1.86 mg/dL — ABNORMAL HIGH (ref 0.70–1.25)
Globulin: 2.6 g/dL (calc) (ref 1.9–3.7)
Glucose, Bld: 113 mg/dL — ABNORMAL HIGH (ref 65–99)
Potassium: 4.8 mmol/L (ref 3.5–5.3)
Sodium: 140 mmol/L (ref 135–146)
Total Bilirubin: 0.5 mg/dL (ref 0.2–1.2)
Total Protein: 6.4 g/dL (ref 6.1–8.1)

## 2018-11-08 LAB — URIC ACID: Uric Acid, Serum: 4.2 mg/dL (ref 4.0–8.0)

## 2018-11-08 LAB — HEMOGLOBIN A1C
Hgb A1c MFr Bld: 6.7 % of total Hgb — ABNORMAL HIGH (ref <5.7)
Mean Plasma Glucose: 146 (calc)
eAG (mmol/L): 8.1 (calc)

## 2018-11-08 LAB — SPECIMEN COMPROMISED

## 2018-11-08 LAB — VITAMIN B12: Vitamin B-12: 1323 pg/mL — ABNORMAL HIGH (ref 200–1100)

## 2018-11-11 ENCOUNTER — Ambulatory Visit (INDEPENDENT_AMBULATORY_CARE_PROVIDER_SITE_OTHER): Payer: No Typology Code available for payment source | Admitting: Family Medicine

## 2018-11-11 ENCOUNTER — Encounter: Payer: Self-pay | Admitting: Family Medicine

## 2018-11-11 ENCOUNTER — Other Ambulatory Visit: Payer: Self-pay

## 2018-11-11 VITALS — BP 120/84 | HR 65 | Temp 98.3°F | Ht 69.25 in | Wt 219.0 lb

## 2018-11-11 DIAGNOSIS — Z1211 Encounter for screening for malignant neoplasm of colon: Secondary | ICD-10-CM

## 2018-11-11 DIAGNOSIS — I10 Essential (primary) hypertension: Secondary | ICD-10-CM

## 2018-11-11 DIAGNOSIS — Z0000 Encounter for general adult medical examination without abnormal findings: Secondary | ICD-10-CM | POA: Diagnosis not present

## 2018-11-11 DIAGNOSIS — M25529 Pain in unspecified elbow: Secondary | ICD-10-CM

## 2018-11-11 DIAGNOSIS — Z7189 Other specified counseling: Secondary | ICD-10-CM

## 2018-11-11 DIAGNOSIS — N189 Chronic kidney disease, unspecified: Secondary | ICD-10-CM

## 2018-11-11 DIAGNOSIS — E119 Type 2 diabetes mellitus without complications: Secondary | ICD-10-CM

## 2018-11-11 MED ORDER — ALLOPURINOL 300 MG PO TABS: 300.0000 mg | ORAL_TABLET | Freq: Every day | ORAL | 3 refills | Status: DC

## 2018-11-11 NOTE — Patient Instructions (Signed)
Hold the crestor for 1 week and see if the joint aches get a lot better.  If not, then restart it.  If so, then let me know.  Take care.  Glad to see you.

## 2018-11-11 NOTE — Progress Notes (Signed)
CPE- See plan.  Routine anticipatory guidance given to patient.  See health maintenance.  The possibility exists that previously documented standard health maintenance information may have been brought forward from a previous encounter into this note.  If needed, that same information has been updated to reflect the current situation based on today's encounter.    Tetanus 2013 PNA 2014 Flu done yearly  Shingles d/w pt.  he may check on coverage next year- not covered 2020.   IFOB pending 2020.  Prostate cancer screening and PSA options(with potential risks and benefits of testing vs not testing) were discussed along with recent recs/guidelines. He declined testing PSAat this point. Living will d/w pt. Wife designated if patient were incapacitated, then his daughter [name] if his wife were unavailable.  Diet and exercise and weight d/w pt.   Pandemic considerations d/w pt.    Diabetes:  No meds.  Hypoglycemic episodes: no Hyperglycemic episodes: no Feet problems: minimal L foot numbness, episodically.   Blood Sugars averaging: not checked.   eye exam within last year: done at [facility] med clinic in [place], w/o retinopathy per patient report.   Labs d/w pt.  He cut out sodas.    occ medial R elbow pain, seems to be mechanical.  No help with tennis elbow strap.  Old softball injury.  Declined ortho eval now.  D/w pt.   Hypertension:    Using medication without problems or lightheadedness: yes Chest pain with exertion:no Edema:no Short of breath:no Labs d/w pt.    He is putting up with B hand arthritis.    Elevated Cholesterol: Using medications without problems: yes Muscle aches: d/w pt about holding statin to see if hand aches get better.   Diet compliance:yes Exercise:yes  CKD. He has routine renal f/u.  No nsaids.  Cr d/w pt.    PMH and SH reviewed  Meds, vitals, and allergies reviewed.   ROS: Per HPI.  Unless specifically indicated otherwise in HPI, the patient  denies:  General: fever. Eyes: acute vision changes ENT: sore throat Cardiovascular: chest pain Respiratory: SOB GI: vomiting GU: dysuria Musculoskeletal: acute back pain Derm: acute rash Neuro: acute motor dysfunction Psych: worsening mood Endocrine: polydipsia Heme: bleeding Allergy: hayfever  GEN: nad, alert and oriented HEENT: ncat NECK: supple w/o LA CV: rrr. PULM: ctab, no inc wob ABD: soft, +bs EXT: no edema SKIN: no acute rash Right elbow with normal range of motion and not tender to palpation.  Diabetic foot exam: Normal inspection No skin breakdown No calluses  Normal DP pulses Normal sensation to light touch and monofilament Nails normal   AP: CPE.  Tetanus 2013 PNA 2014 Flu done yearly  Shingles d/w pt.  he may check on coverage next year- not covered 2020.   IFOB pending 2020.  Prostate cancer screening and PSA options(with potential risks and benefits of testing vs not testing) were discussed along with recent recs/guidelines. He declined testing PSAat this point. Living will d/w pt. Wife designated if patient were incapacitated, then his daughter [name] if his wife were unavailable.  Diet and exercise and weight d/w pt.   Pandemic considerations d/w pt.    Diabetes:  No meds.  Labs d/w pt.  He cut out sodas.  No additional medication at this time.  Recheck periodically, in 6 months.  occ medial R elbow pain, seems to be mechanical.  No help with tennis elbow strap.  Old softball injury.  Declined ortho eval now.  D/w pt. he will update me  as needed.  Hypertension:    No change in meds. Labs d/w pt.    He is putting up with B hand arthritis.  See below by holding statin.  Elevated Cholesterol: Muscle aches: d/w pt about holding statin to see if hand aches get better.    CKD. He has routine renal f/u.  No nsaids.  Cr d/w pt.    See after visit summary.

## 2018-11-12 NOTE — Assessment & Plan Note (Signed)
Living will d/w pt. Wife designated if patient were incapacitated, then his daughter [name] if his wife were unavailable.

## 2018-11-12 NOTE — Assessment & Plan Note (Signed)
See above

## 2018-11-13 LAB — FECAL GLOBIN BY IMMUNOCHEMISTRY
FECAL GLOBIN RESULT:: NOT DETECTED
MICRO NUMBER:: 602948
SPECIMEN QUALITY:: ADEQUATE

## 2018-11-14 ENCOUNTER — Telehealth: Payer: Self-pay

## 2018-11-14 NOTE — Telephone Encounter (Signed)
Left message for patient to call back  

## 2018-11-24 ENCOUNTER — Telehealth: Payer: Self-pay | Admitting: Family Medicine

## 2018-11-24 NOTE — Telephone Encounter (Signed)
Lvm for pt to call us back and schedule appt    Message Received: 1 week ago  Message Contents  Tonia Ghent, MD  Genella Rife H        Needs recheck in 6 months with A1c prior to office visit. Thanks.

## 2019-02-04 ENCOUNTER — Other Ambulatory Visit: Payer: Self-pay | Admitting: Family Medicine

## 2019-02-05 NOTE — Telephone Encounter (Signed)
Electronic refill request. Desowen 0.05% Lotion Last office visit:   11/11/2018 CPE Last Filled:    118 mL 0 07/13/2018  Please advise.

## 2019-02-06 ENCOUNTER — Other Ambulatory Visit: Payer: Self-pay | Admitting: Family Medicine

## 2019-02-06 NOTE — Telephone Encounter (Signed)
Sent. Thanks.   

## 2019-02-11 DIAGNOSIS — N1832 Chronic kidney disease, stage 3b: Secondary | ICD-10-CM | POA: Insufficient documentation

## 2019-02-11 DIAGNOSIS — N2581 Secondary hyperparathyroidism of renal origin: Secondary | ICD-10-CM | POA: Insufficient documentation

## 2019-02-11 DIAGNOSIS — R809 Proteinuria, unspecified: Secondary | ICD-10-CM | POA: Insufficient documentation

## 2019-02-11 DIAGNOSIS — I129 Hypertensive chronic kidney disease with stage 1 through stage 4 chronic kidney disease, or unspecified chronic kidney disease: Secondary | ICD-10-CM | POA: Insufficient documentation

## 2019-02-22 ENCOUNTER — Encounter: Payer: Self-pay | Admitting: Family Medicine

## 2019-02-24 ENCOUNTER — Other Ambulatory Visit: Payer: Self-pay | Admitting: Family Medicine

## 2019-02-24 DIAGNOSIS — M25529 Pain in unspecified elbow: Secondary | ICD-10-CM

## 2019-04-18 ENCOUNTER — Encounter: Payer: Self-pay | Admitting: Family Medicine

## 2019-04-19 ENCOUNTER — Other Ambulatory Visit: Payer: Self-pay | Admitting: Family Medicine

## 2019-04-19 DIAGNOSIS — J3489 Other specified disorders of nose and nasal sinuses: Secondary | ICD-10-CM

## 2019-05-12 ENCOUNTER — Encounter (INDEPENDENT_AMBULATORY_CARE_PROVIDER_SITE_OTHER): Payer: Self-pay | Admitting: Otolaryngology

## 2019-05-12 ENCOUNTER — Ambulatory Visit (INDEPENDENT_AMBULATORY_CARE_PROVIDER_SITE_OTHER): Payer: Medicare Other | Admitting: Otolaryngology

## 2019-05-12 ENCOUNTER — Other Ambulatory Visit: Payer: Self-pay

## 2019-05-12 VITALS — Temp 98.1°F

## 2019-05-12 DIAGNOSIS — J310 Chronic rhinitis: Secondary | ICD-10-CM
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
## 2019-06-22 DEATH — deceased

## 2019-06-23 ENCOUNTER — Ambulatory Visit: Payer: No Typology Code available for payment source | Admitting: Family Medicine

## 2019-07-16 ENCOUNTER — Ambulatory Visit (INDEPENDENT_AMBULATORY_CARE_PROVIDER_SITE_OTHER): Payer: Medicare Other | Admitting: Family Medicine

## 2019-07-16 ENCOUNTER — Encounter: Payer: Self-pay | Admitting: Family Medicine

## 2019-07-16 ENCOUNTER — Other Ambulatory Visit: Payer: Self-pay

## 2019-07-16 VITALS — BP 132/80 | HR 72 | Temp 96.9°F | Ht 69.25 in | Wt 238.4 lb

## 2019-07-16 DIAGNOSIS — R21 Rash and other nonspecific skin eruption: Secondary | ICD-10-CM | POA: Diagnosis not present

## 2019-07-16 DIAGNOSIS — E119 Type 2 diabetes mellitus without complications: Secondary | ICD-10-CM | POA: Diagnosis not present

## 2019-07-16 DIAGNOSIS — R040 Epistaxis: Secondary | ICD-10-CM

## 2019-07-16 LAB — POCT GLYCOSYLATED HEMOGLOBIN (HGB A1C): Hemoglobin A1C: 8 % — AB (ref 4.0–5.6)

## 2019-07-16 MED ORDER — CLOTRIMAZOLE-BETAMETHASONE 1-0.05 % EX CREA: 1.0000 "application " | TOPICAL_CREAM | Freq: Two times a day (BID) | CUTANEOUS | 0 refills | Status: DC

## 2019-07-16 NOTE — Progress Notes (Signed)
This visit occurred during the SARS-CoV-2 public health emergency.  Safety protocols were in place, including screening questions prior to the visit, additional usage of staff PPE, and extensive cleaning of exam room while observing appropriate contact time as indicated for disinfecting solutions.  Aches improved off crestor.  D/w pt. he clearly felt better off medication.  CKD followed by renal clinic.  He is going to f/u with them.  D/w pt.   He is back at work in the meantime.    DM2.  Weight is up in the meantime.  A1c up some.  He had been off diet.  D/w pt about options.  No meds currently for sugar.  Still on ARB.  He expects that return to work will affect diet/exercise.  He expects exercise to improve with springtime weather.    He had itchy rash on B olecranon and gluteal crease.  Still with some lesions on the crease.  No residual lesions on the elbows.    He has covid vaccine pending.    He recently had 2 small nosebleeds recently.  Both on the left side.  No symptoms now.  He did not have prolonged bleeding.  No other bleeding.  Meds, vitals, and allergies reviewed.   ROS: Per HPI unless specifically indicated in ROS section   GEN: nad, alert and oriented HEENT: ncat, nasal epithelium within normal limits except for a punctate area with small amount of adherent coagulated blood on the septum anteriorly in the left nostril.  No ulceration.  No other lesions noted.  Right nostril with normal examination. NECK: supple w/o LA CV: rrr. PULM: ctab, no inc wob ABD: soft, +bs EXT: no edema SKIN: no acute rash but he does have a chronic area of mild irritation bilaterally in the upper gluteal crease.

## 2019-07-16 NOTE — Patient Instructions (Addendum)
Keep working on diet and exercise.  Recheck labs before a physical/yearly medicare visit in about 4 months.   Use lotrisone on the rash and let me know if that doesn't help.  Take care.  Glad to see you.

## 2019-07-19 ENCOUNTER — Ambulatory Visit: Payer: Medicare Other | Attending: Internal Medicine

## 2019-07-19 DIAGNOSIS — R040 Epistaxis: Secondary | ICD-10-CM | POA: Insufficient documentation

## 2019-07-19 DIAGNOSIS — Z23 Encounter for immunization: Secondary | ICD-10-CM | POA: Insufficient documentation

## 2019-07-19 NOTE — Assessment & Plan Note (Signed)
This could be benign superficial irritation.  He could have a mild fungal infection.  Start Lotrisone and update me as needed.  He agrees.

## 2019-07-19 NOTE — Assessment & Plan Note (Signed)
Recheck labs before a physical/yearly medicare visit in about 4 months.  He expects his numbers to improve with diet and exercise.  He will work harder on diet and exercise.  No change in meds at this point.

## 2019-07-19 NOTE — Progress Notes (Signed)
   Covid-19 Vaccination Clinic  Name:  Luis Joseph    MRN: [id number] DOB: March 16, 1956  07/19/2019  [name] was observed post Covid-19 immunization for 15 minutes without incidence. He was provided with Vaccine Information Sheet and instruction to access the V-Safe system.   [name] was instructed to call 911 with any severe reactions post vaccine: Marland Kitchen Difficulty breathing  . Swelling of your face and throat  . A fast heartbeat  . A bad rash all over your body  . Dizziness and weakness    Immunizations Administered    Name Date Dose VIS Date Route   Pfizer COVID-19 Vaccine 07/19/2019  1:52 PM 0.3 mL 05/01/2019 Intramuscular   Manufacturer: [place]   Lot: [id number]   [place]: [id number]

## 2019-07-19 NOTE — Assessment & Plan Note (Signed)
Minor, self-limited.  Routine cautions and instructions given to patient.  He will update me if recurrent.

## 2019-07-28 ENCOUNTER — Other Ambulatory Visit: Payer: Self-pay | Admitting: Family Medicine

## 2019-07-30 LAB — HM DIABETES EYE EXAM

## 2019-08-11 ENCOUNTER — Other Ambulatory Visit: Payer: Self-pay | Admitting: Family Medicine

## 2019-08-11 NOTE — Telephone Encounter (Signed)
Electronic refill request. Clotrimazole-Betamethasone Cream Last office visit:   07/16/2019 Last Filled:    30 g 0 07/16/2019  Please advise.

## 2019-08-12 NOTE — Telephone Encounter (Signed)
Left message on voicemail for patient to call back. 

## 2019-08-12 NOTE — Telephone Encounter (Signed)
I sent the refill in the meantime.  Please check with patient and make sure he was improving.  Thanks.

## 2019-08-18 ENCOUNTER — Ambulatory Visit: Payer: Medicare Other | Attending: Internal Medicine

## 2019-08-18 DIAGNOSIS — Z23 Encounter for immunization: Secondary | ICD-10-CM

## 2019-08-18 NOTE — Progress Notes (Signed)
   Covid-19 Vaccination Clinic  Name:  Luis Joseph    MRN: [id number] DOB: 28-Aug-1955  08/18/2019  [name] was observed post Covid-19 immunization for 15 minutes without incident. He was provided with Vaccine Information Sheet and instruction to access the V-Safe system.   [name] was instructed to call 911 with any severe reactions post vaccine: Marland Kitchen Difficulty breathing  . Swelling of face and throat  . A fast heartbeat  . A bad rash all over body  . Dizziness and weakness   Immunizations Administered    Name Date Dose VIS Date Route   Pfizer COVID-19 Vaccine 08/18/2019  2:45 PM 0.3 mL 05/01/2019 Intramuscular   Manufacturer: [employer], [employer]   Lot: [id number]   [place]: [id number]

## 2019-08-18 NOTE — Telephone Encounter (Signed)
Patient returned call Advised of Dr Carole Civil message and refill was sent. , patient stated that it is really improving.

## 2019-08-18 NOTE — Telephone Encounter (Signed)
Noted.  Thanks.  Glad to hear.  ? ?

## 2019-09-08 ENCOUNTER — Encounter: Payer: Self-pay | Admitting: Family Medicine

## 2019-09-11 ENCOUNTER — Other Ambulatory Visit: Payer: Self-pay | Admitting: Family Medicine

## 2019-09-11 MED ORDER — CLOTRIMAZOLE-BETAMETHASONE 1-0.05 % EX CREA: TOPICAL_CREAM | CUTANEOUS | 1 refills | Status: DC

## 2019-11-07 ENCOUNTER — Other Ambulatory Visit: Payer: Self-pay | Admitting: Family Medicine

## 2019-12-09 ENCOUNTER — Other Ambulatory Visit: Payer: Self-pay | Admitting: Family Medicine

## 2019-12-09 DIAGNOSIS — Z9884 Bariatric surgery status: Secondary | ICD-10-CM

## 2019-12-09 DIAGNOSIS — R202 Paresthesia of skin: Secondary | ICD-10-CM

## 2019-12-09 DIAGNOSIS — D518 Other vitamin B12 deficiency anemias: Secondary | ICD-10-CM

## 2019-12-09 DIAGNOSIS — R748 Abnormal levels of other serum enzymes: Secondary | ICD-10-CM

## 2019-12-09 DIAGNOSIS — M109 Gout, unspecified: Secondary | ICD-10-CM

## 2019-12-09 DIAGNOSIS — E119 Type 2 diabetes mellitus without complications: Secondary | ICD-10-CM

## 2019-12-14 ENCOUNTER — Encounter: Payer: Self-pay | Admitting: Family Medicine

## 2019-12-14 ENCOUNTER — Other Ambulatory Visit (INDEPENDENT_AMBULATORY_CARE_PROVIDER_SITE_OTHER): Payer: Medicare Other

## 2019-12-14 ENCOUNTER — Ambulatory Visit (INDEPENDENT_AMBULATORY_CARE_PROVIDER_SITE_OTHER): Payer: Medicare Other | Admitting: Family Medicine

## 2019-12-14 ENCOUNTER — Other Ambulatory Visit: Payer: Self-pay

## 2019-12-14 DIAGNOSIS — R202 Paresthesia of skin: Secondary | ICD-10-CM

## 2019-12-14 DIAGNOSIS — Z9884 Bariatric surgery status: Secondary | ICD-10-CM

## 2019-12-14 DIAGNOSIS — M109 Gout, unspecified: Secondary | ICD-10-CM

## 2019-12-14 DIAGNOSIS — N529 Male erectile dysfunction, unspecified: Secondary | ICD-10-CM

## 2019-12-14 DIAGNOSIS — R5383 Other fatigue: Secondary | ICD-10-CM

## 2019-12-14 DIAGNOSIS — E119 Type 2 diabetes mellitus without complications: Secondary | ICD-10-CM

## 2019-12-14 DIAGNOSIS — D649 Anemia, unspecified: Secondary | ICD-10-CM

## 2019-12-14 DIAGNOSIS — D619 Aplastic anemia, unspecified: Secondary | ICD-10-CM

## 2019-12-14 NOTE — Progress Notes (Signed)
This visit occurred during the SARS-CoV-2 public health emergency.  Safety protocols were in place, including screening questions prior to the visit, additional usage of staff PPE, and extensive cleaning of exam room while observing appropriate contact time as indicated for disinfecting solutions.  Gluteal crease irritation prev tx'd with lotrisone, with relief, then with sx returned.  Discussed options.  He can restart topical treatment.  Fatigue noted.  He wants to sleep when he gets home.  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  He has mild paresthesia in his fingers, L>R.  Longstanding per patient report.  Some in the B1st toes.  He has had ED "for awhile" w/o complete lack of function.  He isn't lightheaded.    He has kidney clinic f/u pending.    Sleep d/w pt.  Taking benadryl to sleep 3 times per night.  He gets about 3 hours of sleep per dose.  He isn't snoring.  Sleeping on side.  No known OSA but he has h/o post nasal gtt that is more noted when laying on his back.    He said some occasional bilateral hamstring cramping that happens at rest.  Is not exertional, not claudication.  Meds, vitals, and allergies reviewed.   ROS: Per HPI unless specifically indicated in ROS section   GEN: nad, alert and oriented HEENT: mucous membranes moist, nasal exam stuffy. NECK: supple w/o LA CV: rrr.  PULM: ctab, no inc wob ABD: soft, +bs EXT: no edema SKIN: Well-perfused. Mildly altered sensation in the left greater than right fingertips.

## 2019-12-14 NOTE — Addendum Note (Signed)
Addended by: Ellamae Sia on: 12/14/2019 07:34 AM   Modules accepted: Orders

## 2019-12-14 NOTE — Patient Instructions (Signed)
Let me get your labs back in the meantime and we'll see about options.  Take care.  Glad to see you.

## 2019-12-14 NOTE — Addendum Note (Signed)
Addended by: Cloyd Stagers on: 12/14/2019 05:28 PM   Modules accepted: Orders

## 2019-12-15 LAB — COMPREHENSIVE METABOLIC PANEL
AG Ratio: 1.3 (calc) (ref 1.0–2.5)
ALT: 12 U/L (ref 9–46)
AST: 15 U/L (ref 10–35)
Albumin: 3.7 g/dL (ref 3.6–5.1)
Alkaline phosphatase (APISO): 122 U/L (ref 35–144)
BUN/Creatinine Ratio: 21 (calc) (ref 6–22)
BUN: 46 mg/dL — ABNORMAL HIGH (ref 7–25)
CO2: 21 mmol/L (ref 20–32)
Calcium: 9 mg/dL (ref 8.6–10.3)
Chloride: 109 mmol/L (ref 98–110)
Creat: 2.16 mg/dL — ABNORMAL HIGH (ref 0.70–1.25)
Globulin: 2.8 g/dL (calc) (ref 1.9–3.7)
Glucose, Bld: 165 mg/dL — ABNORMAL HIGH (ref 65–99)
Potassium: 4.9 mmol/L (ref 3.5–5.3)
Sodium: 138 mmol/L (ref 135–146)
Total Bilirubin: 0.7 mg/dL (ref 0.2–1.2)
Total Protein: 6.5 g/dL (ref 6.1–8.1)

## 2019-12-15 LAB — CBC WITH DIFFERENTIAL/PLATELET
Absolute Monocytes: 474 cells/uL (ref 200–950)
Basophils Absolute: 72 cells/uL (ref 0–200)
Basophils Relative: 1.2 %
Eosinophils Absolute: 240 cells/uL (ref 15–500)
Eosinophils Relative: 4 %
HCT: 38.3 % — ABNORMAL LOW (ref 38.5–50.0)
Hemoglobin: 13.2 g/dL (ref 13.2–17.1)
Lymphs Abs: 1986 cells/uL (ref 850–3900)
MCH: 32.5 pg (ref 27.0–33.0)
MCHC: 34.5 g/dL (ref 32.0–36.0)
MCV: 94.3 fL (ref 80.0–100.0)
MPV: 11.8 fL (ref 7.5–12.5)
Monocytes Relative: 7.9 %
Neutro Abs: 3228 cells/uL (ref 1500–7800)
Neutrophils Relative %: 53.8 %
Platelets: 205 10*3/uL (ref 140–400)
RBC: 4.06 10*6/uL — ABNORMAL LOW (ref 4.20–5.80)
RDW: 13.7 % (ref 11.0–15.0)
Total Lymphocyte: 33.1 %
WBC: 6 10*3/uL (ref 3.8–10.8)

## 2019-12-15 LAB — LIPID PANEL
Cholesterol: 208 mg/dL — ABNORMAL HIGH (ref <200)
HDL: 43 mg/dL (ref >=40)
LDL Cholesterol (Calc): 133 mg/dL (calc) — ABNORMAL HIGH
Non-HDL Cholesterol (Calc): 165 mg/dL (calc) — ABNORMAL HIGH (ref <130)
Total CHOL/HDL Ratio: 4.8 (calc) (ref <5.0)
Triglycerides: 181 mg/dL — ABNORMAL HIGH (ref <150)

## 2019-12-15 LAB — URIC ACID: Uric Acid, Serum: 5.2 mg/dL (ref 4.0–8.0)

## 2019-12-15 LAB — HEMOGLOBIN A1C
Hgb A1c MFr Bld: 7.6 % of total Hgb — ABNORMAL HIGH (ref <5.7)
Mean Plasma Glucose: 171 (calc)
eAG (mmol/L): 9.5 (calc)

## 2019-12-15 LAB — VITAMIN B12: Vitamin B-12: 607 pg/mL (ref 200–1100)

## 2019-12-15 LAB — TESTOSTERONE: Testosterone: 544 ng/dL (ref 250–827)

## 2019-12-15 LAB — IRON: Iron: 133 ug/dL (ref 50–180)

## 2019-12-15 LAB — TSH: TSH: 2.67 mIU/L (ref 0.40–4.50)

## 2019-12-16 NOTE — Assessment & Plan Note (Signed)
With erectile dysfunction noted.  Unclear source.  Could be due to sleep disruption.  Postnasal drip could be affecting his sleep.  He had seen ENT previously.  Would hold off on treatment with medication like Astelin until we can get his routine labs back.  Differential diagnosis discussed with patient.  See orders.  He agrees.

## 2019-12-16 NOTE — Assessment & Plan Note (Signed)
We talked about getting routine labs done.  See orders.  He is coming back in for a physical in the near future and we will go over it at that point.

## 2019-12-21 ENCOUNTER — Ambulatory Visit: Payer: Medicare Other | Admitting: Family Medicine

## 2020-01-01 ENCOUNTER — Other Ambulatory Visit: Payer: Self-pay

## 2020-01-01 ENCOUNTER — Encounter: Payer: Self-pay | Admitting: Family Medicine

## 2020-01-01 ENCOUNTER — Ambulatory Visit (INDEPENDENT_AMBULATORY_CARE_PROVIDER_SITE_OTHER): Payer: Medicare Other | Admitting: Family Medicine

## 2020-01-01 VITALS — BP 128/76 | HR 72 | Temp 96.5°F | Ht 69.25 in | Wt 236.1 lb

## 2020-01-01 DIAGNOSIS — M19049 Primary osteoarthritis, unspecified hand: Secondary | ICD-10-CM

## 2020-01-01 DIAGNOSIS — Z1211 Encounter for screening for malignant neoplasm of colon: Secondary | ICD-10-CM

## 2020-01-01 DIAGNOSIS — E785 Hyperlipidemia, unspecified: Secondary | ICD-10-CM | POA: Diagnosis not present

## 2020-01-01 DIAGNOSIS — E119 Type 2 diabetes mellitus without complications: Secondary | ICD-10-CM

## 2020-01-01 DIAGNOSIS — N189 Chronic kidney disease, unspecified: Secondary | ICD-10-CM | POA: Diagnosis not present

## 2020-01-01 DIAGNOSIS — Z23 Encounter for immunization: Secondary | ICD-10-CM

## 2020-01-01 DIAGNOSIS — Z7189 Other specified counseling: Secondary | ICD-10-CM

## 2020-01-01 DIAGNOSIS — M109 Gout, unspecified: Secondary | ICD-10-CM

## 2020-01-01 DIAGNOSIS — Z0000 Encounter for general adult medical examination without abnormal findings: Secondary | ICD-10-CM

## 2020-01-01 MED ORDER — ALLOPURINOL 300 MG PO TABS: 300.0000 mg | ORAL_TABLET | Freq: Every day | ORAL | 3 refills | Status: DC

## 2020-01-01 MED ORDER — METOPROLOL SUCCINATE ER 100 MG PO TB24: ORAL_TABLET | ORAL | 3 refills | Status: DC

## 2020-01-01 MED ORDER — CLOTRIMAZOLE-BETAMETHASONE 1-0.05 % EX CREA: TOPICAL_CREAM | CUTANEOUS | 3 refills | Status: DC

## 2020-01-01 MED ORDER — TELMISARTAN 40 MG PO TABS: 40.0000 mg | ORAL_TABLET | Freq: Every day | ORAL | 3 refills | Status: DC

## 2020-01-01 NOTE — Patient Instructions (Signed)
Keep working on diet and exercise and recheck in about 6 months, sooner if needed.  A1c at the visit.  I'll await the notes from renal.  Take care.  Glad to see you.

## 2020-01-01 NOTE — Progress Notes (Signed)
This visit occurred during the SARS-CoV-2 public health emergency.  Safety protocols were in place, including screening questions prior to the visit, additional usage of staff PPE, and extensive cleaning of exam room while observing appropriate contact time as indicated for disinfecting solutions.  I have personally reviewed the Medicare Annual Wellness questionnaire and have noted 1. The patient's medical and social history 2. Their use of alcohol, tobacco or illicit drugs 3. Their current medications and supplements 4. The patient's functional ability including ADL's, fall risks, home safety risks and hearing or visual             impairment. 5. Diet and physical activities 6. Evidence for depression or mood disorders  The patients weight, height, BMI have been recorded in the chart and visual acuity is per eye clinic.  I have made referrals, counseling and provided education to the patient based review of the above and I have provided the pt with a written personalized care plan for preventive services.  Provider list updated- see scanned forms.  Routine anticipatory guidance given to patient.  See health maintenance. The possibility exists that previously documented standard health maintenance information may have been brought forward from a previous encounter into this note.  If needed, that same information has been updated to reflect the current situation based on today's encounter.    Flu yearly Shingles d/w pt.   PNA 2021 Tetanus 2013 covid vaccine done prev.   D/w patient JY:NWGNFAO for colon cancer screening, including IFOB vs. colonoscopy.  Risks and benefits of both were discussed and patient voiced understanding.  Pt elects for: cologuard.  Prostate cancer screening and PSA options (with potential risks and benefits of testing vs not testing) were discussed along with recent recs/guidelines.  He declined testing PSA at this point. Advance directive-Wife designated if patient were  incapacitated, then his daughter [name] if his wife were unavailable.  Cognitive function addressed- see scanned forms- and if abnormal then additional documentation follows.  AAA screening d/w pt.  Prev CT w/o AAA in 2017  Passed whisper hearing screen.  EKG already on chart.    CKD d/w pt.  Cr stable.  Avoiding nsaids.  Has renal f/u.   Diabetes:  No meds.   Hypoglycemic episodes: no sx Hyperglycemic episodes: no sx Feet problems: minimal numbness in the L foot Blood Sugars averaging: not checked.   eye exam within last year: yes, early March.  [facility] in [place].    Brother has prostate eval pending.   No recent cramping.  Fatigue d/w pt, some better in the meantime.    Bump on back.  Sore to touch.  Needs eval.  See exam below.    Hand arthritis.  Worse with cold exposures.  He is putting up with his sx and will update me as needed.     Rash improved with lotrisone.   Gout.  No flares.  Still on allopurinol.  Labs d/w pt.    Hypertension:    Using medication without problems or lightheadedness: yes Chest pain with exertion:no Edema:no Short of breath:no Labs d/w pt.    Myalgias on crestor, d/w pt.    PMH and SH reviewed  Meds, vitals, and allergies reviewed.   ROS: Per HPI.  Unless specifically indicated otherwise in HPI, the patient denies:  General: fever. Eyes: acute vision changes ENT: sore throat Cardiovascular: chest pain Respiratory: SOB GI: vomiting GU: dysuria Musculoskeletal: acute back pain Derm: acute rash Neuro: acute motor dysfunction Psych: worsening mood Endocrine: polydipsia  Heme: bleeding Allergy: hayfever  GEN: nad, alert and oriented HEENT: ncat NECK: supple w/o LA CV: rrr. PULM: ctab, no inc wob ABD: soft, +bs EXT: no edema SKIN: no acute rash but has an irritated open comedone on the back, he can treat with warm compresses.  Doesn't appear infected.  Not draining.  No spreading redness.  No fluctuant mass, no mass.   B  2nd finger with DIP OA changes noted.   Diabetic foot exam: Normal inspection No skin breakdown No calluses  Normal DP pulses Normal sensation to light touch and monofilament Nails normal

## 2020-01-07 DIAGNOSIS — M19049 Primary osteoarthritis, unspecified hand: Secondary | ICD-10-CM | POA: Insufficient documentation

## 2020-01-07 NOTE — Assessment & Plan Note (Addendum)
Flu yearly Shingles d/w pt.   PNA 2021 Tetanus 2013 covid vaccine done prev.   D/w patient EQ:ASTMHDQ for colon cancer screening, including IFOB vs. colonoscopy.  Risks and benefits of both were discussed and patient voiced understanding.  Pt elects for: cologuard.  Prostate cancer screening and PSA options (with potential risks and benefits of testing vs not testing) were discussed along with recent recs/guidelines.  He declined testing PSA at this point. Advance directive-Wife designated if patient were incapacitated, then his daughter [name] if his wife were unavailable.  Cognitive function addressed- see scanned forms- and if abnormal then additional documentation follows.  AAA screening d/w pt.  Prev CT w/o AAA in 2017

## 2020-01-07 NOTE — Assessment & Plan Note (Signed)
Continue diet and exercise.  Statin intolerant.  Had to stop crestor.

## 2020-01-07 NOTE — Assessment & Plan Note (Signed)
Continue as is.  No flares.  Still on allopurinol.  Labs d/w pt.

## 2020-01-07 NOTE — Assessment & Plan Note (Signed)
CKD d/w pt.  Cr stable.  Avoiding nsaids.  Has renal f/u.

## 2020-01-07 NOTE — Assessment & Plan Note (Signed)
Advance directive-Wife designated if patient were incapacitated, then his daughter [name] if his wife were unavailable.

## 2020-01-07 NOTE — Assessment & Plan Note (Signed)
Continue work on diet and exercise.  No change in meds. Plan on recheck in about 6 months, sooner if needed.  He agrees.

## 2020-01-07 NOTE — Assessment & Plan Note (Signed)
Worse with cold exposures.  He is putting up with his sx and will update me as needed.   Avoid nsaids.

## 2020-01-21 ENCOUNTER — Encounter: Payer: Self-pay | Admitting: Family Medicine

## 2020-01-21 LAB — COLOGUARD: Cologuard: POSITIVE — AB

## 2020-01-28 LAB — COLOGUARD: Cologuard: POSITIVE — AB

## 2020-02-02 ENCOUNTER — Other Ambulatory Visit: Payer: Self-pay | Admitting: Family Medicine

## 2020-02-02 NOTE — Telephone Encounter (Signed)
See previous note.  Patient has refills remaining on original prescription but the pharmacy can't fill it until 02/13/2020 unless a new Rx is sent in.  [place]13[place]20. [facility], [address]

## 2020-02-02 NOTE — Telephone Encounter (Signed)
Pt called stating he needs to get a refill on  Clotrimazole.  [place]  [facility] [address]

## 2020-02-03 MED ORDER — CLOTRIMAZOLE-BETAMETHASONE 1-0.05 % EX CREA
TOPICAL_CREAM | CUTANEOUS | 3 refills | Status: DC
Start: 2020-02-03 — End: 2020-05-12

## 2020-02-03 NOTE — Telephone Encounter (Signed)
Sent. Thanks.   

## 2020-02-07 ENCOUNTER — Telehealth: Payer: Self-pay | Admitting: Family Medicine

## 2020-02-07 DIAGNOSIS — R195 Other fecal abnormalities: Secondary | ICD-10-CM

## 2020-02-07 NOTE — Telephone Encounter (Signed)
Call patient.  Cologuard positive.  I put in the referral to GI.  I would like him to talk to the GI clinic.  Thanks.

## 2020-02-08 ENCOUNTER — Encounter: Payer: Self-pay | Admitting: Family Medicine

## 2020-02-08 NOTE — Telephone Encounter (Signed)
Left detailed message on voicemail.  

## 2020-02-09 ENCOUNTER — Encounter: Payer: Self-pay | Admitting: Family Medicine

## 2020-02-10 NOTE — Telephone Encounter (Signed)
Patient was asking if it would be possible to get in for a colonoscopy with [facility] on [address], since it may take a while to get in at [facility].  Is this possible?  Please let me/patient know.  Thanks.

## 2020-02-12 ENCOUNTER — Encounter: Payer: Self-pay | Admitting: Physician Assistant

## 2020-02-12 ENCOUNTER — Other Ambulatory Visit: Payer: Self-pay | Admitting: Nephrology

## 2020-02-12 DIAGNOSIS — E1122 Type 2 diabetes mellitus with diabetic chronic kidney disease: Secondary | ICD-10-CM

## 2020-02-12 DIAGNOSIS — I129 Hypertensive chronic kidney disease with stage 1 through stage 4 chronic kidney disease, or unspecified chronic kidney disease: Secondary | ICD-10-CM

## 2020-02-12 DIAGNOSIS — N1832 Chronic kidney disease, stage 3b: Secondary | ICD-10-CM

## 2020-02-12 DIAGNOSIS — R809 Proteinuria, unspecified: Secondary | ICD-10-CM

## 2020-02-12 DIAGNOSIS — N2581 Secondary hyperparathyroidism of renal origin: Secondary | ICD-10-CM

## 2020-02-17 ENCOUNTER — Ambulatory Visit
Admission: RE | Admit: 2020-02-17 | Discharge: 2020-02-17 | Disposition: A | Discharge status: Home / Self Care | Payer: Medicare Other | Source: Ambulatory Visit | Attending: Nephrology | Admitting: Nephrology

## 2020-02-17 ENCOUNTER — Other Ambulatory Visit: Payer: Self-pay

## 2020-02-17 DIAGNOSIS — N1832 Chronic kidney disease, stage 3b: Secondary | ICD-10-CM | POA: Diagnosis present

## 2020-02-17 DIAGNOSIS — N2581 Secondary hyperparathyroidism of renal origin: Secondary | ICD-10-CM | POA: Insufficient documentation

## 2020-02-17 DIAGNOSIS — I129 Hypertensive chronic kidney disease with stage 1 through stage 4 chronic kidney disease, or unspecified chronic kidney disease: Secondary | ICD-10-CM

## 2020-02-17 DIAGNOSIS — E1122 Type 2 diabetes mellitus with diabetic chronic kidney disease: Secondary | ICD-10-CM | POA: Insufficient documentation

## 2020-02-17 DIAGNOSIS — R809 Proteinuria, unspecified: Secondary | ICD-10-CM | POA: Diagnosis present

## 2020-02-18 ENCOUNTER — Ambulatory Visit (INDEPENDENT_AMBULATORY_CARE_PROVIDER_SITE_OTHER): Payer: Medicare Other | Admitting: Podiatry

## 2020-02-18 ENCOUNTER — Ambulatory Visit (INDEPENDENT_AMBULATORY_CARE_PROVIDER_SITE_OTHER): Payer: Medicare Other

## 2020-02-18 ENCOUNTER — Encounter: Payer: Self-pay | Admitting: Podiatry

## 2020-02-18 DIAGNOSIS — M722 Plantar fascial fibromatosis: Secondary | ICD-10-CM

## 2020-02-18 DIAGNOSIS — G629 Polyneuropathy, unspecified: Secondary | ICD-10-CM | POA: Diagnosis not present

## 2020-02-18 DIAGNOSIS — M79672 Pain in left foot: Secondary | ICD-10-CM | POA: Diagnosis not present

## 2020-02-18 DIAGNOSIS — G8929 Other chronic pain: Secondary | ICD-10-CM

## 2020-02-18 NOTE — Progress Notes (Signed)
Subjective:   Patient ID: Luis Joseph, male   DOB: 64 y.o.   MRN: [id number]   HPI Patient presents with pain in the plantar central and lateral band of the fascia left and also has structural bunion deformity right with history of gout and moderate neuropathic symptomatology bilateral.  States it has been hurting him for several months and patient does not smoke likes to be active   Review of Systems  All other systems reviewed and are negative.       Objective:  Physical Exam Vitals and nursing note reviewed.  Constitutional:      Appearance: He is well-developed.  Pulmonary:     Effort: Pulmonary effort is normal.  Musculoskeletal:        General: Normal range of motion.  Skin:    General: Skin is warm.  Neurological:     Mental Status: He is alert.     Neurovascular status intact muscle strength was found to be adequate range of motion within normal limits.  Patient is found to have exquisite discomfort in the center and central lateral aspect of the left plantar fascia with pain upon palpation and is noted to have significant structural bunion deformity right with redness around the joint surface and moderate diminishment of sharp dull vibratory.  Patient has good digital perfusion well oriented x3     Assessment:  Acute plantar fasciitis central lateral band left fascia and moderate bunion with history of gout right and mild neuropathic symptomatology     Plan:  H&P reviewed all conditions and again a focus on the fascia.  I did sterile prep lateral side and injected the central and lateral aspect of the fascia 3 mg Kenalog 5 mg Xylocaine applied fascial brace to lift up the arch and gave instructions for stretching exercises and supportive shoe.  Reappoint for [place] to recheck do not recommend bunion correction currently may be necessary someday  X-rays indicate small spur plantar aspect left heel no indications of stress fracture arthritis

## 2020-02-18 NOTE — Patient Instructions (Signed)

## 2020-02-22 ENCOUNTER — Telehealth (INDEPENDENT_AMBULATORY_CARE_PROVIDER_SITE_OTHER): Payer: Self-pay | Admitting: Gastroenterology

## 2020-02-22 ENCOUNTER — Other Ambulatory Visit: Payer: Self-pay

## 2020-02-22 DIAGNOSIS — R195 Other fecal abnormalities: Secondary | ICD-10-CM

## 2020-02-22 NOTE — Progress Notes (Signed)
Gastroenterology Pre-Procedure Review  Request Date: Wed 03/02/20 Requesting Physician: Dr. Bonna Gains  PATIENT REVIEW QUESTIONS: The patient responded to the following health history questions as indicated:    1. Are you having any GI issues? yes (positive cologuard) 2. Do you have a personal history of Polyps? no 3. Do you have a family history of Colon Cancer or Polyps? no 4. Diabetes Mellitus? yes (type 2) 5. Joint replacements in the past 12 months?no Elbow Surgery January 2021 6. Major health problems in the past 3 months?no 7. Any artificial heart valves, MVP, or defibrillator?no    MEDICATIONS & ALLERGIES:    Patient reports the following regarding taking any anticoagulation/antiplatelet therapy:   Plavix, Coumadin, Eliquis, Xarelto, Lovenox, Pradaxa, Brilinta, or Effient? no Aspirin? no  Patient confirms/reports the following medications:  Current Outpatient Medications  Medication Sig Dispense Refill  . allopurinol (ZYLOPRIM) 300 MG tablet Take 1 tablet (300 mg total) by mouth daily. 90 tablet 3  . magnesium gluconate (MAGONATE) 500 MG tablet Take by mouth.    . telmisartan (MICARDIS) 40 MG tablet Take 1 tablet (40 mg total) by mouth daily. 90 tablet 3  . zinc gluconate 50 MG tablet Take by mouth.    . clotrimazole-betamethasone (LOTRISONE) cream APPLY TO AFFECTED AREA TWICE A DAY (Patient not taking: Reported on 02/22/2020) 45 g 3  . metoprolol succinate (TOPROL-XL) 100 MG 24 hr tablet Take with or immediately following a meal. (Patient not taking: Reported on 02/22/2020) 90 tablet 3  . niacin 500 MG tablet Take by mouth.     No current facility-administered medications for this visit.    Patient confirms/reports the following allergies:  Allergies  Allergen Reactions  . Codeine Sulfate Hives and Swelling  . Sildenafil Other (See Comments)    flushing flushing  . Sildenafil Citrate Other (See Comments)    flushing  . Crestor [Rosuvastatin]     Myalgias.   . Nsaids      CKD    No orders of the defined types were placed in this encounter.   AUTHORIZATION INFORMATION Primary Insurance: 1D#: Group #:  Secondary Insurance: 1D#: Group #:  SCHEDULE INFORMATION: Date: 03/02/20 Time: Location:[facility]

## 2020-02-29 ENCOUNTER — Other Ambulatory Visit: Admission: RE | Admit: 2020-02-29 | Payer: Medicare Other | Source: Ambulatory Visit

## 2020-03-01 ENCOUNTER — Other Ambulatory Visit
Admission: RE | Admit: 2020-03-01 | Discharge: 2020-03-01 | Disposition: A | Discharge status: Home / Self Care | Payer: Medicare Other | Source: Ambulatory Visit | Attending: Gastroenterology | Admitting: Gastroenterology

## 2020-03-01 ENCOUNTER — Other Ambulatory Visit: Payer: Self-pay

## 2020-03-01 DIAGNOSIS — Z01812 Encounter for preprocedural laboratory examination: Secondary | ICD-10-CM | POA: Insufficient documentation

## 2020-03-01 DIAGNOSIS — Z20822 Contact with and (suspected) exposure to covid-19: Secondary | ICD-10-CM | POA: Insufficient documentation

## 2020-03-02 ENCOUNTER — Encounter
Admission: RE | Disposition: A | Discharge status: Home / Self Care | Payer: Self-pay | Source: Home / Self Care | Attending: Gastroenterology

## 2020-03-02 ENCOUNTER — Ambulatory Visit
Admission: RE | Admit: 2020-03-02 | Discharge: 2020-03-02 | Disposition: A | Discharge status: Home / Self Care | Payer: Medicare Other | Attending: Gastroenterology | Admitting: Gastroenterology

## 2020-03-02 ENCOUNTER — Ambulatory Visit: Payer: Medicare Other | Admitting: Anesthesiology

## 2020-03-02 ENCOUNTER — Other Ambulatory Visit: Payer: Self-pay

## 2020-03-02 DIAGNOSIS — Z79899 Other long term (current) drug therapy: Secondary | ICD-10-CM | POA: Diagnosis not present

## 2020-03-02 DIAGNOSIS — K648 Other hemorrhoids: Secondary | ICD-10-CM | POA: Diagnosis not present

## 2020-03-02 DIAGNOSIS — N2581 Secondary hyperparathyroidism of renal origin: Secondary | ICD-10-CM | POA: Insufficient documentation

## 2020-03-02 DIAGNOSIS — Z87891 Personal history of nicotine dependence: Secondary | ICD-10-CM | POA: Diagnosis not present

## 2020-03-02 DIAGNOSIS — Z905 Acquired absence of kidney: Secondary | ICD-10-CM | POA: Diagnosis not present

## 2020-03-02 DIAGNOSIS — N183 Chronic kidney disease, stage 3 unspecified: Secondary | ICD-10-CM | POA: Insufficient documentation

## 2020-03-02 DIAGNOSIS — I129 Hypertensive chronic kidney disease with stage 1 through stage 4 chronic kidney disease, or unspecified chronic kidney disease: Secondary | ICD-10-CM | POA: Insufficient documentation

## 2020-03-02 DIAGNOSIS — K5730 Diverticulosis of large intestine without perforation or abscess without bleeding: Secondary | ICD-10-CM | POA: Diagnosis not present

## 2020-03-02 DIAGNOSIS — M109 Gout, unspecified: Secondary | ICD-10-CM | POA: Insufficient documentation

## 2020-03-02 DIAGNOSIS — Z9884 Bariatric surgery status: Secondary | ICD-10-CM | POA: Diagnosis not present

## 2020-03-02 DIAGNOSIS — E1122 Type 2 diabetes mellitus with diabetic chronic kidney disease: Secondary | ICD-10-CM | POA: Insufficient documentation

## 2020-03-02 DIAGNOSIS — R195 Other fecal abnormalities: Secondary | ICD-10-CM

## 2020-03-02 HISTORY — PX: COLONOSCOPY WITH PROPOFOL: SHX5780

## 2020-03-02 LAB — SARS CORONAVIRUS 2 (TAT 6-24 HRS): SARS Coronavirus 2: NEGATIVE

## 2020-03-02 LAB — GLUCOSE, CAPILLARY: Glucose-Capillary: 167 mg/dL — ABNORMAL HIGH (ref 70–99)

## 2020-03-02 SURGERY — COLONOSCOPY WITH PROPOFOL
Anesthesia: General

## 2020-03-02 MED ORDER — PROPOFOL 500 MG/50ML IV EMUL
INTRAVENOUS | Status: DC | PRN
  Administered 2020-03-02: 100 ug/kg/min via INTRAVENOUS

## 2020-03-02 MED ORDER — PROPOFOL 10 MG/ML IV BOLUS
INTRAVENOUS | Status: DC | PRN
  Administered 2020-03-02: 50 mg via INTRAVENOUS
  Administered 2020-03-02: 100 mg via INTRAVENOUS

## 2020-03-02 MED ORDER — PROPOFOL 10 MG/ML IV BOLUS
INTRAVENOUS | Status: AC
  Filled 2020-03-02: qty 20

## 2020-03-02 MED ORDER — PROPOFOL 500 MG/50ML IV EMUL
INTRAVENOUS | Status: AC
  Filled 2020-03-02: qty 50

## 2020-03-02 MED ORDER — SODIUM CHLORIDE 0.9 % IV SOLN
INTRAVENOUS | Status: DC
  Administered 2020-03-02: 20 mL/h via INTRAVENOUS

## 2020-03-02 NOTE — Anesthesia Postprocedure Evaluation (Signed)
Anesthesia Post Note  Patient: Luis Joseph  Procedure(s) Performed: COLONOSCOPY WITH PROPOFOL (N/A )  Patient location during evaluation: Endoscopy Anesthesia Type: General Level of consciousness: awake and alert Pain management: pain level controlled Vital Signs Assessment: post-procedure vital signs reviewed and stable Respiratory status: spontaneous breathing and respiratory function stable Cardiovascular status: stable Anesthetic complications: no   No complications documented.   Last Vitals:  Vitals:   03/02/20 1030 03/02/20 1040  BP: (!) 87/60 (!) 153/60  Pulse: (!) 57   Resp: 13   Temp:    SpO2: 100%     Last Pain:  Vitals:   03/02/20 0824  PainSc: 3                  [name]
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
## 2020-03-02 NOTE — Transfer of Care (Signed)
Immediate Anesthesia Transfer of Care Note  Patient: Luis Joseph  Procedure(s) Performed: COLONOSCOPY WITH PROPOFOL (N/A )  Patient Location: PACU  Anesthesia Type:General  Level of Consciousness: awake, alert  and oriented  Airway & Oxygen Therapy: Patient Spontanous Breathing and Patient connected to face mask oxygen  Post-op Assessment: Report given to RN and Post -op Vital signs reviewed and stable  Post vital signs: Reviewed and stable  Last Vitals:  Vitals Value Taken Time  BP 107/75 03/02/20 1013  Temp    Pulse 63 03/02/20 1013  Resp 16 03/02/20 1013  SpO2 97 % 03/02/20 1013  Vitals shown include unvalidated device data.  Last Pain:  Vitals:   03/02/20 0824  PainSc: 3          Complications: No complications documented.

## 2020-03-02 NOTE — Anesthesia Preprocedure Evaluation (Signed)
Anesthesia Evaluation  Patient identified by MRN, date of birth, ID band Patient awake    Reviewed: Allergy & Precautions, NPO status , Patient's Chart, lab work & pertinent test results  History of Anesthesia Complications Negative for: history of anesthetic complications  Airway Mallampati: II       Dental   Pulmonary neg sleep apnea, neg COPD, Not current smoker, former smoker,           Cardiovascular hypertension, Pt. on medications (-) Past MI and (-) CHF (-) dysrhythmias (-) Valvular Problems/Murmurs     Neuro/Psych neg Seizures Anxiety    GI/Hepatic Neg liver ROS, neg GERD  ,  Endo/Other  diabetes, Type 2, Oral Hypoglycemic Agents  Renal/GU Renal InsufficiencyRenal disease     Musculoskeletal   Abdominal   Peds  Hematology   Anesthesia Other Findings   Reproductive/Obstetrics                             Anesthesia Physical Anesthesia Plan  ASA: III  Anesthesia Plan: General   Post-op Pain Management:    Induction: Intravenous  PONV Risk Score and Plan: 2 and Propofol infusion and TIVA  Airway Management Planned: Nasal Cannula  Additional Equipment:   Intra-op Plan:   Post-operative Plan:   Informed Consent: I have reviewed the patients History and Physical, chart, labs and discussed the procedure including the risks, benefits and alternatives for the proposed anesthesia with the patient or authorized representative who has indicated his/her understanding and acceptance.       Plan Discussed with:   Anesthesia Plan Comments:         Anesthesia Quick Evaluation

## 2020-03-02 NOTE — Op Note (Signed)
[facility] Patient Name: Luis Joseph Procedure Date: 03/02/2020 9:28 AM MRN: [id number] Account #: [id number] Date of Birth: 1956/04/05 Admit Type: Outpatient Age: 64 Room: [facility] ENDO ROOM 1 Gender: Male Note Status: Finalized Procedure:             Colonoscopy Indications:           Positive Cologuard test Providers:             Dnaiel Voller B. Bonna Gains MD, MD Medicines:             Monitored Anesthesia Care Complications:         No immediate complications. Procedure:             Pre-Anesthesia Assessment:                        - Prior to the procedure, a History and Physical was                         performed, and patient medications, allergies and                         sensitivities were reviewed. The patient's tolerance                         of previous anesthesia was reviewed.                        - The risks and benefits of the procedure and the                         sedation options and risks were discussed with the                         patient. All questions were answered and informed                         consent was obtained.                        - Patient identification and proposed procedure were                         verified prior to the procedure by the physician, the                         nurse, the anesthetist and the technician. The                         procedure was verified in the pre-procedure area in                         the procedure room in the endoscopy suite.                        - ASA Grade Assessment: II - A patient with mild                         systemic disease.                        -  After reviewing the risks and benefits, the patient                         was deemed in satisfactory condition to undergo the                         procedure.                        After obtaining informed consent, the colonoscope was                         passed under direct vision. Throughout  the procedure,                         the patient's blood pressure, pulse, and oxygen                         saturations were monitored continuously. The                         Colonoscope was introduced through the anus and                         advanced to the the cecum, identified by appendiceal                         orifice and ileocecal valve. The colonoscopy was                         performed with ease. The patient tolerated the                         procedure well. The quality of the bowel preparation                         was fair, and was cleaned well with water and                         suctioning. Findings:      The perianal and digital rectal examinations were normal.      Multiple diverticula were found in the sigmoid colon.      The exam was otherwise without abnormality.      The rectum, sigmoid colon, descending colon, transverse colon, ascending       colon and cecum appeared normal.      Non-bleeding internal hemorrhoids were found during retroflexion. Impression:            - Diverticulosis in the sigmoid colon.                        - The examination was otherwise normal.                        - The rectum, sigmoid colon, descending colon,                         transverse colon, ascending colon and cecum are normal.                        -  Non-bleeding internal hemorrhoids.                        - No specimens collected. Recommendation:        - Discharge patient to home.                        - Resume previous diet.                        - Continue present medications.                        - Repeat colonoscopy in 1-2 years with 2 day prep.                        - Return to primary care physician as previously                         scheduled.                        - The findings and recommendations were discussed with                         the patient.                        - The findings and recommendations were discussed with                          the patient's family.                        - High fiber diet. Procedure Code(s):     --- Professional ---                        [phone], Colonoscopy, flexible; diagnostic, including                         collection of specimen(s) by brushing or washing, when                         performed (separate procedure) Diagnosis Code(s):     --- Professional ---                        R19.5, Other fecal abnormalities CPT copyright 2019 American Medical Association. All rights reserved. The codes documented in this report are preliminary and upon coder review may  be revised to meet current compliance requirements.  Vonda Antigua, MD Margretta Sidle B. Bonna Gains MD, MD 03/02/2020 10:13:12 AM This report has been signed electronically. Number of Addenda: 0 Note Initiated On: 03/02/2020 9:28 AM Scope Withdrawal Time: 0 hours 16 minutes 39 seconds  Total Procedure Duration: 0 hours 19 minutes 23 seconds       [facility]

## 2020-03-03 ENCOUNTER — Ambulatory Visit (INDEPENDENT_AMBULATORY_CARE_PROVIDER_SITE_OTHER): Payer: Medicare Other | Admitting: Podiatry

## 2020-03-03 ENCOUNTER — Encounter: Payer: Self-pay | Admitting: Podiatry

## 2020-03-03 DIAGNOSIS — M722 Plantar fascial fibromatosis: Secondary | ICD-10-CM | POA: Diagnosis not present

## 2020-03-03 NOTE — Progress Notes (Signed)
Subjective:   Patient ID: Luis Joseph, male   DOB: 64 y.o.   MRN: [id number]   HPI Patient states improved but has 1 area that still quite sore on the bottom outside of the left heel   ROS      Objective:  Physical Exam  Neurovascular status intact with inflammation pain of the plantar lateral aspect of the fascial left with discomfort upon deep palpation     Assessment:  Acute plantar fasciitis left improved but painful plantar lateral left     Plan:  H&P reviewed condition sterile prep done to careful lateral injection 3 mg Dexasone Kenalog 5 mg Xylocaine advised on continued brace usage and supportive shoes and will be seen back as needed

## 2020-03-04 ENCOUNTER — Ambulatory Visit (INDEPENDENT_AMBULATORY_CARE_PROVIDER_SITE_OTHER): Payer: Medicare Other | Admitting: Urology

## 2020-03-04 ENCOUNTER — Other Ambulatory Visit: Payer: Self-pay

## 2020-03-04 ENCOUNTER — Encounter: Payer: Self-pay | Admitting: Urology

## 2020-03-04 VITALS — BP 146/91 | HR 63 | Ht 70.0 in | Wt 241.2 lb

## 2020-03-04 DIAGNOSIS — N1832 Chronic kidney disease, stage 3b: Secondary | ICD-10-CM | POA: Diagnosis not present

## 2020-03-04 DIAGNOSIS — N1339 Other hydronephrosis: Secondary | ICD-10-CM | POA: Diagnosis not present

## 2020-03-04 DIAGNOSIS — N200 Calculus of kidney: Secondary | ICD-10-CM | POA: Diagnosis not present

## 2020-03-04 NOTE — Patient Instructions (Signed)

## 2020-03-04 NOTE — Progress Notes (Signed)
03/04/2020 11:21 AM   Luis Joseph 07/17/1955 [id number]  Referring provider: Tonia Ghent, MD [address] [place],  [place] [address]  Chief complaint: Hydronephrosis  HPI:  Mr. Luis Joseph has progressive stage III chronic kidney disease with a recent creatinine of 2.16 and a GFR of 46.  He has a long urologic history with a right pyeloplasty in 1979.  He has chronic hydronephrosis and atrophy of the right kidney. He also has some atrophy though the left lower pole.   He was last extensively evaluated in 2015 when a Lasix scan revealed 35% function on the right and 65% on the left.  There was tracer accumulation on the right without washout, and normal washout on the left.  He was taken by Dr. Tresa Moore for right retrograde and right ureteroscopy when chronic dilation was noted of the right side but a widely patent proximal ureter and UPJ.  Stone was noted in the right lower pole outside of the parenchyma.    His most recent imaging includes a CT scan in August 2017 which showed chronic right Hydro and large right lower pole stones or conglomerate of stones which may be outside of the collecting system. There was also scarring of the left lower pole.  Renal ultrasound was done which prompted referral September 2021 which again showed chronic right hydronephrosis stone in the right lower pole, scarring and atrophy of the left lower pole and left lower pole stones.  Bilateral ureteral jets were noted.  Ura voids with a good stream. He hasn't had any flank pain or stone passage. No gross hematuria. UA clear.   He works at [employer].    PMH: Past Medical History:  Diagnosis Date  . Allergic rhinitis   . Anemia in chronic kidney disease   . CKD (chronic kidney disease), stage III [facility])    NEPHROLOGIST-  DR Lavonia Dana  . Diabetes mellitus, type 2 (HCC)    diet controlled  . GERD (gastroesophageal reflux disease) 05/21/1978  . Gout 05/21/1985  . Hematuria   . History of  kidney stones   . Hydronephrosis, right   . Hyperlipidemia 05/21/1988  . Hyperparathyroidism, secondary renal ([place])   . Hypertension 05/21/1978  . 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 Renal cyst, left     Surgical History: Past Surgical History:  Procedure Laterality Date  . COLONOSCOPY WITH PROPOFOL N/A 03/02/2020   Procedure: COLONOSCOPY WITH PROPOFOL;  Surgeon: Virgel Manifold, MD;  Location: [facility] ENDOSCOPY;  Service: Endoscopy;  Laterality: N/A;  priority 3  . CYSTOSCOPY W/ URETERAL STENT PLACEMENT Right 11/25/2012   Procedure: CYSTOSCOPY WITH RETROGRADE PYELOGRAM/URETERAL STENT PLACEMENT;  Surgeon: Hanley Ben, MD;  Location: [place];  Service: Urology;  Laterality: Right;  . CYSTOSCOPY/RETROGRADE/URETEROSCOPY Right 02/10/2013   Procedure: CYSTOSCOPY/RETROGRADE/URETEROSCOPY Right stent removal  Procedure: Cysto, Right Retrograde, Right Stent Pull, Right Diagnostic Ureteroscopy;  Surgeon: Alexis Frock, MD;  Location[facility];  Service: Urology;  Laterality: Right;  . GASTRIC ROUX-EN-Y N/A 03/01/2014   Procedure: LAPAROSCOPIC ROUX-EN-Y GASTRIC BYPASS WITH UPPER ENDOSCOPY;  Surgeon: Kaylyn Lim, MD;  Location: [facility];  Service: General;  Laterality: N/A;  . LASIK    . ORIF ANKLE FRACTURE Left 11/14/2016   Procedure: OPEN REDUCTION INTERNAL FIXATION (ORIF) LEFT TRIMALLEOLAR ANKLE FRACTURE;  Surgeon: Leandrew Koyanagi, MD;  Location: [place];  Service: Orthopedics;  Laterality: Left;  . PARTIAL NEPHRECTOMY  12/19/1977   RIGHT PYELOPLASTY AND removal  benign cyst and stones    Home Medications:  Allergies as of 03/04/2020      Reactions   Codeine Sulfate Hives, Swelling   Sildenafil Other (See Comments)   flushing flushing   Sildenafil Citrate Other (See Comments)   flushing   Crestor [rosuvastatin]    Myalgias.    Nsaids    CKD      Medication List       Accurate as of March 04, 2020 11:21 AM. If you have  any questions, ask your nurse or doctor.        allopurinol 300 MG tablet Commonly known as: ZYLOPRIM Take 1 tablet (300 mg total) by mouth daily.   clotrimazole-betamethasone cream Commonly known as: LOTRISONE APPLY TO AFFECTED AREA TWICE A DAY   magnesium gluconate 500 MG tablet Commonly known as: MAGONATE Take by mouth.   metoprolol succinate 100 MG 24 hr tablet Commonly known as: TOPROL-XL Take with or immediately following a meal.   niacin 500 MG tablet Take by mouth.   telmisartan 40 MG tablet Commonly known as: MICARDIS Take 1 tablet (40 mg total) by mouth daily.   zinc gluconate 50 MG tablet Take by mouth.       Allergies:  Allergies  Allergen Reactions  . Codeine Sulfate Hives and Swelling  . Sildenafil Other (See Comments)    flushing flushing  . Sildenafil Citrate Other (See Comments)    flushing  . Crestor [Rosuvastatin]     Myalgias.   . Nsaids     CKD    Family History: Family History  Problem Relation Age of Onset  . Heart disease Mother   . Diabetes Mother   . Hypertension Mother   . Cancer Father        Lung  . Diabetes Sister   . Heart disease Maternal Grandfather   . Diabetes Maternal Grandfather   . Stroke Neg Hx   . Prostate cancer Neg Hx   . Colon cancer Neg Hx     Social History:  reports that he quit smoking about 42 years ago. He started smoking about 48 years ago. He has a 6.00 pack-year smoking history. He has never used smokeless tobacco. He reports that he does not drink alcohol and does not use drugs.   Physical Exam: There were no vitals taken for this visit.  Constitutional:  Alert and oriented, No acute distress. HEENT: [place] AT, moist mucus membranes.  Trachea midline, no masses. Cardiovascular: No clubbing, cyanosis, or edema. Respiratory: Normal respiratory effort, no increased work of breathing. GI: Abdomen is soft, nontender, nondistended, no abdominal masses GU: No CVA tenderness Lymph: No cervical or  inguinal lymphadenopathy. Skin: No rashes, bruises or suspicious lesions. Neurologic: Grossly intact, no focal deficits, moving all 4 extremities. Psychiatric: Normal mood and affect.  Laboratory Data: Lab Results  Component Value Date   WBC 6.0 12/14/2019   HGB 13.2 12/14/2019   HCT 38.3 (L) 12/14/2019   MCV 94.3 12/14/2019   PLT 205 12/14/2019    Lab Results  Component Value Date   CREATININE 2.16 (H) 12/14/2019    Lab Results  Component Value Date   PSA 0.57 10/13/2009   PSA 0.75 02/23/2009   PSA 0.69 08/22/2006    Lab Results  Component Value Date   TESTOSTERONE 544 12/14/2019    Lab Results  Component Value Date   HGBA1C 7.6 (H) 12/14/2019    Urinalysis No results found for: COLORURINE, APPEARANCEUR, [place], [place], [place], [place], [place], [place], [place],  UROBILINOGEN, NITRITE, LEUKOCYTESUR  No results found for: LABMICR, WBCUA, RBCUA, LABEPIT, MUCUS, BACTERIA  Pertinent Imaging: Lasix scan x 2, RGP image, CT scan, [place] - images reviewed  No results found for this or any previous visit.  No results found for this or any previous visit.  No results found for this or any previous visit.  No results found for this or any previous visit.  Results for orders placed during the hospital encounter of 02/17/20  US RENAL  Narrative CLINICAL DATA:  Stage III B chronic kidney disease  EXAM: RENAL / URINARY TRACT ULTRASOUND COMPLETE  COMPARISON:  Abdomen and pelvis CT 01/11/2016  FINDINGS: Right Kidney:  Renal measurements: 12 x 6.5 x 7.5 cm = volume: 304 mL. Chronic moderate hydronephrosis. Lower pole calculi measuring 9 mm. Two measured cystic structures which could be dilated calices or cortical cysts, up to 4.1 cm.  Left Kidney:  Renal measurements: 10.5 x 5 x 6 cm = volume: 164 mL. Marked scarring to the lower pole. Lower pole calculus measuring 9 mm. Simple upper pole cyst measuring just over 2 cm  Bladder:  Appears normal for  degree of bladder distention. Bilateral ureteral jets.  IMPRESSION: 1. Chronic right hydronephrosis with present ureteral jet. 2. Chronic bilateral lower pole renal calculi and scarring.   Electronically Signed By: Monte Fantasia M.D. On: 02/19/2020 09:38  No results found for this or any previous visit.  No results found for this or any previous visit.  No results found for this or any previous visit.   Assessment & Plan:    Hydronephrosis - chronic. No symptoms or intervention (stent) likely needed.   Kidney stones - we discussed getting a better picture of his kidneys with a CT scan. He elects to proceed.   No follow-ups on file.  Festus Aloe, MD  [facility] [address] [place], [place] [address] [phone]

## 2020-03-05 LAB — URINALYSIS, COMPLETE
Bilirubin, UA: NEGATIVE
Ketones, UA: NEGATIVE
Leukocytes,UA: NEGATIVE
Nitrite, UA: NEGATIVE
Specific Gravity, UA: 1.025 (ref 1.005–1.030)
Urobilinogen, Ur: 0.2 mg/dL (ref 0.2–1.0)
pH, UA: 6.5 (ref 5.0–7.5)

## 2020-03-05 LAB — MICROSCOPIC EXAMINATION
Bacteria, UA: NONE SEEN
Epithelial Cells (non renal): NONE SEEN /hpf (ref 0–10)

## 2020-03-16 ENCOUNTER — Ambulatory Visit: Payer: Medicare Other | Admitting: Physician Assistant

## 2020-03-23 ENCOUNTER — Other Ambulatory Visit: Payer: Self-pay

## 2020-03-23 ENCOUNTER — Ambulatory Visit
Admission: RE | Admit: 2020-03-23 | Discharge: 2020-03-23 | Disposition: A | Discharge status: Home / Self Care | Payer: Medicare Other | Source: Ambulatory Visit | Attending: Urology | Admitting: Urology

## 2020-03-23 DIAGNOSIS — N1339 Other hydronephrosis: Secondary | ICD-10-CM

## 2020-04-01 ENCOUNTER — Encounter: Payer: Self-pay | Admitting: Urology

## 2020-04-01 ENCOUNTER — Other Ambulatory Visit: Payer: Self-pay

## 2020-04-01 ENCOUNTER — Ambulatory Visit (INDEPENDENT_AMBULATORY_CARE_PROVIDER_SITE_OTHER): Payer: Medicare Other | Admitting: Urology

## 2020-04-01 ENCOUNTER — Ambulatory Visit: Payer: Self-pay | Admitting: Urology

## 2020-04-01 VITALS — BP 183/117 | HR 67 | Ht 70.0 in | Wt 240.0 lb

## 2020-04-01 DIAGNOSIS — N200 Calculus of kidney: Secondary | ICD-10-CM

## 2020-04-01 DIAGNOSIS — N1339 Other hydronephrosis: Secondary | ICD-10-CM

## 2020-04-01 DIAGNOSIS — N261 Atrophy of kidney (terminal): Secondary | ICD-10-CM | POA: Diagnosis not present

## 2020-04-01 DIAGNOSIS — N1832 Chronic kidney disease, stage 3b: Secondary | ICD-10-CM

## 2020-04-01 NOTE — Patient Instructions (Signed)

## 2020-04-01 NOTE — Progress Notes (Signed)
04/01/2020 11:40 AM   Luis Joseph 1956-01-22 [id number]  Referring provider: Tonia Ghent, MD [address] [place],  [place] [address]  Chief Complaint  Patient presents with  . Results    HPI:  Follow-up-  Right congenital UPJ obstruction, , RLP stones, left lower pole atrophy with chronic kidney disease-  Luis Joseph has progressive stage III chronic kidney disease with Jul 2021 creatinine of 2.16 and a GFR of 46.  He has a long urologic history with a right pyeloplasty in 1979.  He has chronic hydronephrosis and atrophy of the right kidney. He also has some atrophy though the left lower pole.   He was last extensively evaluated in 2015 when a Lasix scan revealed 35% function on the right and 65% on the left.  There was tracer accumulation on the right without washout, and normal washout on the left.  He was taken by Luis Joseph for right retrograde and right ureteroscopy when chronic dilation was noted of the right side but a widely patent proximal ureter and UPJ.  Stone was noted in the right lower pole outside of the parenchyma.    CT scan in August 2017 which showed chronic right Hydro and large right lower pole stones or conglomerate of stones which may be outside of the collecting system. There was also scarring of the left lower pole.  Renal ultrasound was done Sep 2021 which again showed chronic right hydronephrosis stone in the right lower pole, scarring and atrophy of the left lower pole and left lower pole stones.  Bilateral ureteral jets were noted.  CT scan abdomen and pelvis repeated 03/23/2020.  This revealed identical findings to the 2017 CT with right lower pole stones (outside of collecting system on last ureteroscopy), right renal atrophy and hydro-, left lower pole atrophy.  Luis Joseph with a good stream. He hasn't had any flank pain or stone passage. No gross hematuria. UA clear.   He works at [employer].   He follows up to review most recent  CT. He is doing well.  PMH: Past Medical History:  Diagnosis Date  . Allergic rhinitis   . Anemia in chronic kidney disease   . CKD (chronic kidney disease), stage III [facility])    NEPHROLOGIST-  DR Luis Joseph  . Diabetes mellitus, type 2 (HCC)    diet controlled  . GERD (gastroesophageal reflux disease) 05/21/1978  . Gout 05/21/1985  . Hematuria   . History of kidney stones   . Hydronephrosis, right   . Hyperlipidemia 05/21/1988  . Hyperparathyroidism, secondary renal ([place])   . Hypertension 05/21/1978  . 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 Renal cyst, left     Surgical History: Past Surgical History:  Procedure Laterality Date  . COLONOSCOPY WITH PROPOFOL N/A 03/02/2020   Procedure: COLONOSCOPY WITH PROPOFOL;  Surgeon: Luis Manifold, MD;  Location: [facility];  Service: Endoscopy;  Laterality: N/A;  priority 3  . CYSTOSCOPY W/ URETERAL STENT PLACEMENT Right 11/25/2012   Procedure: CYSTOSCOPY WITH RETROGRADE PYELOGRAM/URETERAL STENT PLACEMENT;  Surgeon: Luis Ben, MD;  Location: ;  Service: Urology;  Laterality: Right;  . CYSTOSCOPY/RETROGRADE/URETEROSCOPY Right 02/10/2013   Procedure: CYSTOSCOPY/RETROGRADE/URETEROSCOPY Right stent removal  Procedure: Cysto, Right Retrograde, Right Stent Pull, Right Diagnostic Ureteroscopy;  Surgeon: Luis Frock, MD;  Location: [facility];  Service: Urology;  Laterality: Right;  . GASTRIC ROUX-EN-Y N/A 03/01/2014   Procedure: LAPAROSCOPIC ROUX-EN-Y GASTRIC BYPASS WITH UPPER ENDOSCOPY;  Surgeon:  Luis Lim, MD;  [facility];  Service: General;  Laterality: N/A;  . LASIK    . ORIF ANKLE FRACTURE Left 11/14/2016   Procedure: OPEN REDUCTION INTERNAL FIXATION (ORIF) LEFT TRIMALLEOLAR ANKLE FRACTURE;  Surgeon: Luis Koyanagi, MD;  Location: [place];  Service: Orthopedics;  Laterality: Left;  . PARTIAL NEPHRECTOMY  12/19/1977   RIGHT PYELOPLASTY AND removal benign  cyst and stones    Home Medications:  Allergies as of 04/01/2020      Reactions   Codeine Sulfate Hives, Swelling   Sildenafil Other (See Comments)   flushing flushing   Crestor [rosuvastatin]    Myalgias.    Nsaids    CKD      Medication List       Accurate as of April 01, 2020 11:40 AM. If you have any questions, ask your nurse or doctor.        allopurinol 300 MG tablet Commonly known as: ZYLOPRIM Take 1 tablet (300 mg total) by mouth daily.   clotrimazole-betamethasone cream Commonly known as: LOTRISONE APPLY TO AFFECTED AREA TWICE A DAY   magnesium gluconate 500 MG tablet Commonly known as: MAGONATE Take by mouth.   metoprolol succinate 100 MG 24 hr tablet Commonly known as: TOPROL-XL Take with or immediately following a meal.   telmisartan 40 MG tablet Commonly known as: MICARDIS Take 1 tablet (40 mg total) by mouth daily.   zinc gluconate 50 MG tablet Take by mouth.       Allergies:  Allergies  Allergen Reactions  . Codeine Sulfate Hives and Swelling  . Sildenafil Other (See Comments)    flushing flushing  . Crestor [Rosuvastatin]     Myalgias.   . Nsaids     CKD    Family History: Family History  Problem Relation Age of Onset  . Heart disease Mother   . Diabetes Mother   . Hypertension Mother   . Cancer Father        Lung  . Diabetes Sister   . Heart disease Maternal Grandfather   . Diabetes Maternal Grandfather   . Stroke Neg Hx   . Prostate cancer Neg Hx   . Colon cancer Neg Hx     Social History:  reports that he quit smoking about 42 years ago. He started smoking about 48 years ago. He has a 6.00 pack-year smoking history. He has never used smokeless tobacco. He reports that he does not drink alcohol and does not use drugs.   Physical Exam: BP (!) 183/117 (BP Location: Left Arm, Patient Position: Sitting, Cuff Size: Large)   Pulse 67   Ht 5\' 10"  (1.778 m)   Wt 240 lb (108.9 kg)   BMI 34.44 kg/m   Constitutional:   Alert and oriented, No acute distress. HEENT: [place] AT, moist mucus membranes.  Trachea midline, no masses. Cardiovascular: No clubbing, cyanosis, or edema. Respiratory: Normal respiratory effort, no increased work of breathing. GI: Abdomen is soft, nontender, nondistended, no abdominal masses GU: No CVA tenderness Lymph: No cervical or inguinal lymphadenopathy. Skin: No rashes, bruises or suspicious lesions. Neurologic: Grossly intact, no focal deficits, moving all 4 extremities. Psychiatric: Normal mood and affect.  Laboratory Data: Lab Results  Component Value Date   WBC 6.0 12/14/2019   HGB 13.2 12/14/2019   HCT 38.3 (L) 12/14/2019   MCV 94.3 12/14/2019   PLT 205 12/14/2019    Lab Results  Component Value Date   CREATININE 2.16 (H) 12/14/2019    Lab  Results  Component Value Date   PSA 0.57 10/13/2009   PSA 0.75 02/23/2009   PSA 0.69 08/22/2006    Lab Results  Component Value Date   TESTOSTERONE 544 12/14/2019    Lab Results  Component Value Date   HGBA1C 7.6 (H) 12/14/2019    Urinalysis    Component Value Date/Time   APPEARANCEUR Clear 03/04/2020 1130   GLUCOSEU 1+ (A) 03/04/2020 1130   BILIRUBINUR Negative 03/04/2020 1130   PROTEINUR 3+ (A) 03/04/2020 1130   NITRITE Negative 03/04/2020 1130   LEUKOCYTESUR Negative 03/04/2020 1130    Lab Results  Component Value Date   LABMICR See below: 03/04/2020   WBCUA 0-5 03/04/2020   LABEPIT None seen 03/04/2020   BACTERIA None seen 03/04/2020    Pertinent Imaging: CT scan 2017 , CT 2021 No results found for this or any previous visit.  No results found for this or any previous visit.  No results found for this or any previous visit.  No results found for this or any previous visit.  Results for orders placed during the hospital encounter of 02/17/20  US RENAL  Narrative CLINICAL DATA:  Stage III B chronic kidney disease  EXAM: RENAL / URINARY TRACT ULTRASOUND COMPLETE  COMPARISON:  Abdomen and  pelvis CT 01/11/2016  FINDINGS: Right Kidney:  Renal measurements: 12 x 6.5 x 7.5 cm = volume: 304 mL. Chronic moderate hydronephrosis. Lower pole calculi measuring 9 mm. Two measured cystic structures which could be dilated calices or cortical cysts, up to 4.1 cm.  Left Kidney:  Renal measurements: 10.5 x 5 x 6 cm = volume: 164 mL. Marked scarring to the lower pole. Lower pole calculus measuring 9 mm. Simple upper pole cyst measuring just over 2 cm  Bladder:  Appears normal for degree of bladder distention. Bilateral ureteral jets.  IMPRESSION: 1. Chronic right hydronephrosis with present ureteral jet. 2. Chronic bilateral lower pole renal calculi and scarring.   Electronically Signed By: Monte Fantasia M.D. On: 02/19/2020 09:38  No results found for this or any previous visit.  No results found for this or any previous visit.  No results found for this or any previous visit.   Assessment & Plan:    He continues to have slowly progressive kidney disease but chronic and stable right renal dilation, atrophy and stones as well as left lower pole atrophy.  No urologic intervention needed or indicated.  We will see back in 1 year with ultrasound and KUB.  No follow-ups on file.  Festus Aloe, MD  [facility] [address] [place], [place] [address] [phone]

## 2020-05-12 ENCOUNTER — Other Ambulatory Visit: Payer: Self-pay

## 2020-05-12 ENCOUNTER — Ambulatory Visit (INDEPENDENT_AMBULATORY_CARE_PROVIDER_SITE_OTHER): Payer: Medicare Other | Admitting: Family Medicine

## 2020-05-12 ENCOUNTER — Encounter: Payer: Self-pay | Admitting: Family Medicine

## 2020-05-12 VITALS — BP 150/82 | HR 72 | Temp 96.2°F | Ht 70.0 in | Wt 249.1 lb

## 2020-05-12 DIAGNOSIS — E1165 Type 2 diabetes mellitus with hyperglycemia: Secondary | ICD-10-CM | POA: Diagnosis not present

## 2020-05-12 DIAGNOSIS — E119 Type 2 diabetes mellitus without complications: Secondary | ICD-10-CM | POA: Diagnosis not present

## 2020-05-12 DIAGNOSIS — N1832 Chronic kidney disease, stage 3b: Secondary | ICD-10-CM | POA: Diagnosis not present

## 2020-05-12 DIAGNOSIS — Z9884 Bariatric surgery status: Secondary | ICD-10-CM | POA: Diagnosis not present

## 2020-05-12 DIAGNOSIS — I10 Essential (primary) hypertension: Secondary | ICD-10-CM

## 2020-05-12 LAB — BASIC METABOLIC PANEL
BUN: 70 mg/dL — ABNORMAL HIGH (ref 6–23)
CO2: 24 mEq/L (ref 19–32)
Calcium: 9.2 mg/dL (ref 8.4–10.5)
Chloride: 104 mEq/L (ref 96–112)
Creatinine, Ser: 2.45 mg/dL — ABNORMAL HIGH (ref 0.40–1.50)
GFR: 26.8 mL/min — ABNORMAL LOW (ref >60.00)
Glucose, Bld: 212 mg/dL — ABNORMAL HIGH (ref 70–99)
Potassium: 4.9 mEq/L (ref 3.5–5.1)
Sodium: 134 mEq/L — ABNORMAL LOW (ref 135–145)

## 2020-05-12 LAB — HEMOGLOBIN A1C: Hgb A1c MFr Bld: 8.5 % — ABNORMAL HIGH (ref 4.6–6.5)

## 2020-05-12 LAB — MAGNESIUM: Magnesium: 2.1 mg/dL (ref 1.5–2.5)

## 2020-05-12 MED ORDER — CLOTRIMAZOLE-BETAMETHASONE 1-0.05 % EX CREA
TOPICAL_CREAM | CUTANEOUS | 3 refills | Status: DC
Start: 2020-05-12 — End: 2020-11-25

## 2020-05-12 MED ORDER — TELMISARTAN 80 MG PO TABS
80.0000 mg | ORAL_TABLET | Freq: Every day | ORAL | Status: DC
Start: 2020-05-12 — End: 2021-04-17

## 2020-05-12 MED ORDER — AMLODIPINE BESYLATE 2.5 MG PO TABS: 2.5000 mg | ORAL_TABLET | Freq: Every day | ORAL | 3 refills | Status: DC

## 2020-05-12 NOTE — Progress Notes (Signed)
This visit occurred during the SARS-CoV-2 public health emergency.  Safety protocols were in place, including screening questions prior to the visit, additional usage of staff PPE, and extensive cleaning of exam room while observing appropriate contact time as indicated for disinfecting solutions.  Hypertension:    Using medication without problems or lightheadedness: yes Chest pain with exertion:no Edema:no Short of breath:no Average home BPs: usually ~140-150s/80s  He has renal f/u pending re: CKD.  He avoids NSAIDs.  Discussed.  Shingles vaccine d/w pt.  See avs.   DM2 hx noted.  Off meds.  Due for f/u A1c.  Statin intolerant.    He was having cramps at night that improved with magnesium use.  Discussed.  No adverse effect on medication.  He is gaining weight.  D/w pt about his daily routine.  He isn't working and isn't active.  We talked about exercise options.  L heel pain is affecting walking, only temporary relief with injection.  He is going to see Dr. Sharol Given about his heel.  Pain with 1st steps in the AM.  He is looking to use an exercise bike, discussed.    Meds, vitals, and allergies reviewed.   ROS: Per HPI unless specifically indicated in ROS section   GEN: nad, alert and oriented HEENT: ncat NECK: supple w/o LA CV: rrr PULM: ctab, no inc wob ABD: soft, +bs EXT: no edema SKIN: no acute rash

## 2020-05-12 NOTE — Patient Instructions (Signed)
Check with your insurance to see if they will cover the shingles shot. Go to the lab on the way out.   If you have mychart we'll likely use that to update you.    Take care.  Glad to see you. Start amlodipine 2.5mg  a day and update me if your BP is above 140/90.

## 2020-05-18 NOTE — Assessment & Plan Note (Signed)
He has renal follow-up pending.  See notes on labs.

## 2020-05-18 NOTE — Assessment & Plan Note (Signed)
Start amlodipine 2.5mg  a day and update me if BP is above 140/90.  He agrees.  See notes on labs.

## 2020-05-18 NOTE — Assessment & Plan Note (Signed)
DM2 hx noted.  Off meds.  Due for f/u A1c.  Statin intolerant.   See notes on labs.

## 2020-05-18 NOTE — Assessment & Plan Note (Signed)
With weight gain noted.  He is going to see about follow-up with orthopedics to see if that will help with his foot pain and then hopefully help with his ability to exercise.  Discussed using exercise bike in the meantime.

## 2020-05-23 ENCOUNTER — Ambulatory Visit: Payer: Medicare Other | Admitting: Orthopedic Surgery

## 2020-05-25 ENCOUNTER — Telehealth: Payer: Self-pay | Admitting: Family Medicine

## 2020-05-25 NOTE — Telephone Encounter (Signed)
Please make sure the patient gets the message below.  According to the EMR, he has not seen the MyChart message.  Thanks. ============================ Your magnesium level was normal. Your creatinine is slightly worse than it had been. We will route this to Dr. Juleen China since you are going to be seeing him in the near future. Your A1c was elevated.  I think we need to recheck your A1c in 3 months. If you can make significant changes in your sugar with diet and exercise then you would not necessarily have to add on medication right now. However if your sugar is not improving in the meantime then restarting medication like glipizide would be reasonable. I do not think it makes sense to wait 3 months before adding glipizide. If you see that you are not able to make significant improvement in the next 2-3 weeks, please let me know. Or if you prefer I can go ahead and send the glipizide prescription now. Either way, please let me know about your thoughts. Take care.

## 2020-05-30 NOTE — Telephone Encounter (Signed)
LMTCB x1 for pt.  

## 2020-05-31 NOTE — Telephone Encounter (Signed)
Spoke with pt regarding Lab results and he said he would let us know about starting medication and he will work on eating habits.  Luis Joseph

## 2020-06-13 ENCOUNTER — Other Ambulatory Visit: Payer: Self-pay

## 2020-06-13 ENCOUNTER — Encounter: Payer: Self-pay | Admitting: Orthopedic Surgery

## 2020-06-13 ENCOUNTER — Ambulatory Visit (INDEPENDENT_AMBULATORY_CARE_PROVIDER_SITE_OTHER): Payer: Medicare Other | Admitting: Orthopedic Surgery

## 2020-06-13 DIAGNOSIS — M6702 Short Achilles tendon (acquired), left ankle: Secondary | ICD-10-CM | POA: Diagnosis not present

## 2020-06-13 DIAGNOSIS — M722 Plantar fascial fibromatosis: Secondary | ICD-10-CM

## 2020-06-13 NOTE — Progress Notes (Signed)
Office Visit Note   Patient: Luis Joseph           Date of Birth: 1956-01-27           MRN: [id number] Visit Date: 06/13/2020              Requested by: Luis Ghent, MD [address] [place],  [place] [address] PCP: Luis Ghent, MD  Chief Complaint  Patient presents with  . Left Foot - Pain      HPI: Patient is a 65 year old gentleman who was seen for initial evaluation for plantar fasciitis on the left patient states the pain is worse with start up in the morning he has had 2 steroid injections which have helped for about 2 weeks.  He is currently wearing Hoka sneakers.  Patient has been followed by Dr. Paulla Joseph.  Assessment & Plan: Visit Diagnoses:  1. Achilles tendon contracture, left   2. Plantar fasciitis     Plan: Patient was given instructions for plantar fascial strengthening with toe raises.  He was also given instructions for Achilles stretching and this was demonstrated.  Recommended a size extra-large knee-high compression stocking for his venous insufficiency and the fungal infection on the heel.  Use Voltaren gel as needed.  Patient is to follow-up with his primary care physician for diabetic management.  Follow-Up Instructions: Return if symptoms worsen or fail to improve.   Ortho Exam  Patient is alert, oriented, no adenopathy, well-dressed, normal affect, normal respiratory effort. Examination patient has good pulses he does have brawny skin color changes in his leg with pitting edema with venous insufficiency.  He has Achilles contracture with dorsiflexion to neutral.  He has point tender to palpation of the origin of the plantar fascia of the tarsal tunnel is nontender to palpation while compression the calcaneus is nontender.  He does have dry cracked skin over the lateral aspect of the heel consistent with a fungal infection.  Hemoglobin A1c is 8.5 patient is not taking any medications for his diabetes.  He currently takes 300 mg of allopurinol  a day for his gout he denies any gout symptoms.  Imaging: No results found. No images are attached to the encounter.  Labs: Lab Results  Component Value Date   HGBA1C 8.5 (H) 05/12/2020   HGBA1C 7.6 (H) 12/14/2019   HGBA1C 8.0 (A) 07/16/2019   LABURIC 5.2 12/14/2019   LABURIC 4.2 11/07/2018   LABURIC 5.1 02/06/2018     Lab Results  Component Value Date   ALBUMIN 4.0 10/09/2016   ALBUMIN 4.1 01/11/2016   ALBUMIN 3.7 06/10/2014   LABURIC 5.2 12/14/2019   LABURIC 4.2 11/07/2018   LABURIC 5.1 02/06/2018    Lab Results  Component Value Date   MG 2.1 05/12/2020   Lab Results  Component Value Date   VD25OH 42.79 06/10/2014    No results found for: PREALBUMIN CBC EXTENDED Latest Ref Rng & Units 12/14/2019 03/18/2018 10/09/2016  WBC 3.8 - 10.8 Thousand/uL 6.0 - 6.7  RBC 4.20 - 5.80 Million/uL 4.06(L) - 4.13(L)  HGB 13.2 - 17.1 g/dL 13.2 13.3 13.5  HCT 38.5 - 50.0 % 38.3(L) - 40.0  PLT 140 - 400 Thousand/uL 205 177 204.0  NEUTROABS 1,500 - 7,800 cells/uL 3,228 - 3.7  LYMPHSABS 850 - 3,900 cells/uL 1,986 - 2.1     There is no height or weight on file to calculate BMI.  Orders:  No orders of the defined types were placed in this encounter.  No orders of the defined types were placed in this encounter.    Procedures: No procedures performed  Clinical Data: No additional findings.  ROS:  All other systems negative, except as noted in the HPI. Review of Systems  Objective: Vital Signs: There were no vitals taken for this visit.  Specialty Comments:  No specialty comments available.  PMFS History: Patient Active Problem List   Diagnosis Date Noted  . Positive colorectal cancer screening using Cologuard test   . 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 Benign hypertensive kidney disease with chronic kidney disease 02/11/2019  . Proteinuria 02/11/2019  . Secondary hyperparathyroidism of renal origin ([place]) 02/11/2019  . Stage 3b chronic kidney disease ([place]) 02/11/2019   . Advance care planning 08/08/2017  . 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 Abnormal findings on imaging test 10/20/2013  . Paresthesia 10/20/2013  . Skin sensation disturbance 10/20/2013  . Hypertrophy of prostate with urinary obstruction and other lower urinary tract symptoms (LUTS) 05/18/2013  . Renal colic [id number]  . 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 Renal stone 04/24/2013  . Medicare welcome exam 09/29/2012  . CKD (chronic kidney disease) 09/29/2012  . Nonspecific findings on examination of blood 09/29/2012  . Migraine headache 08/04/2012  . 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  . Vertigo 07/25/2011  . Cramp of limb 07/25/2011  . Fatigue 06/06/2010  . Malaise and fatigue 06/06/2010  . Type 2 diabetes mellitus with diabetic chronic kidney disease ([place]) 03/17/2010  . Type II diabetes mellitus, uncontrolled ([place]) 03/17/2010  . Rash 10/20/2009  . Dermatitis 10/20/2009  . Anxiety state 06/30/2008  . UNSPECIFIED SLEEP DISTURBANCE 06/10/2008  . Cough 08/27/2006  . Hyperlipidemia 05/21/1988  . Gout 05/21/1985  . Essential hypertension 05/21/1978  . GERD 05/21/1978  . HEADACHE, CLUSTER 05/21/1976   Past Medical History:  Diagnosis Date  . Allergic rhinitis   . Anemia in chronic kidney disease   . CKD (chronic kidney disease), stage III [facility])    NEPHROLOGIST-  DR Luis Joseph  . Diabetes mellitus, type 2 (HCC)    diet controlled  . GERD (gastroesophageal reflux disease) 05/21/1978  . Gout 05/21/1985  . Hematuria   . History of kidney stones   . Hydronephrosis, right   . Hyperlipidemia 05/21/1988  . Hyperparathyroidism, secondary renal ([place])   . Hypertension 05/21/1978  . 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 Renal cyst, left     Family History  Problem Relation Age of Onset  . Heart disease Mother   . Diabetes Mother   . Hypertension Mother   . Cancer Father        Lung  . Diabetes Sister   . Heart disease  Maternal Grandfather   . Diabetes Maternal Grandfather   . Stroke Neg Hx   . Prostate cancer Neg Hx   . Colon cancer Neg Hx     Past Surgical History:  Procedure Laterality Date  . COLONOSCOPY WITH PROPOFOL N/A 03/02/2020   Procedure: COLONOSCOPY WITH PROPOFOL;  Surgeon: Virgel Manifold, MD;  Location: [facility];  Service: Endoscopy;  Laterality: N/A;  priority 3  . CYSTOSCOPY W/ URETERAL STENT PLACEMENT Right 11/25/2012   Procedure: CYSTOSCOPY WITH RETROGRADE PYELOGRAM/URETERAL STENT PLACEMENT;  Surgeon: Hanley Ben, MD;  Location: [place];  Service: Urology;  Laterality: Right;  . CYSTOSCOPY/RETROGRADE/URETEROSCOPY Right 02/10/2013   Procedure: CYSTOSCOPY/RETROGRADE/URETEROSCOPY Right stent removal  Procedure: Cysto, Right Retrograde, Right Stent Pull, Right Diagnostic Ureteroscopy;  Surgeon: Hubbard Robinson  Tresa Moore, MD;  Location: [facility];  Service: Urology;  Laterality: Right;  . GASTRIC ROUX-EN-Y N/A 03/01/2014   Procedure: LAPAROSCOPIC ROUX-EN-Y GASTRIC BYPASS WITH UPPER ENDOSCOPY;  Surgeon: Kaylyn Lim, MD;  [facility];  Service: General;  Laterality: N/A;  . LASIK    . ORIF ANKLE FRACTURE Left 11/14/2016   Procedure: OPEN REDUCTION INTERNAL FIXATION (ORIF) LEFT TRIMALLEOLAR ANKLE FRACTURE;  Surgeon: Leandrew Koyanagi, MD;  Location: [place];  Service: Orthopedics;  Laterality: Left;  . PARTIAL NEPHRECTOMY  12/19/1977   RIGHT PYELOPLASTY AND removal benign cyst and stones   Social History   Occupational History  . Occupation: Retired from [employer], [employer]  Tobacco Use  . Smoking status: Former Smoker    Packs/day: 1.00    Years: 6.00    Pack years: 6.00    Start date: 35    Quit date: 11/17/1977    Years since quitting: 42.6  . Smokeless tobacco: Never Used  Substance and Sexual Activity  . Alcohol use: No    Alcohol/week: 0.0 standard drinks  . Drug use: No  . Sexual activity: Yes

## 2020-06-20 ENCOUNTER — Telehealth: Payer: Self-pay

## 2020-06-20 ENCOUNTER — Encounter: Payer: Self-pay | Admitting: Family Medicine

## 2020-06-20 ENCOUNTER — Other Ambulatory Visit: Payer: Self-pay

## 2020-06-20 ENCOUNTER — Telehealth: Payer: Self-pay | Admitting: Family Medicine

## 2020-06-20 ENCOUNTER — Ambulatory Visit
Admission: EM | Admit: 2020-06-20 | Discharge: 2020-06-20 | Disposition: A | Discharge status: Home / Self Care | Payer: Medicare Other | Attending: Family Medicine | Admitting: Family Medicine

## 2020-06-20 DIAGNOSIS — R1031 Right lower quadrant pain: Secondary | ICD-10-CM

## 2020-06-20 DIAGNOSIS — R109 Unspecified abdominal pain: Secondary | ICD-10-CM

## 2020-06-20 LAB — POCT URINALYSIS DIP (MANUAL ENTRY)
Bilirubin, UA: NEGATIVE
Glucose, UA: 500 mg/dL — AB
Ketones, POC UA: NEGATIVE mg/dL
Leukocytes, UA: NEGATIVE
Nitrite, UA: NEGATIVE
Protein Ur, POC: >=300 mg/dL — AB
Spec Grav, UA: 1.025 (ref 1.010–1.025)
Urobilinogen, UA: 0.2 E.U./dL
pH, UA: 5.5 (ref 5.0–8.0)

## 2020-06-20 NOTE — Telephone Encounter (Signed)
[facility] - Client TELEPHONE ADVICE RECORD Access[facility]urse Patient Name: Luis Joseph Gender: Male DOB: [birth year] Age: 65 Y 75 M 26 D Return Phone Number: [phone] (Primary) Address[place] [address] Client Luis Joseph - Client Client Site [place] Physician Renford Dills - MD Contact Type Call Who Is Calling Patient / Member / Family / Caregiver Call Type Triage / Clinical Relationship To Patient Self Return Phone Number [phone] (Primary) Chief Complaint Abdominal Pain Reason for Call Symptomatic / Request for [place] states wants to schedule appt for today or tomorrow; Having stomach pain for a week; Translation No Nurse Assessment Nurse: Ronnald Ramp, RN, Miranda Date/Time (Eastern Time): 06/20/2020 7:45:14 AM Confirm and document reason for call. If symptomatic, describe symptoms. ---Caller states he has been having pain in his right upper abdomen for the last week. Does the patient have any new or worsening symptoms? ---Yes Will a triage be completed? ---Yes Related visit to physician within the last 2 weeks? ---No Does the PT have any chronic conditions? (i.e. diabetes, asthma, this includes High risk factors for pregnancy, etc.) ---Yes List chronic conditions. ---Diabetes Is this a behavioral health or substance abuse call? ---No Guidelines Guideline Title Affirmed Question Affirmed Notes Nurse Date/Time Eilene Ghazi Time) Abdominal Pain - Upper [1] Pain lasts > 10 minutes AND [2] age > 20 Ronnald Ramp, RN, Miranda 06/20/2020 7:45:46 AM Disp. Time Eilene Ghazi Time) Disposition Final User 06/20/2020 7:26:44 AM Attempt made - message left Ronnald Ramp RN, Miranda 06/20/2020 7:49:00 AM Go to ED Now Yes Ronnald Ramp, RN, Judge Stall Disagree/Comply Disagree Caller Understands Yes PLEASE NOTE: All timestamps contained within this report are  represented as [place] Standard Time. CONFIDENTIALTY NOTICE: This fax transmission is intended only for the addressee. It contains information that is legally privileged, confidential or otherwise protected from use or disclosure. If you are not the intended recipient, you are strictly prohibited from reviewing, disclosing, copying using or disseminating any of this information or taking any action in reliance on or regarding this information. If you have received this fax in error, please notify us immediately by telephone so that we can arrange for its return to [place]. Phone: [phone], Toll-Free: [phone], Fax: [phone] Page: 2 of 2 Call Id: [id number] PreDisposition Call Doctor Care Advice Given Per Guideline GO TO ED NOW: * You need to be seen in the Emergency Department. * Go to the ED at ___________ [place] now. Drive carefully. NOTE TO TRIAGER - DRIVING: * Another adult should drive. BRING MEDICINES: * Bring a list of your current medicines when you go to the Emergency Department (ER). CALL EMS 911 IF: * Confusion occurs * Passes out or becomes too weak to stand * Severe difficulty breathing occurs. CARE ADVICE given per Abdominal Pain, Upper (Adult) guideline. Comments User: Leverne Humbles, RN Date/Time Eilene Ghazi Time): 06/20/2020 7:49:42 AM Caller is going to call the office at 8am to see about an appt. Referrals GO TO FACILITY REFUSED

## 2020-06-20 NOTE — ED Triage Notes (Signed)
Pt c/o RUQ, central abdominal pain, malaise for approx 1 week. States difficulty sleeping 2/2 intense pain. States pain increases at night.   Pt uncertain if he has retained his appendix 2/2 having urethral/kidney surgery approx 40 years and believed he had appendectomy as well, but uncertain. States sent here by his PCP to "rule out appendicitis".   Denies n/v/d, fever, back/flank pain, URI sx, dysuria symptoms.

## 2020-06-20 NOTE — Telephone Encounter (Signed)
Agree.  Thanks.  Will await UC notes.

## 2020-06-20 NOTE — ED Provider Notes (Signed)
Luis Joseph    CSN: [id number] Arrival date & time: 06/20/20  1319      History   Chief Complaint Chief Complaint  Patient presents with  . Abdominal Pain    HPI Luis Joseph is a 65 y.o. male.   Patient is a 65 year old male with past medical history of anemia, chronic kidney disease, diabetes, GERD, gout, hematuria, kidney stones, hydronephrosis, hyperlipidemia, hyperparathyroidism, hypertension, migraines.  He presents today with approximately 1 week of waxing and waning right lower quadrant discomfort.  Reports sometimes radiates over to the left lower quadrant.  He has had some malaise.  Last night pain was more intense but has decreased to approximately level 3 today.  Unsure if still has his appendix.  Denies any fever, chills, nausea, vomiting, diarrhea, constipation, dysuria, hematuria or urinary frequency.     Past Medical History:  Diagnosis Date  . Allergic rhinitis   . Anemia in chronic kidney disease   . CKD (chronic kidney disease), stage III [facility])    NEPHROLOGIST-  DR Lavonia Dana  . Diabetes mellitus, type 2 (HCC)    diet controlled  . GERD (gastroesophageal reflux disease) 05/21/1978  . Gout 05/21/1985  . Hematuria   . History of kidney stones   . Hydronephrosis, right   . Hyperlipidemia 05/21/1988  . Hyperparathyroidism, secondary renal ([place])   . Hypertension 05/21/1978  . 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 Renal cyst, left     Patient Active Problem List   Diagnosis Date Noted  . Positive colorectal cancer screening using Cologuard test   . 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 Benign hypertensive kidney disease with chronic kidney disease 02/11/2019  . Proteinuria 02/11/2019  . Secondary hyperparathyroidism of renal origin ([place]) 02/11/2019  . Stage 3b chronic kidney disease ([place]) 02/11/2019  . Advance care planning 08/08/2017  . 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 Abnormal findings on imaging test  10/20/2013  . Paresthesia 10/20/2013  . Skin sensation disturbance 10/20/2013  . Hypertrophy of prostate with urinary obstruction and other lower urinary tract symptoms (LUTS) 05/18/2013  . Renal colic [id number]  . 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 Renal stone 04/24/2013  . Medicare welcome exam 09/29/2012  . CKD (chronic kidney disease) 09/29/2012  . Nonspecific findings on examination of blood 09/29/2012  . Migraine headache 08/04/2012  . 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  . Vertigo 07/25/2011  . Cramp of limb 07/25/2011  . Fatigue 06/06/2010  . Malaise and fatigue 06/06/2010  . Type 2 diabetes mellitus with diabetic chronic kidney disease ([place]) 03/17/2010  . Type II diabetes mellitus, uncontrolled ([place]) 03/17/2010  . Rash 10/20/2009  . Dermatitis 10/20/2009  . Anxiety state 06/30/2008  . UNSPECIFIED SLEEP DISTURBANCE 06/10/2008  . Cough 08/27/2006  . Hyperlipidemia 05/21/1988  . Gout 05/21/1985  . Essential hypertension 05/21/1978  . GERD 05/21/1978  . HEADACHE, CLUSTER 05/21/1976    Past Surgical History:  Procedure Laterality Date  . COLONOSCOPY WITH PROPOFOL N/A 03/02/2020   Procedure: COLONOSCOPY WITH PROPOFOL;  Surgeon: Virgel Manifold, MD;  Location: [facility] END[facility];  Service: Endoscopy;  Laterality: N/A;  priority 3  . CYSTOSCOPY W/ URETERAL STENT PLACEMENT Right 11/25/2012   Procedure: CYSTOSCOPY WITH RETROGRADE PYELOGRAM/URETERAL STENT PLACEMENT;  Surgeon: Hanley Ben, MD;  Location: [place];  Service: Urology;  Laterality: Right;  . CYSTOSCOPY/RETROGRADE/URETEROSCOPY Right 02/10/2013   Procedure: CYSTOSCOPY/RETROGRADE/URETEROSCOPY Right stent removal  Procedure: Cysto, Right Retrograde, Right Stent Pull,  Right Diagnostic Ureteroscopy;  Surgeon: Alexis Frock, MD;  [facility];  Service: Urology;  Laterality: Right;  . GASTRIC ROUX-EN-Y N/A 03/01/2014   Procedure: LAPAROSCOPIC ROUX-EN-Y  GASTRIC BYPASS WITH UPPER ENDOSCOPY;  Surgeon: Kaylyn Lim, MD;  Location: [facility];  Service: General;  Laterality: N/A;  . LASIK    . ORIF ANKLE FRACTURE Left 11/14/2016   Procedure: OPEN REDUCTION INTERNAL FIXATION (ORIF) LEFT TRIMALLEOLAR ANKLE FRACTURE;  Surgeon: Leandrew Koyanagi, MD;  Location: [place];  Service: Orthopedics;  Laterality: Left;  . PARTIAL NEPHRECTOMY  12/19/1977   RIGHT PYELOPLASTY AND removal benign cyst and stones       Home Medications    Prior to Admission medications   Medication Sig Start Date End Date Taking? Authorizing Provider  allopurinol (ZYLOPRIM) 300 MG tablet Take 1 tablet (300 mg total) by mouth daily. 01/01/20  Yes Tonia Ghent, MD  amLODipine (NORVASC) 2.5 MG tablet Take 1 tablet (2.5 mg total) by mouth daily. 05/12/20  Yes Tonia Ghent, MD  metoprolol succinate (TOPROL-XL) 100 MG 24 hr tablet Take with or immediately following a meal. 01/01/20  Yes Tonia Ghent, MD  telmisartan (MICARDIS) 80 MG tablet Take 1 tablet (80 mg total) by mouth daily. 05/12/20  Yes Tonia Ghent, MD  clotrimazole-betamethasone (LOTRISONE) cream APPLY TO AFFECTED AREA TWICE A DAY 05/12/20   Tonia Ghent, MD  magnesium gluconate (MAGONATE) 500 MG tablet Take by mouth.    [provider]  zinc gluconate 50 MG tablet Take by mouth.    [provider]    Family History Family History  Problem Relation Age of Onset  . Heart disease Mother   . Diabetes Mother   . Hypertension Mother   . Cancer Father        Lung  . Diabetes Sister   . Heart disease Maternal Grandfather   . Diabetes Maternal Grandfather   . Stroke Neg Hx   . Prostate cancer Neg Hx   . Colon cancer Neg Hx     Social History Social History   Tobacco Use  . Smoking status: Former Smoker    Packs/day: 1.00    Years: 6.00    Pack years: 6.00    Start date: 45    Quit date: 11/17/1977    Years since quitting: 42.6  . Smokeless tobacco: Never Used   Substance Use Topics  . Alcohol use: No    Alcohol/week: 0.0 standard drinks  . Drug use: No     Allergies   Codeine sulfate, Sildenafil, Crestor [rosuvastatin], and Nsaids   Review of Systems Review of Systems   Physical Exam Triage Vital Signs ED Triage Vitals  Enc Vitals Group     BP 06/20/20 1334 134/84     Pulse Rate 06/20/20 1334 73     Resp 06/20/20 1334 18     Temp 06/20/20 1334 98.4 F (36.9 C)     Temp Source 06/20/20 1334 Oral     SpO2 06/20/20 1334 95 %     Weight --      Height --      Head Circumference --      Peak Flow --      Pain Score 06/20/20 1330 3     Pain Loc --      Pain Edu? --      Excl. in [place]? --    No data found.  Updated Vital Signs BP 134/84 (BP Location:  Left Arm)   Pulse 73   Temp 98.4 F (36.9 C) (Oral)   Resp 18   SpO2 95%   Visual Acuity Right Eye Distance:   Left Eye Distance:   Bilateral Distance:    Right Eye Near:   Left Eye Near:    Bilateral Near:     Physical Exam Vitals and nursing note reviewed.  Constitutional:      Appearance: Normal appearance.  HENT:     Head: Normocephalic and atraumatic.  Eyes:     Conjunctiva/sclera: Conjunctivae normal.  Pulmonary:     Effort: Pulmonary effort is normal.  Abdominal:     Palpations: Abdomen is soft.     Tenderness: There is abdominal tenderness. There is no right CVA tenderness, left CVA tenderness, guarding or rebound.     Comments: Right mid abdomen and RLQ pain without rebound or guarding. Non tender to RUQ Non tender to LLQ or umbilicus.   Musculoskeletal:        General: Normal range of motion.     Cervical back: Normal range of motion.  Skin:    General: Skin is warm and dry.  Neurological:     Mental Status: He is alert.  Psychiatric:        Mood and Affect: Mood normal.      UC Treatments / Results  Labs (all labs ordered are listed, but only abnormal results are displayed) Labs Reviewed  POCT URINALYSIS DIP (MANUAL ENTRY) - Abnormal;  Notable for the following components:      Result Value   Glucose, UA =500 (*)    Blood, UA trace-intact (*)    Protein Ur, POC >=300 (*)    All other components within normal limits  CBC WITH DIFFERENTIAL/PLATELET  COMPREHENSIVE METABOLIC PANEL  LIPASE  AMYLASE    EKG   Radiology No results found.  Procedures Procedures (including critical care time)  Medications Ordered in UC Medications - No data to display  Initial Impression / Assessment and Plan / UC Course  I have reviewed the triage vital signs and the nursing notes.  Pertinent labs & imaging results that were available during my care of the patient were reviewed by me and considered in my medical decision making (see chart for details).     Right mid and lower quadrant abdominal pain.  This has been waxing and waning over the past week.  Concern for appendicitis versus diverticulitis.  No other concerning signs, symptoms or red flags today.  He is afebrile.  Spoke with his primary care doctor via telephone about case.  Due to long ER wait times recommended outpatient CT scan.  We are unable to order this here.  Patient primary care doctor will order this outpatient.  Drawing some abdominal lab work.  Urine with protein, glucose in trace blood.  No concerns for infection or pyelonephritis at this time. Strict ER precautions given that if he develops any worsening symptoms to go to the ER.  Patient understanding and agree. Yet heFinal Clinical Impressions(s) / UC Diagnoses   Final diagnoses:  Right lower quadrant abdominal pain     Discharge Instructions     We are checking some labs Your doctor is trying to order outpatient CT scan.  If your pain worsens or you develop fever, vomiting or more severe pain please go straight to the ER.     ED Prescriptions    None     PDMP not reviewed this encounter.   Rozanna Box, Jeniyah Menor A,  NP 06/20/20 1451

## 2020-06-20 NOTE — Telephone Encounter (Signed)
Per chart review pt has already scheduled appt with Dr Einar Pheasant on 06/21/20. Pt said has stabby pain to the right of naval consistent that started one wk ago; pain is worse and keeping pt up at night; no available appts at [facility]. Pt said when pushes in on rt abd makes the pain worse. Pain level now is a 3 but last night 6 -7 but pain has worsened over the past week. Pt does not want to go to UC or ED today. I spoke with Dr Damita Dunnings and he said best for pt to be seen today and testing. Pt said he had partial nephrectomy and is not sure if took appendix or not; no appendectomy on pts surgical hx. Pt said to cancel appt with Dr Einar Pheasant and he will go to [facility] in [place] for eval and testing. Sending note to Dr Damita Dunnings.

## 2020-06-20 NOTE — Discharge Instructions (Addendum)
We are checking some labs Your doctor is trying to order outpatient CT scan.  If your pain worsens or you develop fever, vomiting or more severe pain please go straight to the ER.

## 2020-06-20 NOTE — Telephone Encounter (Signed)
Call from [facility] at [facility].  Patient is there with u/a, cbc, cbmet, lipase pending.  She is unable to arrange for outpatient CT to eval for appendicitis.  Patient reportedly declined ER.  She called to see if I can order the CT.  He had h/o elevated Cr at baseline.  I put in the order and will discuss with referral staff here.  Per report, the patient appeared stable for outpatient f/u.  I thank all involved.

## 2020-06-20 NOTE — Addendum Note (Signed)
Addended by: Virl Cagey on: 06/20/2020 02:46 PM   Modules accepted: Orders

## 2020-06-20 NOTE — Telephone Encounter (Signed)
Pt is scheduled with [facility] ([place] location)  on Tuesday 06/21/20 at 10:20a  Spoke with patient and gave appt information - NPO 4hr prior, H20 okay.  Pick up contrast bottles TODAY before 5pm, drink one bottle at 8:40a, the other at 9:40a - do not pour over ice. May keep in fridge to keep cold.   Nothing further needed.

## 2020-06-21 ENCOUNTER — Ambulatory Visit
Admission: RE | Admit: 2020-06-21 | Discharge: 2020-06-21 | Disposition: A | Discharge status: Home / Self Care | Payer: Medicare Other | Source: Ambulatory Visit | Attending: Family Medicine | Admitting: Family Medicine

## 2020-06-21 ENCOUNTER — Ambulatory Visit: Payer: Medicare Other | Admitting: Family Medicine

## 2020-06-21 DIAGNOSIS — R109 Unspecified abdominal pain: Secondary | ICD-10-CM

## 2020-06-21 LAB — CBC WITH DIFFERENTIAL/PLATELET
Basophils Absolute: 0.1 10*3/uL (ref 0.0–0.2)
Basos: 1 %
EOS (ABSOLUTE): 0.2 10*3/uL (ref 0.0–0.4)
Eos: 3 %
Hematocrit: 41.6 % (ref 37.5–51.0)
Hemoglobin: 13.7 g/dL (ref 13.0–17.7)
Immature Grans (Abs): 0 10*3/uL (ref 0.0–0.1)
Immature Granulocytes: 1 %
Lymphocytes Absolute: 2.2 10*3/uL (ref 0.7–3.1)
Lymphs: 27 %
MCH: 32 pg (ref 26.6–33.0)
MCHC: 32.9 g/dL (ref 31.5–35.7)
MCV: 97 fL (ref 79–97)
Monocytes Absolute: 0.6 10*3/uL (ref 0.1–0.9)
Monocytes: 7 %
Neutrophils Absolute: 5.1 10*3/uL (ref 1.4–7.0)
Neutrophils: 61 %
Platelets: 237 10*3/uL (ref 150–450)
RBC: 4.28 x10E6/uL (ref 4.14–5.80)
RDW: 12 % (ref 11.6–15.4)
WBC: 8.2 10*3/uL (ref 3.4–10.8)

## 2020-06-21 LAB — COMPREHENSIVE METABOLIC PANEL
ALT: 16 IU/L (ref 0–44)
AST: 11 IU/L (ref 0–40)
Albumin/Globulin Ratio: 1.2 (ref 1.2–2.2)
Albumin: 3.8 g/dL (ref 3.8–4.8)
Alkaline Phosphatase: 129 IU/L — ABNORMAL HIGH (ref 44–121)
BUN/Creatinine Ratio: 23 (ref 10–24)
BUN: 54 mg/dL — ABNORMAL HIGH (ref 8–27)
Bilirubin Total: 0.3 mg/dL (ref 0.0–1.2)
CO2: 18 mmol/L — ABNORMAL LOW (ref 20–29)
Calcium: 9.5 mg/dL (ref 8.6–10.2)
Chloride: 99 mmol/L (ref 96–106)
Creatinine, Ser: 2.36 mg/dL — ABNORMAL HIGH (ref 0.76–1.27)
GFR calc Af Amer: 32 mL/min/{1.73_m2} — ABNORMAL LOW (ref >59)
GFR calc non Af Amer: 28 mL/min/{1.73_m2} — ABNORMAL LOW (ref >59)
Globulin, Total: 3.2 g/dL (ref 1.5–4.5)
Glucose: 504 mg/dL (ref 65–99)
Potassium: 5.5 mmol/L — ABNORMAL HIGH (ref 3.5–5.2)
Sodium: 133 mmol/L — ABNORMAL LOW (ref 134–144)
Total Protein: 7 g/dL (ref 6.0–8.5)

## 2020-06-21 LAB — AMYLASE: Amylase: 105 U/L (ref 31–110)

## 2020-06-21 LAB — LIPASE: Lipase: 172 U/L — ABNORMAL HIGH (ref 13–78)

## 2020-06-21 NOTE — Telephone Encounter (Signed)
Noted.  Will await the imaging report.

## 2020-06-22 ENCOUNTER — Telehealth: Payer: Self-pay | Admitting: Family Medicine

## 2020-06-22 MED ORDER — FREESTYLE TEST VI STRP: ORAL_STRIP | 12 refills | Status: DC

## 2020-06-22 MED ORDER — FREESTYLE SYSTEM KIT: PACK | 0 refills | Status: DC

## 2020-06-22 MED ORDER — DAPAGLIFLOZIN PROPANEDIOL 5 MG PO TABS: 5.0000 mg | ORAL_TABLET | Freq: Every day | ORAL | Status: DC

## 2020-06-22 MED ORDER — PANTOPRAZOLE SODIUM 40 MG PO TBEC: 40.0000 mg | DELAYED_RELEASE_TABLET | Freq: Every day | ORAL | Status: DC

## 2020-06-22 MED ORDER — FREESTYLE LANCETS MISC: 12 refills | Status: DC

## 2020-06-22 NOTE — Telephone Encounter (Signed)
Spoke with patient about message below. He will try to pick up the prescriptions today if they are ready before he leaves town. He will be out of town until the 13th. I have scheduled his f/u for 07/04/20 at 10:30 am.

## 2020-06-22 NOTE — Telephone Encounter (Signed)
Please check with patient.  I updated his med list after his visit at the kidney clinic.  I sent a prescription for meter/lancets/strips.  Have him start checking his sugar and let me know how that is tomorrow.  Drink plenty fluids, specifically water.  Avoid sugary foods and bread, and if he feels worse go to the emergency room.  He needs to follow-up here set in the near future.  Thanks.

## 2020-07-04 ENCOUNTER — Ambulatory Visit (INDEPENDENT_AMBULATORY_CARE_PROVIDER_SITE_OTHER): Payer: Medicare Other | Admitting: Family Medicine

## 2020-07-04 ENCOUNTER — Other Ambulatory Visit: Payer: Self-pay

## 2020-07-04 ENCOUNTER — Encounter: Payer: Self-pay | Admitting: Family Medicine

## 2020-07-04 VITALS — BP 112/76 | HR 78 | Temp 97.8°F | Ht 70.0 in | Wt 245.0 lb

## 2020-07-04 DIAGNOSIS — E1165 Type 2 diabetes mellitus with hyperglycemia: Secondary | ICD-10-CM

## 2020-07-04 DIAGNOSIS — R748 Abnormal levels of other serum enzymes: Secondary | ICD-10-CM | POA: Diagnosis not present

## 2020-07-04 DIAGNOSIS — E119 Type 2 diabetes mellitus without complications: Secondary | ICD-10-CM

## 2020-07-04 LAB — POCT GLYCOSYLATED HEMOGLOBIN (HGB A1C): Hemoglobin A1C: 11.5 % — AB (ref 4.0–5.6)

## 2020-07-04 NOTE — Patient Instructions (Addendum)
Let me know how your sugar is running in the next few days.  Go to the lab on the way out.   If you have mychart we'll likely use that to update you.    Take care.  Glad to see you. Don't change your meds for now.  We'll be in touch about options.

## 2020-07-04 NOTE — Progress Notes (Signed)
This visit occurred during the SARS-CoV-2 public health emergency.  Safety protocols were in place, including screening questions prior to the visit, additional usage of staff PPE, and extensive cleaning of exam room while observing appropriate contact time as indicated for disinfecting solutions.  He has h/o gastric bypass.  He had lipase elevation.  A1c 11.5.  Recent labs D/w pt at [facility].  He is going to check on getting his meter in the meantime and update me about his sugar.  We talked about treatment options regarding diabetes, with a likely restart of basal insulin.  He knows how to inject himself.  Discussed.  Recent CT with Pancreas: Unremarkable. No pancreatic ductal dilatation or surrounding inflammatory changes.  Unclear if he has a chronic mild lipase elevation related to previous gastric bypass surgery.  No abd pain.  He feels well o/w.  No FCNAVD.  He doesn't have greasy stools.    On pantoprazole and now on farxiga.    See notes on follow-up labs.  Meds, vitals, and allergies reviewed.   ROS: Per HPI unless specifically indicated in ROS section   GEN: nad, alert and oriented HEENT: ncat NECK: supple w/o LA CV: rrr PULM: ctab, no inc wob ABD: soft, +bs, not tender to palpation. EXT: no edema SKIN: Well-perfused.

## 2020-07-05 ENCOUNTER — Other Ambulatory Visit: Payer: Self-pay

## 2020-07-05 ENCOUNTER — Other Ambulatory Visit: Payer: Self-pay | Admitting: Family Medicine

## 2020-07-05 ENCOUNTER — Telehealth: Payer: Self-pay | Admitting: Radiology

## 2020-07-05 ENCOUNTER — Encounter: Payer: Self-pay | Admitting: Family Medicine

## 2020-07-05 DIAGNOSIS — E119 Type 2 diabetes mellitus without complications: Secondary | ICD-10-CM

## 2020-07-05 LAB — BASIC METABOLIC PANEL WITH GFR
BUN/Creatinine Ratio: 22 (calc) (ref 6–22)
BUN: 63 mg/dL — ABNORMAL HIGH (ref 7–25)
CO2: 19 mmol/L — ABNORMAL LOW (ref 20–32)
Calcium: 9.4 mg/dL (ref 8.6–10.3)
Chloride: 103 mmol/L (ref 98–110)
Creat: 2.9 mg/dL — ABNORMAL HIGH (ref 0.70–1.25)
Glucose, Bld: 441 mg/dL — ABNORMAL HIGH (ref 65–99)
Potassium: 5.5 mmol/L — ABNORMAL HIGH (ref 3.5–5.3)
Sodium: 133 mmol/L — ABNORMAL LOW (ref 135–146)

## 2020-07-05 LAB — LIPASE: Lipase: 150 U/L — ABNORMAL HIGH (ref 7–60)

## 2020-07-05 MED ORDER — PEN NEEDLES 30G X 5 MM MISC: 3 refills | Status: DC

## 2020-07-05 MED ORDER — LANTUS SOLOSTAR 100 UNIT/ML ~~LOC~~ SOPN: 5.0000 [IU] | PEN_INJECTOR | Freq: Every day | SUBCUTANEOUS | 99 refills | Status: DC

## 2020-07-05 MED ORDER — INSULIN ASPART 100 UNIT/ML ~~LOC~~ SOLN: SUBCUTANEOUS | 99 refills | Status: DC

## 2020-07-05 NOTE — Telephone Encounter (Signed)
Please change to basaglar, no change in sig.  Thanks.

## 2020-07-05 NOTE — Telephone Encounter (Signed)
[facility] called a critical Glucose - 441. Results given to Dr Damita Dunnings

## 2020-07-05 NOTE — Telephone Encounter (Signed)
Rx has been changed and sent

## 2020-07-05 NOTE — Progress Notes (Signed)
Rxs sent to wrong pharmacy this morning. Rxs resent to [facility] [place].

## 2020-07-05 NOTE — Telephone Encounter (Signed)
Noted.  This is improved from prev and we are addressing with insulin.

## 2020-07-05 NOTE — Telephone Encounter (Signed)
Lantus is not covered by insurance; do you want to change to an alternative that is covered? Suggested covered alternatives are basaglar or levemir.

## 2020-07-06 ENCOUNTER — Telehealth: Payer: Self-pay

## 2020-07-06 MED ORDER — FREESTYLE LIBRE 2 READER DEVI: 0 refills | Status: DC

## 2020-07-06 MED ORDER — FREESTYLE LIBRE 14 DAY SENSOR MISC: 3 refills | Status: DC

## 2020-07-06 MED ORDER — BASAGLAR KWIKPEN 100 UNIT/ML ~~LOC~~ SOPN: PEN_INJECTOR | SUBCUTANEOUS | 3 refills | Status: DC

## 2020-07-06 NOTE — Telephone Encounter (Addendum)
Sent. Thanks.   Please call pharmacy.  My initial prescription for insulin was Lantus.  If they cannot fill this, but if they can fill it with Basaglar, then proceed with Basaglar.  His med list in the meantime showed NovoLog.  I took that off the med list.  He should either be on Lantus or Basaglar but not NovoLog.  Thanks.

## 2020-07-06 NOTE — Addendum Note (Signed)
Addended by: Tonia Ghent on: 07/06/2020 02:17 PM   Modules accepted: Orders

## 2020-07-06 NOTE — Addendum Note (Signed)
Addended by: Sherrilee Gilles B on: 07/06/2020 05:03 PM   Modules accepted: Orders

## 2020-07-06 NOTE — Assessment & Plan Note (Signed)
See notes on labs for  Would restart lantus insulin. Pen rx sent. Start with 5 units at night. If AM sugar is still above 150, then add 1 unit per day (5----->6----->7----->8----->9----->etc units per day).  Potassium is elevated but I wouldn't intervene until his sugar is lower. Needs recheck labs in 1 week. I put in the follow up orders. Plan is to drink enough water to keep urine clear or light colored.   His lipase is elevated but w/o pain I presume this to be his baseline.   At this point still okay for outpatient follow-up as I expect his sugar to improve with insulin addition.  See notes on follow-up creatinine.  We will route his labs to renal clinic as FYI.  I would expect his creatinine to improve some with correction of hyperglycemia.

## 2020-07-06 NOTE — Telephone Encounter (Signed)
[facility] and [facility] basaglar but the novolog was already picked up by patient yesterday. Do you want to change it?

## 2020-07-06 NOTE — Telephone Encounter (Signed)
Basaglar rx has been sent to the pharmacy. Called patient and left him a VM about this; advised patient to call back if has any issues or questions.

## 2020-07-06 NOTE — Telephone Encounter (Signed)
Yes.  Fill the [place].  Do not have him use the NovoLog yet.  If he already used NovoLog then start the [employer] but do not repeat the NovoLog yet.

## 2020-07-06 NOTE — Telephone Encounter (Signed)
Patient is requesting prescriptions for [facility] reader and sensors [facility] [place].

## 2020-07-07 NOTE — Telephone Encounter (Signed)
I would hold the novolog for now and try to switch to basaglar asap.  Thanks.

## 2020-07-07 NOTE — Telephone Encounter (Signed)
Patient called back this morning about the medication change. Patient does not mind getting the new prescription but he did pay $140 for the novolog; im still not sure what happened and how that one got sent instead of the basaglar. He wanted to see if he could just use that for now since hes already paid so much for it then get the [place]? I called the pharmacy and its gonna be $75 for that one. He's leaving to go out of town this morning and needs to see what he needs to do.

## 2020-07-07 NOTE — Telephone Encounter (Signed)
Spoke with patient and he is going to do as advised. He went ahead and picked up basaglar this morning.

## 2020-07-13 ENCOUNTER — Other Ambulatory Visit (INDEPENDENT_AMBULATORY_CARE_PROVIDER_SITE_OTHER): Payer: Medicare Other

## 2020-07-13 ENCOUNTER — Other Ambulatory Visit: Payer: Self-pay

## 2020-07-13 DIAGNOSIS — E119 Type 2 diabetes mellitus without complications: Secondary | ICD-10-CM | POA: Diagnosis not present

## 2020-07-13 LAB — BASIC METABOLIC PANEL
BUN/Creatinine Ratio: 21 (calc) (ref 6–22)
BUN: 50 mg/dL — ABNORMAL HIGH (ref 7–25)
CO2: 21 mmol/L (ref 20–32)
Calcium: 9.1 mg/dL (ref 8.6–10.3)
Chloride: 105 mmol/L (ref 98–110)
Creat: 2.37 mg/dL — ABNORMAL HIGH (ref 0.70–1.25)
Glucose, Bld: 246 mg/dL — ABNORMAL HIGH (ref 65–99)
Potassium: 4.7 mmol/L (ref 3.5–5.3)
Sodium: 136 mmol/L (ref 135–146)

## 2020-07-13 NOTE — Addendum Note (Signed)
Addended by: Ellamae Sia on: 07/13/2020 08:35 AM   Modules accepted: Orders

## 2020-08-12 ENCOUNTER — Other Ambulatory Visit: Payer: Self-pay

## 2020-08-12 ENCOUNTER — Ambulatory Visit (INDEPENDENT_AMBULATORY_CARE_PROVIDER_SITE_OTHER): Payer: Medicare Other | Admitting: Family Medicine

## 2020-08-12 ENCOUNTER — Encounter: Payer: Self-pay | Admitting: Family Medicine

## 2020-08-12 VITALS — BP 112/78 | HR 70 | Temp 97.1°F | Ht 70.0 in | Wt 244.0 lb

## 2020-08-12 DIAGNOSIS — E119 Type 2 diabetes mellitus without complications: Secondary | ICD-10-CM | POA: Diagnosis not present

## 2020-08-12 DIAGNOSIS — N1832 Chronic kidney disease, stage 3b: Secondary | ICD-10-CM

## 2020-08-12 DIAGNOSIS — E1122 Type 2 diabetes mellitus with diabetic chronic kidney disease: Secondary | ICD-10-CM | POA: Diagnosis not present

## 2020-08-12 MED ORDER — DAPAGLIFLOZIN PROPANEDIOL 10 MG PO TABS: 10.0000 mg | ORAL_TABLET | Freq: Every day | ORAL | Status: DC

## 2020-08-12 NOTE — Patient Instructions (Addendum)
Increase farxiga to '10mg'$  a day.  If you have any low sugars, then cut back on your insulin.  Make sure to drink enough water to keep your urine clear or light colored.  Let me know if you don't tolerate the higher dose of farxiga.   If sugar below 100, cut back 1 unit of insulin per day.   Check with your insurance to see if they will cover the shingrix shot. It may be cheaper at the pharmacy.

## 2020-08-12 NOTE — Progress Notes (Signed)
This visit occurred during the SARS-CoV-2 public health emergency.  Safety protocols were in place, including screening questions prior to the visit, additional usage of staff PPE, and extensive cleaning of exam room while observing appropriate contact time as indicated for disinfecting solutions.  Diabetes follow-up.  Most sugars recently below 180.  224 nonfasting this AM.  His current meter is working well and it has helped him maintain control of his sugar.  Taking insulin 18 units at night and farxiga '5mg'$  daily.  We talked about inc farxiga per renal rec- see avs.  He has been working hard to cut back on carbs.  His wife is helping him stick with his diet.    He had f/u with [facility] in June.  Nocturia is down from 3 to once per night, as sugar improved.   Meds, vitals, and allergies reviewed.   ROS: Per HPI unless specifically indicated in ROS section   GEN: nad, alert and oriented HEENT:ncat NECK: supple w/o LA CV: rrr.  PULM: ctab, no inc wob ABD: soft, +bs EXT: no edema SKIN: Well-perfused.

## 2020-08-13 LAB — BASIC METABOLIC PANEL
BUN/Creatinine Ratio: 24 (calc) — ABNORMAL HIGH (ref 6–22)
BUN: 56 mg/dL — ABNORMAL HIGH (ref 7–25)
CO2: 17 mmol/L — ABNORMAL LOW (ref 20–32)
Calcium: 9.3 mg/dL (ref 8.6–10.3)
Chloride: 109 mmol/L (ref 98–110)
Creat: 2.32 mg/dL — ABNORMAL HIGH (ref 0.70–1.25)
Glucose, Bld: 193 mg/dL — ABNORMAL HIGH (ref 65–99)
Potassium: 4.9 mmol/L (ref 3.5–5.3)
Sodium: 137 mmol/L (ref 135–146)

## 2020-08-14 NOTE — Assessment & Plan Note (Signed)
See notes on labs Increase farxiga to '10mg'$  a day.  If any low sugars, then cut back on insulin by 1 unit/day.  He will let me know if he does not tolerate the higher dose of farxiga.

## 2020-10-30 ENCOUNTER — Telehealth: Payer: Self-pay | Admitting: Family Medicine

## 2020-10-30 NOTE — Telephone Encounter (Signed)
Needs DM2 f/u.  Please schedule.  We can do labs at the visit.  Thanks.

## 2020-11-24 ENCOUNTER — Other Ambulatory Visit: Payer: Self-pay | Admitting: Family Medicine

## 2020-11-29 ENCOUNTER — Other Ambulatory Visit: Payer: Self-pay | Admitting: Family Medicine

## 2020-12-14 ENCOUNTER — Encounter: Payer: Self-pay | Admitting: Primary Care

## 2020-12-14 ENCOUNTER — Telehealth (INDEPENDENT_AMBULATORY_CARE_PROVIDER_SITE_OTHER): Payer: Medicare Other | Admitting: Primary Care

## 2020-12-14 VITALS — Temp 97.8°F | Ht 70.0 in | Wt 244.0 lb

## 2020-12-14 DIAGNOSIS — U071 COVID-19: Secondary | ICD-10-CM | POA: Insufficient documentation

## 2020-12-14 MED ORDER — MOLNUPIRAVIR EUA 200MG CAPSULE: 4.0000 | ORAL_CAPSULE | Freq: Two times a day (BID) | ORAL | 0 refills | Status: AC

## 2020-12-14 MED ORDER — BENZONATATE 200 MG PO CAPS: 200.0000 mg | ORAL_CAPSULE | Freq: Three times a day (TID) | ORAL | 0 refills | Status: DC | PRN

## 2020-12-14 NOTE — Assessment & Plan Note (Signed)
Within window for treatment. Given medical history he should certainly be treated with antiviral medication. He does not have a copy of his home test.  History of CKD so molnupiravir is better suited.  Rx for [employer] provided to use PRN cough.   Return precautions provided. He is stable for outpatient treatment.

## 2020-12-14 NOTE — Progress Notes (Signed)
Patient ID: Luis Joseph, male    DOB: 03/11/1956, 65 y.o.   MRN: [id number]  Virtual visit completed through [employer], a video enabled telemedicine application. Due to national recommendations of social distancing due to COVID-19, a virtual visit is felt to be most appropriate for this patient at this time. Reviewed limitations, risks, security and privacy concerns of performing a virtual visit and the availability of in person appointments. I also reviewed that there may be a patient responsible charge related to this service. The patient agreed to proceed.   Patient location: home Provider location: [place] at [facility], office Persons participating in this virtual visit: patient, provider   If any vitals were documented, they were collected by patient at home unless specified below.    Temp 97.8 F (36.6 C) (Temporal)   Ht '5\' 10"'$  (1.778 m)   Wt 244 lb (110.7 kg)   BMI 35.01 kg/m    CC: Positive Covid Subjective:   HPI: Luis Joseph is a 65 y.o. male patient of Dr. Damita Dunnings with a history of hypertension, type 2 diabetes, hyperparathyroidism, CKD, hyperlipidemia presenting on 12/14/2020 for positive Covid evaluation.   Symptoms began 12/11/20 with fatigue and weakness. He then progressed to sore throat, cough, headache. He denies fevers. He tested positive two days ago with a home test. Does not have a picture of the test result.   He's been taking Tylenol, throat lozenges, and OTC cough medication with little improvement. His most bothersome symptom is sore throat and cough. He's had three Covid-19 vaccines.       Relevant past medical, surgical, family and social history reviewed and updated as indicated. Interim medical history since our last visit reviewed. Allergies and medications reviewed and updated. Outpatient Medications Prior to Visit  Medication Sig Dispense Refill   allopurinol (ZYLOPRIM) 300 MG tablet TAKE 1 TABLET BY MOUTH EVERY DAY 90 tablet 1    amLODipine (NORVASC) 2.5 MG tablet Take 1 tablet (2.5 mg total) by mouth daily. 90 tablet 3   clotrimazole-betamethasone (LOTRISONE) cream APPLY TO AFFECTED AREA TWICE A DAY 45 g 3   Continuous Blood Gluc Receiver (FREESTYLE LIBRE 2 READER) DEVI Use as directed to check sugar multiple times per day.  Diagnosis E 11.9 with hyperglycemia.  Treated with insulin. 1 each 0   Continuous Blood Gluc Sensor (FREESTYLE LIBRE 14 DAY SENSOR) MISC Use as directed to check sugar multiple times per day.  Diagnosis E 11.9 with hyperglycemia.  Treated with insulin. 6 each 3   dapagliflozin propanediol (FARXIGA) 10 MG TABS tablet Take 1 tablet (10 mg total) by mouth daily before breakfast.     Insulin Glargine (BASAGLAR KWIKPEN) 100 UNIT/ML Inject 5-20 Units into the skin daily. Start with 5 units at night.  If AM sugar>150, then add 1 unit per day 10 mL 3   Insulin Pen Needle (PEN NEEDLES) 30G X 5 MM MISC Use daily with insulin injection 100 each 3   magnesium gluconate (MAGONATE) 500 MG tablet Take by mouth.     metoprolol succinate (TOPROL-XL) 100 MG 24 hr tablet TAKE 1 TABLET BY MOUTH EVERY DAY WITH OR IMMEDIATELY FOLLOWING A MEAL 90 tablet 1   telmisartan (MICARDIS) 80 MG tablet Take 1 tablet (80 mg total) by mouth daily.     pantoprazole (PROTONIX) 40 MG tablet Take 1 tablet (40 mg total) by mouth daily. (Patient not taking: Reported on 12/14/2020)     zinc gluconate 50 MG tablet Take by mouth. (Patient not  taking: Reported on 12/14/2020)     No facility-administered medications prior to visit.     Per HPI unless specifically indicated in ROS section below Review of Systems  Constitutional:  Positive for fatigue. Negative for fever.  HENT:  Positive for sore throat. Negative for congestion.   Respiratory:  Positive for cough. Negative for shortness of breath.   Neurological:  Negative for headaches.  Objective:  Temp 97.8 F (36.6 C) (Temporal)   Ht '5\' 10"'$  (1.778 m)   Wt 244 lb (110.7 kg)   BMI 35.01  kg/m   Wt Readings from Last 3 Encounters:  12/14/20 244 lb (110.7 kg)  08/12/20 244 lb (110.7 kg)  07/04/20 245 lb (111.1 kg)       Physical exam: Gen: alert, NAD, appears tired/ill. Pulm: speaks in complete sentences without increased work of breathing, no cough during visit. Psych: normal mood, normal thought content      Results for orders placed or performed in visit on [id number]  Basic metabolic panel  Result Value Ref Range   Glucose, Bld 193 (H) 65 - 99 mg/dL   BUN 56 (H) 7 - 25 mg/dL   Creat 2.32 (H) 0.70 - 1.25 mg/dL   BUN/Creatinine Ratio 24 (H) 6 - 22 (calc)   Sodium 137 135 - 146 mmol/L   Potassium 4.9 3.5 - 5.3 mmol/L   Chloride 109 98 - 110 mmol/L   CO2 17 (L) 20 - 32 mmol/L   Calcium 9.3 8.6 - 10.3 mg/dL   Assessment & Plan:   Problem List Items Addressed This Visit       Other   COVID-19 virus infection - Primary    Within window for treatment. Given medical history he should certainly be treated with antiviral medication. He does not have a copy of his home test.  History of CKD so molnupiravir is better suited.  Rx for [employer] provided to use PRN cough.   Return precautions provided. He is stable for outpatient treatment.        Relevant Medications   molnupiravir EUA 200 mg CAPS   benzonatate (TESSALON) 200 MG capsule     Meds ordered this encounter  Medications   molnupiravir EUA 200 mg CAPS    Sig: Take 4 capsules (800 mg total) by mouth 2 (two) times daily for 5 days.    Dispense:  40 capsule    Refill:  0    Order Specific Question:   Supervising Provider    Answer:   BEDSOLE, AMY E [[id number]]   benzonatate (TESSALON) 200 MG capsule    Sig: Take 1 capsule (200 mg total) by mouth 3 (three) times daily as needed for cough.    Dispense:  15 capsule    Refill:  0    Order Specific Question:   Supervising Provider    Answer:   BEDSOLE, AMY E [[id number]]   No orders of the defined types were placed in this encounter.   I discussed the  assessment and treatment plan with the patient. The patient was provided an opportunity to ask questions and all were answered. The patient agreed with the plan and demonstrated an understanding of the instructions. The patient was advised to call back or seek an in-person evaluation if the symptoms worsen or if the condition fails to improve as anticipated.  Follow up plan:  Start molnupiravir tablets for Covid-19 infection. Take 4 tablets by mouth twice daily. See package instructions.  You may take Benzonatate capsules  for cough. Take 1 capsule by mouth three times daily as needed for cough.  It was a pleasure meeting you!   Pleas Koch, NP

## 2020-12-14 NOTE — Patient Instructions (Signed)
Start molnupiravir tablets for Covid-19 infection. Take 4 tablets by mouth twice daily. See package instructions.  You may take Benzonatate capsules for cough. Take 1 capsule by mouth three times daily as needed for cough.  It was a pleasure meeting you!

## 2021-01-27 DIAGNOSIS — R0600 Dyspnea, unspecified: Secondary | ICD-10-CM | POA: Insufficient documentation

## 2021-01-27 LAB — HEMOGLOBIN A1C: Hemoglobin A1C: 8

## 2021-01-27 LAB — BASIC METABOLIC PANEL
Creatinine: 2.5 — AB (ref <=1.3)
Glucose: 178
Potassium: 5.7 — AB (ref 3.4–5.3)
Sodium: 142 (ref 137–147)

## 2021-01-27 LAB — CBC AND DIFFERENTIAL
Hemoglobin: 14.7 (ref 13.5–17.5)
Platelets: 231 (ref 150–399)

## 2021-01-27 LAB — HEPATIC FUNCTION PANEL
ALT: 28 (ref 10–40)
AST: 33 (ref 14–40)

## 2021-01-30 ENCOUNTER — Telehealth: Payer: Self-pay | Admitting: Family Medicine

## 2021-01-30 NOTE — Telephone Encounter (Signed)
Pt called in stating he went to the hospital Friday night and they recommended he get a stress test. He states 3 of the doctors stated it was imperative for him to receive this, but they did not have a room for him. He is requesting a referral for cardiology to get this completed. I advised patient we will need to schedule an appointment to get the referral. He states he does not want an appointment as he now lives in [place]. He agreed to have a virtual visit on Monday, 9/19. He wants to know if you can call him to discuss as soon as possible.

## 2021-01-30 NOTE — Telephone Encounter (Signed)
Called, no answer.  LMVOM asking patient send any information he had to [place].  I looked in EMR and care everywhere, but I don't see recent ER/inpatient info.  I will await update from patient.

## 2021-01-31 NOTE — Telephone Encounter (Signed)
See below.  Please see what details you can get.  Thanks.

## 2021-02-02 NOTE — Telephone Encounter (Signed)
LMTCB

## 2021-02-06 ENCOUNTER — Telehealth (INDEPENDENT_AMBULATORY_CARE_PROVIDER_SITE_OTHER): Payer: Medicare Other | Admitting: Family Medicine

## 2021-02-06 ENCOUNTER — Other Ambulatory Visit: Payer: Self-pay

## 2021-02-06 ENCOUNTER — Encounter: Payer: Self-pay | Admitting: Family Medicine

## 2021-02-06 VITALS — Ht 70.0 in | Wt 240.0 lb

## 2021-02-06 DIAGNOSIS — R0789 Other chest pain: Secondary | ICD-10-CM

## 2021-02-06 MED ORDER — NITROGLYCERIN 0.4 MG SL SUBL: 0.4000 mg | SUBLINGUAL_TABLET | SUBLINGUAL | 3 refills | Status: AC | PRN

## 2021-02-06 NOTE — Telephone Encounter (Signed)
Patient had VV today with Dr. Damita Dunnings.

## 2021-02-06 NOTE — Progress Notes (Signed)
Virtual visit completed through WebEx or similar program Patient location: home  Provider location: [place] at [facility], office  Participants: Patient and me (unless stated otherwise below)  Pandemic considerations d/w pt.   Limitations and rationale for visit method d/w patient.  Patient agreed to proceed.   CC: inpatient f/u.   HPI:   Was at ER 01/27/21 in [place], in waiting room for 6 hours then patient left.  He had blood drawn x2 prior to leaving.  He was rec'd to have a stress test.  I don't yet have any hospital records.    [facility] hospital- I cannot access records via epic.  Discussed with patient.  He was at work, then had chest pain.  He initially thought it was heartburn.  No nausea or radiation.  Called his boss and left work after about 45 minutes.  Drove to ER but then pain resolved.  He left and went home.  Started working in the yard, pain returned, when back to ER.  Pain had stopped by arrival at ER.  He had initial labs and EKG done.  Then had return of pain at the ER, again resolved.  That was the third occurrence of pain that day.  No pain in the meantime.  No exertional sx in the meantime.  Was able two work in the yard in the meantime w/o troubles.  Not SOB.    He is going to try to get a copy of his labs from the hospital.    He is living in [facility] currently, Dr. Cecilio Asper is his brother in [place] cardiologist in the area.  We talked about setting up a referral in the meantime.  Routine NTG cautions d/w pt.    Meds and allergies reviewed.   ROS: Per HPI unless specifically indicated in ROS section   NAD Speech wnl  A/P: Atypical chest pain, no symptoms currently.  He is trying to get a copy of his records from the hospital.  Routine nitroglycerin cautions given to patient.  His brother-in-law sees Dr. Cecilio Asper locally and I went ahead and put in the referral preemptively.  Routine cautions given to patient in the meantime and he agrees with  plan.  He is already on amlodipine Farxiga metoprolol and telmisartan.  No change in those medications.  He was intolerant to Crestor previously with myalgias.

## 2021-02-08 NOTE — Assessment & Plan Note (Signed)
Atypical chest pain, no symptoms currently.  He is trying to get a copy of his records from the hospital.  Routine nitroglycerin cautions given to patient.  His brother-in-law sees Dr. Cecilio Asper locally and I went ahead and put in the referral preemptively.  Routine cautions given to patient in the meantime and he agrees with plan.  He is already on amlodipine Farxiga metoprolol and telmisartan.  No change in those medications.  He was intolerant to Crestor previously with myalgias.

## 2021-02-09 ENCOUNTER — Other Ambulatory Visit: Payer: Self-pay

## 2021-02-09 ENCOUNTER — Encounter: Payer: Self-pay | Admitting: Family Medicine

## 2021-02-09 ENCOUNTER — Ambulatory Visit (INDEPENDENT_AMBULATORY_CARE_PROVIDER_SITE_OTHER): Payer: Medicare Other | Admitting: Family Medicine

## 2021-02-09 VITALS — BP 138/72 | HR 73 | Temp 97.4°F | Ht 70.0 in | Wt 245.0 lb

## 2021-02-09 DIAGNOSIS — E119 Type 2 diabetes mellitus without complications: Secondary | ICD-10-CM | POA: Diagnosis not present

## 2021-02-09 DIAGNOSIS — E1122 Type 2 diabetes mellitus with diabetic chronic kidney disease: Secondary | ICD-10-CM

## 2021-02-09 DIAGNOSIS — R0789 Other chest pain: Secondary | ICD-10-CM | POA: Diagnosis not present

## 2021-02-09 DIAGNOSIS — N1832 Chronic kidney disease, stage 3b: Secondary | ICD-10-CM

## 2021-02-09 MED ORDER — BASAGLAR KWIKPEN 100 UNIT/ML ~~LOC~~ SOPN: PEN_INJECTOR | SUBCUTANEOUS | 3 refills | Status: DC

## 2021-02-09 MED ORDER — METOPROLOL SUCCINATE ER 100 MG PO TB24: ORAL_TABLET | ORAL | Status: DC

## 2021-02-09 NOTE — Patient Instructions (Addendum)
Plan on recheck in about 3 months.  We'll do a1c at the visit if needed.  Go to the lab on the way out.   If you have mychart we'll likely use that to update you.    Add 1 unit of insulin daily if needed.  See med list.   I'll await the notes from cardiology.  Take care.  Glad to see you.

## 2021-02-09 NOTE — Progress Notes (Signed)
This visit occurred during the SARS-CoV-2 public health emergency.  Safety protocols were in place, including screening questions prior to the visit, additional usage of staff PPE, and extensive cleaning of exam room while observing appropriate contact time as indicated for disinfecting solutions.  Diabetes:  Using medications without difficulties: yes Hypoglycemic episodes: no sx Hyperglycemic episodes: no sx Feet problems: no Blood Sugars averaging: not checked recently.   D/w pt about rechecking sugar in the near future.   eye exam within last year: due, d/w pt.   A1c recently 8.   Lipids pending.  H/o statin intolerance.   He'll get a flu shot this fall, d/w pt.    Recent inc in metoprolol per renal clinic.  I'll defer, he agrees. Cr recently rechecked per renal clinic,  recently 2.5--->2.3.  Previous K elevation normalized on recheck.    Recent troponin neg.  D/w pt about outside labs and ER eval.  He has been able to exert w/o sx in the meantime.  He has NTG to use but not used yet.  Routine cautions d/w pt.    PMH and SH reviewed  Meds, vitals, and allergies reviewed.   ROS: Per HPI unless specifically indicated in ROS section   GEN: nad, alert and oriented HEENT: ncat NECK: supple w/o LA CV: rrr. PULM: ctab, no inc wob ABD: soft, +bs EXT: no edema SKIN: no acute rash  Diabetic foot exam: Normal inspection No skin breakdown No calluses  Normal DP pulses Normal sensation to light touch and monofilament Nails normal  30 minutes were devoted to patient care in this encounter (this includes time spent reviewing the patient's file/history, interviewing and examining the patient, counseling/reviewing plan with patient).

## 2021-02-10 LAB — LIPID PANEL
Cholesterol: 228 mg/dL — ABNORMAL HIGH (ref <200)
HDL: 42 mg/dL (ref >=40)
LDL Cholesterol (Calc): 144 mg/dL (calc) — ABNORMAL HIGH
Non-HDL Cholesterol (Calc): 186 mg/dL (calc) — ABNORMAL HIGH (ref <130)
Total CHOL/HDL Ratio: 5.4 (calc) — ABNORMAL HIGH (ref <5.0)
Triglycerides: 297 mg/dL — ABNORMAL HIGH (ref <150)

## 2021-02-12 NOTE — Assessment & Plan Note (Signed)
He has not had recurrent chest pain in the meantime.  He has nitroglycerin and I gave him routine cautions on that.  We have referred him to cardiology.  Still okay for outpatient follow-up.  He is statin intolerant.  Depending on his cardiology work-up he may be a candidate for PSK 9 inhibitors.  I will await cardiology input.

## 2021-02-12 NOTE — Assessment & Plan Note (Signed)
A1c recently 8.   Lipids pending.  H/o statin intolerance.  See notes on labs. Discussed with patient about continue work on diet and exercise and adding 1 unit of insulin per day if his morning sugars above 140.  See after visit summary.  He agrees with plan.  He will get a flu shot this fall.

## 2021-02-12 NOTE — Assessment & Plan Note (Signed)
Recent inc in metoprolol per renal clinic.  I'll defer, he agrees. Cr recently rechecked per renal clinic,  recently 2.5--->2.3.  Previous K elevation normalized on recheck.

## 2021-02-13 ENCOUNTER — Telehealth: Payer: Self-pay | Admitting: Family Medicine

## 2021-02-13 NOTE — Telephone Encounter (Signed)
I sent a message to cardiology and I am checking on options in the meantime.

## 2021-02-13 NOTE — Telephone Encounter (Signed)
Patient stated that the cardiology office in [facility] can not see him for 3 or 4 months and wouldn't even be able to do the stress test until maybe January. Patient also decided to call his old cardiologist here in [place] and they can not see him until November since he would be a new patient with them again. Cardiology advised patient to call our office and see if Dr. Damita Dunnings can order the stress test to be done since they can not get him in quickly. Patient would really like to have this done as soon as possible.

## 2021-02-13 NOTE — Telephone Encounter (Signed)
Pt requesting call from [place]. Pt wouldn't say what it was for.

## 2021-02-15 ENCOUNTER — Telehealth: Payer: Self-pay | Admitting: Internal Medicine

## 2021-02-15 DIAGNOSIS — R079 Chest pain, unspecified: Secondary | ICD-10-CM

## 2021-02-15 DIAGNOSIS — R072 Precordial pain: Secondary | ICD-10-CM

## 2021-02-15 NOTE — Telephone Encounter (Signed)
I thank all involved. 

## 2021-02-15 NOTE — Addendum Note (Signed)
Addended by: Precious Gilding on: 02/15/2021 03:39 PM   Modules accepted: Orders

## 2021-02-15 NOTE — Telephone Encounter (Signed)
Called Patient with two identified confirmation to follow up from DOD call- CP.  Patient notes that he is feeling chest pain and pressure with activity.  Discomfort occurs with either at his job ([employer]) or with push mowing his lawn. Improved with rest.   Patient has episode where he went to push mow the lawn and had anginal CP.  Went to the ED and it stopped.  Waited in the parking lot, returned home, returned to mowing the law and the CP returned.  Had ED visit in [facility] (he has moved but has a house in [place]) with negative blood work, normal EKG.  Was recommended to have stress test.  Earliest appointment in [facility] is in January.  Has not seen our group in greater than 3 years.  He has notable risk factors in aortic atherosclerosis, HTN, DM, and CKD IIIb.  Stress testing is reasonable.  Has exercise capacity. - we discussed red flag chest pain sx - we discussed exercise NM Stress testing; patient is willing to pursue this - if negative, we will have him follow up in [facility] - if concerning for ischemia, will bring in for new patient eval  Patient had no further questions.  Rudean Haskell, MD [place], [address] [place], [place] [address] [phone]  3:24 PM

## 2021-02-15 NOTE — Telephone Encounter (Addendum)
Called pt and reviewed instructions for NMST.   Instructions placed on my chart for pt to view. Pt verbalizes understanding all questions answered.

## 2021-02-15 NOTE — Telephone Encounter (Signed)
I talked with Dr. Gasper Sells about this patient and his situation.  I greatly appreciate input from Dr. Gasper Sells and cardiology staff.  Cardiology staff will contact patient about scheduling a stress test.

## 2021-02-16 ENCOUNTER — Telehealth (HOSPITAL_COMMUNITY): Payer: Self-pay | Admitting: *Deleted

## 2021-02-16 NOTE — Telephone Encounter (Signed)
Patient given detailed instructions per Myocardial Perfusion Study Information Sheet for the test on 02/20/21. Patient notified to arrive 15 minutes early and that it is imperative to arrive on time for appointment to keep from having the test rescheduled.  If you need to cancel or reschedule your appointment, please call the office within 24 hours of your appointment. . Patient verbalized understanding. Luis Joseph

## 2021-02-20 ENCOUNTER — Ambulatory Visit (HOSPITAL_COMMUNITY): Payer: Medicare Other | Attending: Internal Medicine

## 2021-02-20 ENCOUNTER — Other Ambulatory Visit: Payer: Self-pay

## 2021-02-20 DIAGNOSIS — R072 Precordial pain: Secondary | ICD-10-CM | POA: Insufficient documentation

## 2021-02-20 LAB — MYOCARDIAL PERFUSION IMAGING
Angina Index: 0
Base ST Depression (mm): 0 mm
Duke Treadmill Score: 5
Estimated workload: 7
Exercise duration (min): 5 min
LV dias vol: 37 mL (ref 62–150)
LV sys vol: 83 mL
MPHR: 153 {beats}/min
Nuc Stress EF: 56 %
Peak HR: 142 {beats}/min
Percent HR: 93 %
RPE: 19
Rest HR: 65 {beats}/min
Rest Nuclear Isotope Dose: 9.9 mCi
SDS: 3
SRS: 0
SSS: 3
ST Depression (mm): 0 mm
Stress Nuclear Isotope Dose: 30.9 mCi
TID: 0.94

## 2021-02-20 MED ORDER — TECHNETIUM TC 99M TETROFOSMIN IV KIT
9.9000 | PACK | Freq: Once | INTRAVENOUS | Status: AC | PRN
  Administered 2021-02-20: 9.9 via INTRAVENOUS
  Filled 2021-02-20: qty 10

## 2021-02-20 MED ORDER — TECHNETIUM TC 99M TETROFOSMIN IV KIT
30.9000 | PACK | Freq: Once | INTRAVENOUS | Status: AC | PRN
  Administered 2021-02-20: 30.9 via INTRAVENOUS
  Filled 2021-02-20: qty 31

## 2021-02-24 ENCOUNTER — Telehealth: Payer: Self-pay | Admitting: Family Medicine

## 2021-02-24 NOTE — Telephone Encounter (Signed)
Pt called in requesting a call back regarding test result [phone]

## 2021-02-24 NOTE — Telephone Encounter (Signed)
Spoke with patient and Dr. Damita Dunnings about patients stress test results. Patient verbalized understanding.

## 2021-03-29 ENCOUNTER — Ambulatory Visit
Admission: RE | Admit: 2021-03-29 | Discharge: 2021-03-29 | Disposition: A | Discharge status: Home / Self Care | Payer: Medicare Other | Source: Ambulatory Visit | Attending: Urology | Admitting: Urology

## 2021-03-29 ENCOUNTER — Other Ambulatory Visit: Payer: Self-pay

## 2021-03-29 DIAGNOSIS — N1339 Other hydronephrosis: Secondary | ICD-10-CM | POA: Diagnosis present

## 2021-03-31 ENCOUNTER — Ambulatory Visit: Payer: Self-pay | Admitting: Urology

## 2021-04-05 ENCOUNTER — Ambulatory Visit (INDEPENDENT_AMBULATORY_CARE_PROVIDER_SITE_OTHER): Payer: Medicare Other | Admitting: Urology

## 2021-04-05 ENCOUNTER — Other Ambulatory Visit: Payer: Self-pay

## 2021-04-05 ENCOUNTER — Ambulatory Visit (INDEPENDENT_AMBULATORY_CARE_PROVIDER_SITE_OTHER): Payer: Medicare Other | Admitting: Internal Medicine

## 2021-04-05 ENCOUNTER — Encounter: Payer: Self-pay | Admitting: Urology

## 2021-04-05 ENCOUNTER — Encounter: Payer: Self-pay | Admitting: Internal Medicine

## 2021-04-05 VITALS — BP 132/85 | HR 66 | Ht 70.0 in | Wt 252.0 lb

## 2021-04-05 VITALS — BP 124/82 | HR 62 | Ht 70.0 in | Wt 252.2 lb

## 2021-04-05 DIAGNOSIS — R600 Localized edema: Secondary | ICD-10-CM

## 2021-04-05 DIAGNOSIS — I700 Atherosclerosis of aorta: Secondary | ICD-10-CM | POA: Diagnosis not present

## 2021-04-05 DIAGNOSIS — R0609 Other forms of dyspnea: Secondary | ICD-10-CM

## 2021-04-05 DIAGNOSIS — N261 Atrophy of kidney (terminal): Secondary | ICD-10-CM | POA: Diagnosis not present

## 2021-04-05 DIAGNOSIS — N200 Calculus of kidney: Secondary | ICD-10-CM | POA: Diagnosis not present

## 2021-04-05 DIAGNOSIS — N1339 Other hydronephrosis: Secondary | ICD-10-CM

## 2021-04-05 MED ORDER — FUROSEMIDE 20 MG PO TABS: 20.0000 mg | ORAL_TABLET | Freq: Every day | ORAL | 3 refills | Status: DC

## 2021-04-05 MED ORDER — EZETIMIBE 10 MG PO TABS: 10.0000 mg | ORAL_TABLET | Freq: Every day | ORAL | 3 refills | Status: DC
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
## 2021-04-05 NOTE — Patient Instructions (Signed)
Medication Instructions:  Your physician has recommended you make the following change in your medication:  START:  Furosemide (Lasix) 20 mg by mouth daily START: Zetia 10 mg by mouth daily  *If you need a refill on your cardiac medications before your next appointment, please call your pharmacy*   Lab Work: ON 05-10-21: BNP, BMP, Mg If you have labs (blood work) drawn today and your tests are completely normal, you will receive your results only by: [place] (if you have MyChart) OR A paper copy in the mail If you have any lab test that is abnormal or we need to change your treatment, we will call you to review the results.   Testing/Procedures: Your physician has requested that you have an echocardiogram. Echocardiography is a painless test that uses sound waves to create images of your heart. It provides your doctor with information about the size and shape of your heart and how well your heart's chambers and valves are working. This procedure takes approximately one hour. There are no restrictions for this procedure.    Follow-Up: At [facility], you and your health needs are our priority.  As part of our continuing mission to provide you with exceptional heart care, we have created designated Provider Care Teams.  These Care Teams include your primary Cardiologist (physician) and Advanced Practice Providers (APPs -  Physician Assistants and Nurse Practitioners) who all work together to provide you with the care you need, when you need it.   Your next appointment:   3-4 month(s)  The format for your next appointment:   In Person  Provider:   Rudean Haskell, MD

## 2021-04-05 NOTE — Progress Notes (Signed)
04/05/2021 1:33 PM   Luis Joseph 1955/07/03 [id number]  Referring provider: Tonia Ghent, MD [address] [place],  [place] [address]  Chief Complaint  Patient presents with   Hydronephrosis    Urologic history:  1.  Congenital right UPJ obstruction Right pyeloplasty 1979 Chronic right hydronephrosis with atrophy left lower pole Diuretic renal scan 2015 with 35% function right/65% function last.  No significant washout was seen on the right.  Right retrograde pyelogram/right ureteroscopy remarkable for widely patent proximal ureter and UPJ.  Right lower pole calculi outside parenchyma CT 2017 chronic right hydro and conglomerate of calculi felt to be outside collecting system; scarring left lower pole No significant changes on serial CT/ultrasound   HPI: 65 y.o. male presents for annual follow-up.  Last seen by Dr. Junious Silk 03/2020 No problems since last years visit No bothersome LUTS, dysuria or gross hematuria Denies flank, abdominal or pelvic pain RUS 03/29/2021 with moderate hydronephrosis and cortical scarring lower pole right kidney.  No left hydronephrosis with stable scarring left lower pole Also had CT ordered by PCP 06/21/2020   PMH: Past Medical History:  Diagnosis Date   Allergic rhinitis    Anemia in chronic kidney disease    CKD (chronic kidney disease), stage III ([place])    NEPHROLOGIST-  DR Lavonia Dana   Diabetes mellitus, type 2 ([place])    diet controlled   GERD (gastroesophageal reflux disease) 05/21/1978   Gout 05/21/1985   Hematuria    History of kidney stones    Hydronephrosis, right    Hyperlipidemia 05/21/1988   Hyperparathyroidism, secondary renal ([place])    Hypertension 05/21/1978   Migraines    Better after gastric bypass   Renal cyst, left     Surgical History: Past Surgical History:  Procedure Laterality Date   COLONOSCOPY WITH PROPOFOL N/A 03/02/2020   Procedure: COLONOSCOPY WITH PROPOFOL;  Surgeon: Virgel Manifold, MD;  Location: [facility];  Service: Endoscopy;  Laterality: N/A;  priority 3   CYSTOSCOPY W/ URETERAL STENT PLACEMENT Right 11/25/2012   Procedure: CYSTOSCOPY WITH RETROGRADE PYELOGRAM/URETERAL STENT PLACEMENT;  Surgeon: Hanley Ben, MD;  Location: [place];  Service: Urology;  Laterality: Right;   CYSTOSCOPY/RETROGRADE/URETEROSCOPY Right 02/10/2013   Procedure: CYSTOSCOPY/RETROGRADE/URETEROSCOPY Right stent removal  Procedure: Cysto, Right Retrograde, Right Stent Pull, Right Diagnostic Ureteroscopy;  Surgeon: Alexis Frock, MD;  Location: [facility];  Service: Urology;  Laterality: Right;   GASTRIC ROUX-EN-Y N/A 03/01/2014   Procedure: LAPAROSCOPIC ROUX-EN-Y GASTRIC BYPASS WITH UPPER ENDOSCOPY;  Surgeon: Kaylyn Lim, MD;  [facility];  Service: General;  Laterality: N/A;   LASIK     ORIF ANKLE FRACTURE Left 11/14/2016   Procedure: OPEN REDUCTION INTERNAL FIXATION (ORIF) LEFT TRIMALLEOLAR ANKLE FRACTURE;  Surgeon: Leandrew Koyanagi, MD;  Location: [place];  Service: Orthopedics;  Laterality: Left;   PARTIAL NEPHRECTOMY  12/19/1977   RIGHT PYELOPLASTY AND removal benign cyst and stones    Home Medications:  Allergies as of 04/05/2021       Reactions   Codeine Sulfate Hives, Swelling   Sildenafil Other (See Comments)   flushing   Crestor [rosuvastatin]    Myalgias.    Nsaids    CKD        Medication List        Accurate as of April 05, 2021  1:33 PM. If you have any questions, ask your nurse or doctor.          allopurinol  300 MG tablet Commonly known as: ZYLOPRIM TAKE 1 TABLET BY MOUTH EVERY DAY   amLODipine 2.5 MG tablet Commonly known as: NORVASC Take 1 tablet (2.5 mg total) by mouth daily.   Basaglar KwikPen 100 UNIT/ML Inject 20 Units into the skin daily. If AM sugar>140, then add 1 unit per day   clotrimazole-betamethasone cream Commonly known as: LOTRISONE APPLY TO AFFECTED AREA TWICE A DAY    dapagliflozin propanediol 10 MG Tabs tablet Commonly known as: FARXIGA Take 1 tablet (10 mg total) by mouth daily before breakfast.   ezetimibe 10 MG tablet Commonly known as: ZETIA Take 1 tablet (10 mg total) by mouth daily. Started by: Werner Lean, MD   FreeStyle Libre 14 Day Sensor Misc Use as directed to check sugar multiple times per day.  Diagnosis E 11.9 with hyperglycemia.  Treated with insulin.   FreeStyle [place] 2 Reader [place] Use as directed to check sugar multiple times per day.  Diagnosis E 11.9 with hyperglycemia.  Treated with insulin.   furosemide 20 MG tablet Commonly known as: LASIX Take 1 tablet (20 mg total) by mouth daily. Started by: Werner Lean, MD   metoprolol succinate 100 MG 24 hr tablet Commonly known as: TOPROL-XL TAKE 1 TABLET BY MOUTH TWICE A DAY WITH OR IMMEDIATELY FOLLOWING A MEAL   nitroGLYCERIN 0.4 MG SL tablet Commonly known as: NITROSTAT Place 1 tablet (0.4 mg total) under the tongue every 5 (five) minutes as needed for chest pain.   Pen Needles 30G X 5 MM Misc Use daily with insulin injection   telmisartan 80 MG tablet Commonly known as: MICARDIS Take 1 tablet (80 mg total) by mouth daily.        Allergies:  Allergies  Allergen Reactions   Codeine Sulfate Hives and Swelling   Sildenafil Other (See Comments)    flushing   Crestor [Rosuvastatin]     Myalgias.    Nsaids     CKD    Family History: Family History  Problem Relation Age of Onset   Heart disease Mother    Diabetes Mother    Hypertension Mother    Cancer Father        Lung   Diabetes Sister    Heart disease Maternal Grandfather    Diabetes Maternal Grandfather    Stroke Neg Hx    Prostate cancer Neg Hx    Colon cancer Neg Hx     Social History:  reports that he quit smoking about 43 years ago. His smoking use included cigarettes. He started smoking about 49 years ago. He has a 6.00 pack-year smoking history. He has never used  smokeless tobacco. He reports that he does not drink alcohol and does not use drugs.   Physical Exam: BP 132/85   Pulse 66   Ht 5\' 10"  (1.778 m)   Wt 252 lb (114.3 kg)   BMI 36.16 kg/m   Constitutional:  Alert and oriented, No acute distress. HEENT: [place] AT, moist mucus membranes.  Trachea midline, no masses. Cardiovascular: No clubbing, cyanosis, or edema. Respiratory: Normal respiratory effort, no increased work of breathing. Psychiatric: Normal mood and affect.   Pertinent Imaging: RUS/CT images personally reviewed and interpreted  US RENAL  Narrative CLINICAL DATA:  Hydronephrosis  EXAM: RENAL / URINARY TRACT ULTRASOUND COMPLETE  COMPARISON:  02/17/2020, CT 06/21/2020  FINDINGS: Right Kidney:  Renal measurements: 11.5 x 5.2 x 4.6 cm = volume: 145.8 mL. Moderate hydronephrosis of the right kidney without significant change. Cortical scarring  lower pole right kidney. Cortex slightly echogenic  Left Kidney:  Renal measurements: 11.3 x 5.9 x 5.5 cm = volume: 190.3 mL. Cortex slightly echogenic. No hydronephrosis. Cysts within the left kidney measuring to 28 mm at the upper pole. Scarring at the lower pole of left kidney  Bladder:  Appears normal for degree of bladder distention.  Other:  None.  IMPRESSION: 1. Similar appearance of moderate right hydronephrosis and cortical scarring at the lower pole of the right kidney. 2. Echogenic kidneys bilaterally. No hydronephrosis on the left. Scarring and atrophy lower pole left kidney.   Electronically Signed By: Donavan Foil M.D. On: 03/30/2021 22:52   Assessment & Plan:    1.  Right hydronephrosis Stable Continue annual follow-up with RUS/KUB 1 year  Abbie Sons, [place] [address], [place], [place] [address] [phone]

## 2021-04-06 ENCOUNTER — Encounter: Payer: Self-pay | Admitting: Urology

## 2021-04-14 ENCOUNTER — Other Ambulatory Visit: Payer: Self-pay | Admitting: Family Medicine

## 2021-04-17 ENCOUNTER — Other Ambulatory Visit: Payer: Self-pay | Admitting: Family Medicine

## 2021-04-20 ENCOUNTER — Telehealth: Payer: Self-pay

## 2021-04-20 MED ORDER — BASAGLAR KWIKPEN 100 UNIT/ML ~~LOC~~ SOPN
PEN_INJECTOR | SUBCUTANEOUS | 3 refills | Status: DC
Start: 2021-04-20 — End: 2021-06-21

## 2021-04-20 NOTE — Telephone Encounter (Signed)
[name] - Client Nonclinical Telephone Record  [facility] Client [facility] Night - Client Client Site [facility] [place] - Night Provider Renford Dills - MD Contact Type Call Who Is Calling Patient / Member / Family / Caregiver Caller Name [place] Phone Number [phone] Patient Name Luis Joseph Patient DOB [birth year] Call Type Message Only Information Provided Reason for Call Medication Question / Request Initial Comment Caller just called pharmacy to get insulin refill and after tonight he will be out. Additional Comment Please call asap. Disp. Time Disposition Final User 04/19/2021 6:49:49 PM General Information Provided Yes [place], [place] Call Closed By: Madalyn Rob Transaction Date/Time: 04/19/2021 6:47:59 PM (ET

## 2021-04-20 NOTE — Telephone Encounter (Signed)
Rx sent and patient is aware. 

## 2021-04-21 ENCOUNTER — Other Ambulatory Visit: Payer: Self-pay | Admitting: Family Medicine

## 2021-04-30 ENCOUNTER — Other Ambulatory Visit: Payer: Self-pay | Admitting: Family Medicine

## 2021-05-09 ENCOUNTER — Other Ambulatory Visit: Payer: Medicare Other

## 2021-05-10 ENCOUNTER — Other Ambulatory Visit: Payer: Medicare Other

## 2021-05-10 ENCOUNTER — Other Ambulatory Visit (HOSPITAL_COMMUNITY): Payer: Medicare Other

## 2021-05-17 ENCOUNTER — Other Ambulatory Visit: Payer: Self-pay

## 2021-05-17 ENCOUNTER — Other Ambulatory Visit: Payer: Medicare Other | Admitting: *Deleted

## 2021-05-17 ENCOUNTER — Ambulatory Visit (HOSPITAL_COMMUNITY): Payer: Medicare Other | Attending: Cardiology

## 2021-05-17 DIAGNOSIS — R600 Localized edema: Secondary | ICD-10-CM

## 2021-05-17 DIAGNOSIS — R0609 Other forms of dyspnea: Secondary | ICD-10-CM | POA: Insufficient documentation

## 2021-05-17 DIAGNOSIS — I700 Atherosclerosis of aorta: Secondary | ICD-10-CM

## 2021-05-17 LAB — ECHOCARDIOGRAM COMPLETE
Area-P 1/2: 2.8 cm2
P 1/2 time: 863 msec
S' Lateral: 2 cm

## 2021-05-18 LAB — BASIC METABOLIC PANEL
BUN/Creatinine Ratio: 22 (ref 10–24)
BUN: 57 mg/dL — ABNORMAL HIGH (ref 8–27)
CO2: 18 mmol/L — ABNORMAL LOW (ref 20–29)
Calcium: 9.2 mg/dL (ref 8.6–10.2)
Chloride: 107 mmol/L — ABNORMAL HIGH (ref 96–106)
Creatinine, Ser: 2.61 mg/dL — ABNORMAL HIGH (ref 0.76–1.27)
Glucose: 197 mg/dL — ABNORMAL HIGH (ref 70–99)
Potassium: 5 mmol/L (ref 3.5–5.2)
Sodium: 140 mmol/L (ref 134–144)
eGFR: 26 mL/min/{1.73_m2} — ABNORMAL LOW (ref >59)

## 2021-05-18 LAB — PRO B NATRIURETIC PEPTIDE: NT-Pro BNP: 75 pg/mL (ref 0–376)

## 2021-05-18 LAB — MAGNESIUM: Magnesium: 2.2 mg/dL (ref 1.6–2.3)

## 2021-06-01 ENCOUNTER — Ambulatory Visit: Payer: Medicare Other | Admitting: Family Medicine

## 2021-06-06 ENCOUNTER — Ambulatory Visit: Payer: Medicare Other | Admitting: Family Medicine

## 2021-06-12 ENCOUNTER — Ambulatory Visit: Payer: Medicare Other | Admitting: Family Medicine

## 2021-06-21 ENCOUNTER — Other Ambulatory Visit: Payer: Self-pay

## 2021-06-21 ENCOUNTER — Other Ambulatory Visit: Payer: Self-pay | Admitting: Family Medicine

## 2021-06-21 MED ORDER — BASAGLAR KWIKPEN 100 UNIT/ML ~~LOC~~ SOPN: PEN_INJECTOR | SUBCUTANEOUS | 3 refills | Status: DC

## 2021-06-21 MED ORDER — INSULIN DETEMIR 100 UNIT/ML FLEXPEN: SUBCUTANEOUS | 12 refills | Status: DC

## 2021-06-21 NOTE — Telephone Encounter (Signed)
I printed the rx.  Please fax in after I sign it and notify pt.  I couldn't get it to go through electronically.  Thanks.

## 2021-06-21 NOTE — Telephone Encounter (Signed)
Insurance will no longer cover basaglar and needs changed to levemir flexpen. Okay with change?

## 2021-06-22 ENCOUNTER — Other Ambulatory Visit: Payer: Self-pay

## 2021-06-22 MED ORDER — INSULIN DETEMIR 100 UNIT/ML FLEXPEN: SUBCUTANEOUS | 12 refills | Status: DC

## 2021-06-22 NOTE — Telephone Encounter (Signed)
I tried several times to fax rx manually and would not go; even tried to [facility] again today and still did not go thru. I called pharmacy and verbally gave rx information.

## 2021-07-13 ENCOUNTER — Other Ambulatory Visit: Payer: Self-pay

## 2021-07-13 ENCOUNTER — Ambulatory Visit (INDEPENDENT_AMBULATORY_CARE_PROVIDER_SITE_OTHER): Payer: Medicare Other | Admitting: Family Medicine

## 2021-07-13 ENCOUNTER — Encounter: Payer: Self-pay | Admitting: Family Medicine

## 2021-07-13 VITALS — BP 126/70 | HR 83 | Temp 97.6°F | Ht 70.0 in | Wt 251.0 lb

## 2021-07-13 DIAGNOSIS — N1832 Chronic kidney disease, stage 3b: Secondary | ICD-10-CM

## 2021-07-13 DIAGNOSIS — E1122 Type 2 diabetes mellitus with diabetic chronic kidney disease: Secondary | ICD-10-CM | POA: Diagnosis not present

## 2021-07-13 LAB — URINALYSIS, ROUTINE W REFLEX MICROSCOPIC
Bilirubin Urine: NEGATIVE
Hgb urine dipstick: NEGATIVE
Ketones, ur: NEGATIVE
Leukocytes,Ua: NEGATIVE
Nitrite: NEGATIVE
RBC / HPF: NONE SEEN (ref 0–?)
Specific Gravity, Urine: 1.015 (ref 1.000–1.030)
Total Protein, Urine: 100 — AB
Urine Glucose: >=1000 — AB
Urobilinogen, UA: 0.2 (ref 0.0–1.0)
pH: 5.5 (ref 5.0–8.0)

## 2021-07-13 LAB — CBC WITH DIFFERENTIAL/PLATELET
Basophils Absolute: 0.1 10*3/uL (ref 0.0–0.1)
Basophils Relative: 0.8 % (ref 0.0–3.0)
Eosinophils Absolute: 0.1 10*3/uL (ref 0.0–0.7)
Eosinophils Relative: 1.7 % (ref 0.0–5.0)
HCT: 41.8 % (ref 39.0–52.0)
Hemoglobin: 13.8 g/dL (ref 13.0–17.0)
Lymphocytes Relative: 27.3 % (ref 12.0–46.0)
Lymphs Abs: 2.2 10*3/uL (ref 0.7–4.0)
MCHC: 33 g/dL (ref 30.0–36.0)
MCV: 95.5 fl (ref 78.0–100.0)
Monocytes Absolute: 0.8 10*3/uL (ref 0.1–1.0)
Monocytes Relative: 9.3 % (ref 3.0–12.0)
Neutro Abs: 4.9 10*3/uL (ref 1.4–7.7)
Neutrophils Relative %: 60.9 % (ref 43.0–77.0)
Platelets: 250 10*3/uL (ref 150.0–400.0)
RBC: 4.38 Mil/uL (ref 4.22–5.81)
RDW: 13.6 % (ref 11.5–15.5)
WBC: 8.1 10*3/uL (ref 4.0–10.5)

## 2021-07-13 LAB — COMPREHENSIVE METABOLIC PANEL
ALT: 18 U/L (ref 0–53)
AST: 16 U/L (ref 0–37)
Albumin: 3.9 g/dL (ref 3.5–5.2)
Alkaline Phosphatase: 119 U/L — ABNORMAL HIGH (ref 39–117)
BUN: 62 mg/dL — ABNORMAL HIGH (ref 6–23)
CO2: 24 mEq/L (ref 19–32)
Calcium: 9.2 mg/dL (ref 8.4–10.5)
Chloride: 105 mEq/L (ref 96–112)
Creatinine, Ser: 2.7 mg/dL — ABNORMAL HIGH (ref 0.40–1.50)
GFR: 23.65 mL/min — ABNORMAL LOW (ref >60.00)
Glucose, Bld: 161 mg/dL — ABNORMAL HIGH (ref 70–99)
Potassium: 4.9 mEq/L (ref 3.5–5.1)
Sodium: 136 mEq/L (ref 135–145)
Total Bilirubin: 0.4 mg/dL (ref 0.2–1.2)
Total Protein: 7.4 g/dL (ref 6.0–8.3)

## 2021-07-13 LAB — MICROALBUMIN / CREATININE URINE RATIO
Creatinine,U: 48 mg/dL
Microalb Creat Ratio: 121.5 mg/g — ABNORMAL HIGH (ref 0.0–30.0)
Microalb, Ur: 58.3 mg/dL — ABNORMAL HIGH (ref 0.0–1.9)

## 2021-07-13 LAB — POCT GLYCOSYLATED HEMOGLOBIN (HGB A1C): Hemoglobin A1C: 8.8 % — AB (ref 4.0–5.6)

## 2021-07-13 MED ORDER — INSULIN DETEMIR 100 UNIT/ML FLEXPEN: SUBCUTANEOUS | 12 refills | Status: DC

## 2021-07-13 NOTE — Patient Instructions (Addendum)
Go to the lab on the way out.   If you have mychart we'll likely use that to update you.    Take care.  Glad to see you. Let me check on ozempic options.  We'll send a copy of your labs to the kidney clinic.  Plan on recheck in about 3 months. Labs at the visit.  You don't have to fast.

## 2021-07-13 NOTE — Progress Notes (Signed)
This visit occurred during the SARS-CoV-2 public health emergency.  Safety protocols were in place, including screening questions prior to the visit, additional usage of staff PPE, and extensive cleaning of exam room while observing appropriate contact time as indicated for disinfecting solutions.  Diabetes:  Using medications without difficulties: yes Hypoglycemic episodes:no Hyperglycemic episodes:no Feet problems: occ tingling L 1st toe.   Blood Sugars averaging:  see below.   eye exam within last year: due,d/w pt.  He is checking on getting an appointment.   A1c 8.8.   Taking 27 units insulin daily.  Usually with AM sugar ~140, occ lower.  Up to 300 later in the day after meals.    He routinely follows up with the renal clinic and we will collect his labs and send those to Dr. Juleen China as Juluis Rainier.  Levemir rx written and given to patient.    D/w pt about ozempic and routine cautions d/w pt. I told him I would check with pharmacy staff here about potentially getting this set up.  He fell changing a tire 2 weeks ago.  Sacral pain.  Slowly getting better.  D/w pt about giving it more time, as I would expect him to continue improving.  PMH and SH reviewed  Meds, vitals, and allergies reviewed.   ROS: Per HPI unless specifically indicated in ROS section   GEN: nad, alert and oriented HEENT: ncat NECK: supple w/o LA CV: rrr. PULM: ctab, no inc wob ABD: soft, +bs EXT: no edema SKIN: Well-perfused.

## 2021-07-14 LAB — PTH, INTACT AND CALCIUM
Calcium: 9.5 mg/dL (ref 8.6–10.3)
PTH: 127 pg/mL — ABNORMAL HIGH (ref 16–77)

## 2021-07-19 ENCOUNTER — Telehealth: Payer: Self-pay | Admitting: Family Medicine

## 2021-07-19 DIAGNOSIS — E1122 Type 2 diabetes mellitus with diabetic chronic kidney disease: Secondary | ICD-10-CM

## 2021-07-19 NOTE — Telephone Encounter (Signed)
Can you please help me with getting this patient set up for Ozempic?  I would greatly appreciate your help.  Thanks. ?

## 2021-07-19 NOTE — Assessment & Plan Note (Signed)
We will get his routine renal labs collected.  See notes on labs.  I will check with pharmacy staff about Ozempic in the meantime.  Levemir prescription was written and given to patient.  See after visit summary. ?

## 2021-07-21 MED ORDER — OZEMPIC (0.25 OR 0.5 MG/DOSE) 2 MG/1.5ML ~~LOC~~ SOPN: PEN_INJECTOR | SUBCUTANEOUS | 0 refills | Status: DC

## 2021-07-21 NOTE — Telephone Encounter (Addendum)
Spoke with patient. He is still using [employer] insurance that is technically a commercial (non-Medicare Part D) plan, and Ozempic is on his formulary. He reports he currently pays [facility] so it is likely Ozempic is similar. [facility], and advised pt to call me back if price is too high, we can get him a copay card if needed. ? ?Also discussed Ozempic administration - advised to start with 0.25 mg weekly for 4 weeks, then as long as he tolerates it, increase to 0.5 mg weekly. He reports fasting BG 130-140, but post-prandial up to 300s. Advised he keep the same Insulin dose for now as the starting dose of Ozempic does not have a significant glycemic effect, but we may need to reduce insulin once he increases Ozempic dose after 4 weeks. ? ?Scheduled follow up call w/ PharmD in 4 weeks to assess tolerability, BG adjust Ozempic/insulin dose if needed. ?

## 2021-07-21 NOTE — Addendum Note (Signed)
Addended by: Charlton Haws on: 07/21/2021 02:59 PM ? ? Modules accepted: Orders ? ?

## 2021-07-23 NOTE — Telephone Encounter (Signed)
Many thanks. 

## 2021-08-08 LAB — HM DIABETES EYE EXAM
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
## 2021-08-09 ENCOUNTER — Other Ambulatory Visit: Payer: Self-pay

## 2021-08-09 ENCOUNTER — Encounter: Payer: Self-pay | Admitting: Internal Medicine

## 2021-08-09 ENCOUNTER — Ambulatory Visit (INDEPENDENT_AMBULATORY_CARE_PROVIDER_SITE_OTHER): Payer: Medicare Other | Admitting: Internal Medicine

## 2021-08-09 VITALS — BP 128/82 | HR 78 | Ht 70.0 in | Wt 251.2 lb

## 2021-08-09 DIAGNOSIS — M791 Myalgia, unspecified site: Secondary | ICD-10-CM | POA: Diagnosis not present

## 2021-08-09 DIAGNOSIS — I700 Atherosclerosis of aorta: Secondary | ICD-10-CM | POA: Insufficient documentation

## 2021-08-09 DIAGNOSIS — N1832 Chronic kidney disease, stage 3b: Secondary | ICD-10-CM | POA: Diagnosis not present

## 2021-08-09 DIAGNOSIS — E782 Mixed hyperlipidemia: Secondary | ICD-10-CM | POA: Diagnosis not present

## 2021-08-09 DIAGNOSIS — T466X5A Adverse effect of antihyperlipidemic and antiarteriosclerotic drugs, initial encounter: Secondary | ICD-10-CM

## 2021-08-09 NOTE — Patient Instructions (Signed)
Medication Instructions:  ?Your physician recommends that you continue on your current medications as directed. Please refer to the Current Medication list given to you today. ? ?*If you need a refill on your cardiac medications before your next appointment, please call your pharmacy* ? ? ?Lab Work: ?FASTING LIPID PANEL and ALT at [facility] ?If you have labs (blood work) drawn today and your tests are completely normal, you will receive your results only by: ?MyChart Message (if you have MyChart) OR ?A paper copy in the mail ?If you have any lab test that is abnormal or we need to change your treatment, we will call you to review the results. ? ? ?Testing/Procedures: ?NONE ? ? ?Follow-Up: ?At [facility], you and your health needs are our priority.  As part of our continuing mission to provide you with exceptional heart care, we have created designated Provider Care Teams.  These Care Teams include your primary Cardiologist (physician) and Advanced Practice Providers (APPs -  Physician Assistants and Nurse Practitioners) who all work together to provide you with the care you need, when you need it. ? ?Your next appointment:   ?1 year(s) ? ?The format for your next appointment:   ?In Person ? ?Provider:   ?Werner Lean, MD   ? ? ?

## 2021-08-22 ENCOUNTER — Telehealth: Payer: Medicare Other

## 2021-08-22 NOTE — Progress Notes (Incomplete)
?  Care Management  ? ?Follow Up Note ? ? ?08/22/2021 ?Name: Luis Joseph MRN: [id number] DOB: 18-Oct-1955 ? ?Referred by: Tonia Ghent, MD ?Reason for referral : No chief complaint on file. ? ? ?Luis Joseph is a 66 y.o. year old male who is a primary care patient of Tonia Ghent, MD. The team was consulted for assistance with chronic disease management and care coordination needs.   ? ?Review of patient status, including review of consultants reports, relevant laboratory and other test results, and collaboration with appropriate care team members and the patient's provider was performed as part of comprehensive patient evaluation and provision of chronic care management services.   ? ?SDOH (Social Determinants of Health) assessments performed: No ?See Care Plan activities for detailed interventions related to [facility])  ?  ? ?Outpatient Encounter Medications as of 08/22/2021  ?Medication Sig  ? allopurinol (ZYLOPRIM) 300 MG tablet TAKE 1 TABLET BY MOUTH EVERY DAY  ? amLODipine (NORVASC) 2.5 MG tablet Take 1 tablet (2.5 mg total) by mouth daily.  ? clotrimazole-betamethasone (LOTRISONE) cream APPLY TO AFFECTED AREA TWICE A DAY  ? Continuous Blood Gluc Receiver (FREESTYLE LIBRE 2 READER) DEVI Use as directed to check sugar multiple times per day.  Diagnosis E 11.9 with hyperglycemia.  Treated with insulin.  ? Continuous Blood Gluc Sensor (FREESTYLE LIBRE 14 DAY SENSOR) MISC Use as directed to check sugar multiple times per day.  Diagnosis E 11.9 with hyperglycemia.  Treated with insulin.  ? dapagliflozin propanediol (FARXIGA) 10 MG TABS tablet Take 1 tablet (10 mg total) by mouth daily before breakfast.  ? ezetimibe (ZETIA) 10 MG tablet Take 1 tablet (10 mg total) by mouth daily.  ? furosemide (LASIX) 20 MG tablet Take 1 tablet (20 mg total) by mouth daily.  ? insulin detemir (LEVEMIR) 100 unit/ml SOLN Inject 27 Units into the skin daily. If AM sugar>140, then add 1 unit per day  ? Insulin Pen Needle (PEN NEEDLES)  30G X 5 MM MISC Use daily with insulin injection  ? metoprolol succinate (TOPROL-XL) 100 MG 24 hr tablet TAKE 1 TABLET BY MOUTH EVERY DAY WITH OR IMMEDIATELY FOLLOWING A MEAL  ? nitroGLYCERIN (NITROSTAT) 0.4 MG SL tablet Place 1 tablet (0.4 mg total) under the tongue every 5 (five) minutes as needed for chest pain.  ? Semaglutide,0.25 or 0.5MG /DOS, (OZEMPIC, 0.25 OR 0.5 MG/DOSE,) 2 MG/1.5ML SOPN Inject 0.25 mg into the skin once a week for 4 weeks, then increase to 0.5 mg weekly  ? telmisartan (MICARDIS) 80 MG tablet TAKE 1 TABLET BY MOUTH 1 TIME EACH DAY.  ? ?No facility-administered encounter medications on file as of 08/22/2021.  ?  ? ?Objective:  ? ? Goals Addressed   ?None ?  ?  ?There are no care plans to display for this patient. ? ? ?Plan: ?{CCM FOLLOW UP PLAN:22241} ? ? ?Charlene Brooke, PharmD, BCACP ?Clinical Pharmacist ?[facility] ?[phone] ? ? ?Diabetes (A1c goal <7%) ?-Not ideally controlled - A1c 8.8% (06/2021) ?-Current home glucose readings ?fasting glucose: *** ?post prandial glucose: *** ?-Current medications: ?Farxiga 10 mg daily ?Levemir 27 units daily ?Ozempic 0.25 mg weekly ?-Medications previously tried: glipizide, metformin ?-{ACTIONS;DENIES/REPORTS:21021675::"Denies"} hypoglycemic/hyperglycemic symptoms ?-Current meal patterns:  ?breakfast: ***  ?lunch: ***  ?dinner: *** ?snacks: *** ?drinks: *** ?-Current exercise: *** ?-Educated on {CCM DM COUNSELING:25123} ?-Counseled to check feet daily and get yearly eye exams ?-{CCMPHARMDINTERVENTION:25122} ? ? ?

## 2021-08-23 ENCOUNTER — Other Ambulatory Visit: Payer: Self-pay | Admitting: Family Medicine

## 2021-08-23 ENCOUNTER — Telehealth: Payer: Self-pay | Admitting: Pharmacist

## 2021-08-23 DIAGNOSIS — E1122 Type 2 diabetes mellitus with diabetic chronic kidney disease: Secondary | ICD-10-CM

## 2021-08-23 DIAGNOSIS — E1165 Type 2 diabetes mellitus with hyperglycemia: Secondary | ICD-10-CM

## 2021-08-23 MED ORDER — FREESTYLE LIBRE 2 SENSOR MISC: 5 refills | Status: DC

## 2021-08-23 MED ORDER — OZEMPIC (0.25 OR 0.5 MG/DOSE) 2 MG/1.5ML ~~LOC~~ SOPN: 0.5000 mg | PEN_INJECTOR | SUBCUTANEOUS | 3 refills | Status: DC

## 2021-08-23 NOTE — Telephone Encounter (Signed)
Contacted patient to discuss Ozempic titration. ? ?Pt picked up Ozempic last month, he reports copay was ~$200. He will inject his 4th dose this Friday. He denies side effects or s/sx of hypoglycemia. He did reduce his Levemir from 27 to 24 units when he started Ozempic (~12% decrease). He has not been checking sugars as he has not refilled [employer]. He just [facility] Freestyle Libre [facility] - of note he has been using original 14-day sensors (without alarms). ? ?Discussed benefits of upgrading to [facility] 2 for high/low sugar alarms. Pt agreed to upgrade. ? ?Ozempic copay card obtained: ?BIN [id number] ?PCN [id number] ?GRP [id number] ?ID [id number] ? ?Plan: ?-Increase Ozempic to 0.5 mg next week ?-Start Freestyle Libre 2  ?-Keep Levemir at 24 units for now ?-F/U 3 weeks for updated CBG readings ?

## 2021-08-23 NOTE — Telephone Encounter (Signed)
Agree. Thanks

## 2021-08-24 ENCOUNTER — Other Ambulatory Visit: Payer: Self-pay | Admitting: Family Medicine

## 2021-09-13 ENCOUNTER — Telehealth: Payer: Self-pay

## 2021-09-13 NOTE — Chronic Care Management (AMB) (Signed)
? ? ?  Chronic Care Management ?Pharmacy Assistant  ? ?Name: MOHAMUD MROZEK  MRN: [id number] DOB: 1955-07-16 ? ? ?Reason for Encounter: Reminder Call ?  ? ? ?Medications: ?Outpatient Encounter Medications as of 09/13/2021  ?Medication Sig  ? allopurinol (ZYLOPRIM) 300 MG tablet TAKE 1 TABLET BY MOUTH EVERY DAY  ? amLODipine (NORVASC) 2.5 MG tablet Take 1 tablet (2.5 mg total) by mouth daily.  ? clotrimazole-betamethasone (LOTRISONE) cream APPLY TO AFFECTED AREA TWICE A DAY  ? Continuous Blood Gluc Receiver (FREESTYLE LIBRE 2 READER) DEVI Use as directed to check sugar multiple times per day.  Diagnosis E 11.9 with hyperglycemia.  Treated with insulin.  ? Continuous Blood Gluc Sensor (FREESTYLE LIBRE 2 SENSOR) MISC Apply sensor every 14 days to monitor sugar continously  ? dapagliflozin propanediol (FARXIGA) 10 MG TABS tablet Take 1 tablet (10 mg total) by mouth daily before breakfast.  ? ezetimibe (ZETIA) 10 MG tablet Take 1 tablet (10 mg total) by mouth daily.  ? furosemide (LASIX) 20 MG tablet Take 1 tablet (20 mg total) by mouth daily.  ? insulin detemir (LEVEMIR) 100 unit/ml SOLN Inject 27 Units into the skin daily. If AM sugar>140, then add 1 unit per day  ? Insulin Pen Needle (PEN NEEDLES) 30G X 5 MM MISC Use daily with insulin injection  ? metoprolol succinate (TOPROL-XL) 100 MG 24 hr tablet TAKE 1 TABLET BY MOUTH EVERY DAY WITH OR IMMEDIATELY FOLLOWING A MEAL  ? nitroGLYCERIN (NITROSTAT) 0.4 MG SL tablet Place 1 tablet (0.4 mg total) under the tongue every 5 (five) minutes as needed for chest pain.  ? Semaglutide,0.25 or 0.5MG /DOS, (OZEMPIC, 0.25 OR 0.5 MG/DOSE,) 2 MG/1.5ML SOPN Inject 0.5 mg into the skin once a week. Replaces previous Rx.  ? telmisartan (MICARDIS) 80 MG tablet TAKE 1 TABLET BY MOUTH 1 TIME EACH DAY.  ? ?No facility-administered encounter medications on file as of 09/13/2021.  ? ?ABDULHADI STOPA was contacted to remind of upcoming telephone visit with Charlene Brooke on 09/18/21 at 11:45am.  Patient was reminded to have any blood glucose and blood pressure readings available for review at appointment.  ? ?Message was left reminding patient of appointment. ? ? ?Star Rating Drugs: ?Medication:  Last Fill: Day Supply ?Levemir flex   08/24/21  90 ?Basaglar 100  06/20/21 70 ?Ozempic 57ml  08/24/21  28 ?Telmisartan 80mg  07/13/21 90 ?Charlene Brooke, CPP notified ? ?Hooper [place]etteway, CCMA ?Health concierge  ?[phone]  ?

## 2021-09-14 ENCOUNTER — Telehealth: Payer: Self-pay | Admitting: Internal Medicine

## 2021-09-14 NOTE — Telephone Encounter (Signed)
Left a message to call back.

## 2021-09-14 NOTE — Telephone Encounter (Signed)
Patient calling in to have lab orders fax's to [facility] at [phone]. Please advise ?

## 2021-09-15 ENCOUNTER — Other Ambulatory Visit: Payer: Self-pay

## 2021-09-15 DIAGNOSIS — E782 Mixed hyperlipidemia: Secondary | ICD-10-CM

## 2021-09-15 DIAGNOSIS — I700 Atherosclerosis of aorta: Secondary | ICD-10-CM

## 2021-09-15 NOTE — Telephone Encounter (Signed)
Called pt informed that lab orders are in the system and can have labs drawn at convenience. Pt had no further concerns.  ?

## 2021-09-15 NOTE — Progress Notes (Signed)
Replaced orders for FLP and ALT so pt can have labs drawn in [facility]. ?

## 2021-09-15 NOTE — Telephone Encounter (Signed)
Re-entered orders for FLP, ALT to be drawn in [facility].  ?

## 2021-09-18 ENCOUNTER — Ambulatory Visit (INDEPENDENT_AMBULATORY_CARE_PROVIDER_SITE_OTHER): Payer: Medicare Other | Admitting: Pharmacist

## 2021-09-18 ENCOUNTER — Telehealth: Payer: Self-pay | Admitting: Pharmacist

## 2021-09-18 DIAGNOSIS — I700 Atherosclerosis of aorta: Secondary | ICD-10-CM

## 2021-09-18 DIAGNOSIS — N1832 Chronic kidney disease, stage 3b: Secondary | ICD-10-CM

## 2021-09-18 DIAGNOSIS — E1165 Type 2 diabetes mellitus with hyperglycemia: Secondary | ICD-10-CM

## 2021-09-18 DIAGNOSIS — I10 Essential (primary) hypertension: Secondary | ICD-10-CM

## 2021-09-18 DIAGNOSIS — E785 Hyperlipidemia, unspecified: Secondary | ICD-10-CM

## 2021-09-18 NOTE — Progress Notes (Signed)
?Chronic Care Management ?Pharmacy Note ? ?09/18/2021 ?Name:  Luis Joseph MRN:  [id number] DOB:  [birth year] ? ?Summary: CCM Initial visit ?-Pt is tolerating Ozempic 0.5 mg weekly well. Per [employer] (see A/P for graph), Avg sugar 176 (GMI 7.5%), post-prandial sugars remain elevated (> 180) 39% of the time. Fasting sugars close to goal 120-140, he has tapered Levemir down to 20-21 units ?-Pt reports Freestyle Elenor Legato is $74 per sensor at pharmacy; it may be covered by Medicare through DME ? ?Recommendations/Changes made from today's visit: ?-Increase Ozempic to 1 mg weekly (consulting with PCP) ?-Ordered Freestyle Libre 2 to [employer] via [place] ? ?Plan: ?-Pharmacist follow up televisit scheduled for 6 weeks ?-PCP f/u 10/26/21 ? ? ? ?Subjective: ?Luis Joseph is an 66 y.o. year old male who is a primary patient of Damita Dunnings, Elveria Rising, MD.  The CCM team was consulted for assistance with disease management and care coordination needs.   ? ?Engaged with patient by telephone for initial visit in response to provider referral for pharmacy case management and/or care coordination services.  ? ?Consent to Services:  ?The patient was given the following information about Chronic Care Management services today, agreed to services, and gave verbal consent: 1. CCM service includes personalized support from designated clinical staff supervised by the primary care provider, including individualized plan of care and coordination with other care providers 2. 24/7 contact phone numbers for assistance for urgent and routine care needs. 3. Service will only be billed when office clinical staff spend 20 minutes or more in a month to coordinate care. 4. Only one practitioner may furnish and bill the service in a calendar month. 5.The patient may stop CCM services at any time (effective at the end of the month) by phone call to the office staff. 6. The patient will be responsible for cost sharing (co-pay) of up to 20% of  the service fee (after annual deductible is met). Patient agreed to services and consent obtained. ? ?Patient Care Team: ?Tonia Ghent, MD as PCP - General ?Werner Lean, MD as PCP - Cardiology (Cardiology) ?Lavonia Dana, MD as Consulting Physician (Internal Medicine) ?Valeria Krisko, Cleaster [place], [facility] as Pharmacist (Pharmacist) ? ?Recent office visits: ?07/19/21 phone call - start Ozempic 0.25 mg weekly ?07/13/21 Dr Damita Dunnings OV: f/u DM. A1c 8.8%; Increase Levemir to 27 units.  ? ?Recent consult visits: ?08/09/21 Dr Gasper Sells (Cardiology): f/u Aortic atherosclerosis. Repeat lipid/LFT ordered, considering Nexlizet as next option. ? ?07/13/21 Dr Juleen China (Nephrology): f/u CKD stage 4. No med changes. ? ?Hospital visits: ?None in previous 6 months ? ? ?Objective: ? ?Lab Results  ?Component Value Date  ? CREATININE 2.70 (H) 07/13/2021  ? BUN 62 (H) 07/13/2021  ? GFR 23.65 (L) 07/13/2021  ? EGFR 26 (L) 05/17/2021  ? GFRNONAA 28 (L) 06/20/2020  ? GFRAA 32 (L) 06/20/2020  ? NA 136 07/13/2021  ? K 4.9 07/13/2021  ? CALCIUM 9.2 07/13/2021  ? CALCIUM 9.5 07/13/2021  ? CO2 24 07/13/2021  ? GLUCOSE 161 (H) 07/13/2021  ? ? ?Lab Results  ?Component Value Date/Time  ? HGBA1C 8.8 (A) 07/13/2021 12:21 PM  ? HGBA1C 8.0 01/27/2021 12:00 AM  ? HGBA1C 11.5 (A) 07/04/2020 10:38 AM  ? HGBA1C 8.5 (H) 05/12/2020 09:38 AM  ? HGBA1C 7.6 (H) 12/14/2019 07:38 AM  ? GFR 23.65 (L) 07/13/2021 01:07 PM  ? GFR 26.80 (L) 05/12/2020 09:38 AM  ? MICROALBUR 58.3 (H) 07/13/2021 01:07 PM  ? MICROALBUR 25.4 (H) 12/09/2014 10:33 AM  ?  ?  Last diabetic Eye exam:  ?Lab Results  ?Component Value Date/Time  ? HMDIABEYEEXA No Retinopathy 08/08/2021 12:00 AM  ?  ?Last diabetic Foot exam:  ?Lab Results  ?Component Value Date/Time  ? HMDIABFOOTEX yes 06/09/2010 12:00 AM  ?  ? ?Lab Results  ?Component Value Date  ? CHOL 228 (H) 02/09/2021  ? HDL 42 02/09/2021  ? LDLCALC 144 (H) 02/09/2021  ? LDLDIRECT 127.0 03/22/2016  ? TRIG 297 (H) 02/09/2021  ? CHOLHDL 5.4  (H) 02/09/2021  ? ? ? ?  Latest Ref Rng & Units 07/13/2021  ?  1:07 PM 01/27/2021  ? 12:00 AM 06/20/2020  ?  2:10 PM  ?Hepatic Function  ?Total Protein 6.0 - 8.3 g/dL 7.4    7.0    ?Albumin 3.5 - 5.2 g/dL 3.9    3.8    ?AST 0 - 37 U/L 16   33      11    ?ALT 0 - 53 U/L 18   28      16     ?Alk Phosphatase 39 - 117 U/L 119    129    ?Total Bilirubin 0.2 - 1.2 mg/dL 0.4    0.3    ?  ? This result is from an external source.  ? ? ?Lab Results  ?Component Value Date/Time  ? TSH 2.67 12/14/2019 05:27 PM  ? TSH 1.59 10/09/2016 09:20 AM  ? ? ? ?  Latest Ref Rng & Units 07/13/2021  ?  1:07 PM 01/27/2021  ? 12:00 AM 06/20/2020  ?  2:10 PM  ?CBC  ?WBC 4.0 - 10.5 K/uL 8.1    8.2    ?Hemoglobin 13.0 - 17.0 g/dL 13.8   14.7      13.7    ?Hematocrit 39.0 - 52.0 % 41.8    41.6    ?Platelets 150.0 - 400.0 K/uL 250.0   231      237    ?  ? This result is from an external source.  ? ? ?Lab Results  ?Component Value Date/Time  ? VD25OH 42.79 06/10/2014 09:48 AM  ? ? ?Clinical ASCVD: Yes  ?The 10-year ASCVD risk score (Arnett DK, et al., 2019) is: 35.1% ?  Values used to calculate the score: ?    Age: 59 years ?    Sex: Male ?    Is Non-Hispanic African American: No ?    Diabetic: Yes ?    Tobacco smoker: No ?    Systolic Blood Pressure: 741 mmHg ?    Is BP treated: Yes ?    HDL Cholesterol: 42 mg/dL ?    Total Cholesterol: 228 mg/dL   ? ? ?  07/13/2021  ? 12:21 PM 05/12/2020  ?  8:59 AM 11/11/2018  ?  3:18 PM  ?Depression screen PHQ 2/9  ?Decreased Interest 0 0 0  ?Down, Depressed, Hopeless 0 0 0  ?PHQ - 2 Score 0 0 0  ?  ? ?Social History  ? ?Tobacco Use  ?Smoking Status Former  ? Packs/day: 1.00  ? Years: 6.00  ? Pack years: 6.00  ? Types: Cigarettes  ? Start date: 70  ? Quit date: 11/17/1977  ? Years since quitting: 43.8  ?Smokeless Tobacco Never  ? ?BP Readings from Last 3 Encounters:  ?08/09/21 128/82  ?07/13/21 126/70  ?04/05/21 132/85  ? ?Pulse Readings from Last 3 Encounters:  ?08/09/21 78  ?07/13/21 83  ?04/05/21 66  ? ?Wt Readings  from Last 3 Encounters:  ?08/09/21  251 lb 3.2 oz (113.9 kg)  ?07/13/21 251 lb (113.9 kg)  ?04/05/21 252 lb (114.3 kg)  ? ?BMI Readings from Last 3 Encounters:  ?08/09/21 36.04 kg/m?  ?07/13/21 36.01 kg/m?  ?04/05/21 36.16 kg/m?  ? ? ?Assessment/Interventions: Review of patient past medical history, allergies, medications, health status, including review of consultants reports, laboratory and other test data, was performed as part of comprehensive evaluation and provision of chronic care management services.  ? ?SDOH:  (Social Determinants of Health) assessments and interventions performed: Yes ? ?SDOH Screenings  ? ?Alcohol Screen: Not on file  ?Depression (PHQ2-9): Low Risk   ? PHQ-2 Score: 0  ?Financial Resource Strain: Not on file  ?Food Insecurity: Not on file  ?Housing: Not on file  ?Physical Activity: Not on file  ?Social Connections: Not on file  ?Stress: Not on file  ?Tobacco Use: Medium Risk  ? Smoking Tobacco Use: Former  ? Smokeless Tobacco Use: Never  ? Passive Exposure: Not on file  ?Transportation Needs: Not on file  ? ? ?[place] ? ?Allergies  ?Allergen Reactions  ? Codeine Sulfate Hives and Swelling  ? Sildenafil Other (See Comments)  ?  flushing  ? Crestor [Rosuvastatin]   ?  Myalgias.   ? Nsaids   ?  CKD  ? ? ?Medications Reviewed Today   ? ? Reviewed by Jeremy Johann, CMA (Certified Medical Assistant) on 08/09/21 at [phone]  Med List Status: <None>  ? ?Medication Order Taking? Sig Documenting Provider Last Dose Status Informant  ?allopurinol (ZYLOPRIM) 300 MG tablet [id number] Yes TAKE 1 TABLET BY MOUTH EVERY DAY Tonia Ghent, MD Taking Active   ?amLODipine (NORVASC) 2.5 MG tablet [id number] Yes Take 1 tablet (2.5 mg total) by mouth daily. Tonia Ghent, MD Taking Active   ?clotrimazole-betamethasone (LOTRISONE) cream [id number] Yes APPLY TO AFFECTED AREA TWICE A DAY Tonia Ghent, MD Taking Active   ?Continuous Blood Gluc Receiver (FREESTYLE LIBRE 2 READER) DEVI [id number] Yes Use  as directed to check sugar multiple times per day.  Diagnosis E 11.9 with hyperglycemia.  Treated with insulin. Tonia Ghent, MD Taking Active   ?Continuous Blood Gluc Sensor (FREESTYLE LIBRE 14 DAY

## 2021-09-18 NOTE — Patient Instructions (Signed)
Visit Information ? ?Phone number for Pharmacist: [phone] ? ?Thank you for meeting with me to discuss your medications! I look forward to working with you to achieve your health care goals. Below is a summary of what we talked about during the visit: ? ? Goals Addressed   ?None ?  ? ? ?Care Plan : [place]  ?Updates made by Charlton Haws, RPH since 09/18/2021 12:00 AM  ?  ? ?Problem: Hypertension, Hyperlipidemia, Diabetes, and Chronic Kidney Disease   ?Priority: High  ?  ? ?Long-Range Goal: Disease mgmt   ?Start Date: 09/18/2021  ?Expected End Date: 09/19/2022  ?This Visit's Progress: On track  ?Priority: High  ?Note:   ?Current Barriers:  ?Unable to achieve control of diabetes, cholesterol  ? ?Pharmacist Clinical Goal(s):  ?Patient will adhere to plan to optimize therapeutic regimen for DM, HLD as evidenced by report of adherence to recommended medication management changes through collaboration with PharmD and provider.  ? ?Interventions: ?1:1 collaboration with Tonia Ghent, MD regarding development and update of comprehensive plan of care as evidenced by provider attestation and co-signature ?Inter-disciplinary care team collaboration (see longitudinal plan of care) ?Comprehensive medication review performed; medication list updated in electronic medical record ? ?Hypertension / CKD stage 4 (BP goal <130/80) ?-Controlled - per recent office visits ?-Pt follows with nephrology; has discussed renal transplant/dialysis - will need dialysis in his lifetime ?-Current treatment: ?Amlodipine 2.5 mg daily - Appropriate, Effective, Safe, Accessible ?Furosemide 20 mg daily -Appropriate, Effective, Safe, Accessible ?Metoprolol succinate 100 mg daily -Appropriate, Effective, Safe, Accessible ?Telmisartan 80 mg daily -Appropriate, Effective, Safe, Accessible ?-Medications previously tried: n/a  ?-Educated on BP goals and benefits of medications for prevention of heart attack, stroke and kidney  damage; ?-Counseled to monitor BP at home periodically ?-Recommended to continue current medication ? ?Hyperlipidemia: (LDL goal < 70) ?-Uncontrolled - LDL 144 (01/2021); ezetimibe was started 03/2021, no repeat lipid panel yet. Per chart review, cardiology is considering Nexlizet depending on next lipid results - labs have been ordered to [place] ?-Hx aortic atherosclerosis; ASCVD risk 35% ?-Current treatment: ?Ezetimibe 10 mg daily - Appropriate, Query Effective ?Nitroglycerin 0.4 mg SL prn ?-Medications previously tried: rosuvastatin 20 mg, simvastatin 40 mg ?-Educated on Cholesterol goals;  ?-Recommended to continue current medication; repeat lipid panel ? ?Diabetes (A1c goal <7%) ?-Not ideally controlled - A1c 8.8 (06/2021); improving on Ozempic (copay card at pharmacy), pt has titrated Levemir down from 27 to 20-21 units daily ?-Current home glucose readings: Freestyle Libre 2 - reviewed AGP 09/05/21-09/18/21 ? TIR 61%; high (181-250) 28%, very high (>250): 11% ? Average glucose 176; GMI 7.5% ? ?-Current meal patterns:  ?breakfast: eggs, bacon, whole wheat toast, grits  ?Snack: apple ?-Current medications: ?Levemir 20-21 units daily HS - Appropriate, Effective, Safe, Accessible ?Ozempic 0.5 mg weekly (copay card) - Appropriate, Query Effective ?Farxiga 10 mg daily - Appropriate, Query Effective (GFR < 30) ?Freestyle Libre 2 ?-Medications previously tried: n/a  ?-Educated on A1c and blood sugar goals;Benefits of weight loss; ?-Counseled on diet and exercise extensively - advised to limit grits/potatoes, maximize protein/vegetables in diet ?-Discussed AGP - fasting BG is near goal, post-prandial BG remains elevated - highest sugars tned to be around noon. ?-Recommend to increase Ozempic to 1 mg weekly; continue tapering down on Levemir based on fasting sugars ? ?Patient Goals/Self-Care Activities ?Patient will:  ?- take medications as prescribed as evidenced by patient report and record review ?focus on  medication adherence by routine ?check  glucose via CGM, document, and provide at future appointments ?engage in dietary modifications by limiting carbs ?  ?  ? ?Mr. Kurtzman was given information about Chronic Care Management services today including:  ?CCM service includes personalized support from designated clinical staff supervised by his physician, including individualized plan of care and coordination with other care providers ?24/7 contact phone numbers for assistance for urgent and routine care needs. ?Standard insurance, coinsurance, copays and deductibles apply for chronic care management only during months in which we provide at least 20 minutes of these services. Most insurances cover these services at 100%, however patients may be responsible for any copay, coinsurance and/or deductible if applicable. This service may help you avoid the need for more expensive face-to-face services. ?Only one practitioner may furnish and bill the service in a calendar month. ?The patient may stop CCM services at any time (effective at the end of the month) by phone call to the office staff. ? ?Patient agreed to services and verbal consent obtained.  ? ?Patient verbalizes understanding of instructions and care plan provided today and agrees to view in [facility]. Active MyChart status confirmed with patient.   ?Telephone follow up appointment with pharmacy team member scheduled for: 6 weeks ? ?Charlene Brooke, PharmD, BCACP ?Clinical Pharmacist ?[facility] ?[phone]  ?

## 2021-09-18 NOTE — Addendum Note (Signed)
Addended by: Charlton Haws on: 09/18/2021 12:26 PM ? ? Modules accepted: Orders ? ?

## 2021-09-18 NOTE — Telephone Encounter (Signed)
Patient is tolerating Ozempic 0.5 mg weekly well. Per Freestyle Libre: post-prandial sugars remain elevated (> 180) 39% of the time. Fasting sugars close to goal 120-140, he has tapered Levemir down to 20-21 units ? ?Reviewed AGP 09/05/21-09/18/21 ? TIR 61%; high (181-250) 28%, very high (>250): 11% ? Average glucose 176; GMI 7.5% ? ? ?Recommend to increase Ozempic to 1 mg weekly. ? ?Routing to PCP for approval. ?

## 2021-09-19 MED ORDER — SEMAGLUTIDE (1 MG/DOSE) 4 MG/3ML ~~LOC~~ SOPN: 1.0000 mg | PEN_INJECTOR | SUBCUTANEOUS | 5 refills | Status: DC

## 2021-09-19 NOTE — Telephone Encounter (Signed)
Agree.  Thanks.  ?I sent the rx.   ?

## 2021-09-19 NOTE — Addendum Note (Signed)
Addended by: Tonia Ghent on: 09/19/2021 08:25 AM ? ? Modules accepted: Orders ? ?

## 2021-09-21 LAB — LIPID PANEL
Chol/HDL Ratio: 3.8 ratio (ref 0.0–5.0)
Cholesterol, Total: 177 mg/dL (ref 100–199)
HDL: 46 mg/dL (ref >39)
LDL Chol Calc (NIH): 98 mg/dL (ref 0–99)
Triglycerides: 192 mg/dL — ABNORMAL HIGH (ref 0–149)
VLDL Cholesterol Cal: 33 mg/dL (ref 5–40)

## 2021-09-21 LAB — ALT: ALT: 37 IU/L (ref 0–44)

## 2021-09-28 ENCOUNTER — Encounter: Payer: Self-pay | Admitting: Pharmacist

## 2021-09-28 ENCOUNTER — Telehealth: Payer: Self-pay | Admitting: Pharmacist

## 2021-09-28 DIAGNOSIS — E782 Mixed hyperlipidemia: Secondary | ICD-10-CM

## 2021-09-28 MED ORDER — NEXLIZET 180-10 MG PO TABS: 1.0000 | ORAL_TABLET | Freq: Every day | ORAL | 3 refills | Status: DC

## 2021-09-28 NOTE — Telephone Encounter (Signed)
Rx for Nexlizet sent. Stop zetia ?Copay card activated ? ?RxBIN [id number] ?PCN [employer] ?GRP [id number] ?ID [id number] ? ?Mychart message sent to patient ?

## 2021-10-12 ENCOUNTER — Ambulatory Visit: Payer: Medicare Other | Admitting: Family Medicine

## 2021-10-16 ENCOUNTER — Other Ambulatory Visit: Payer: Self-pay | Admitting: Family Medicine

## 2021-10-18 DIAGNOSIS — E785 Hyperlipidemia, unspecified: Secondary | ICD-10-CM | POA: Diagnosis not present

## 2021-10-18 DIAGNOSIS — E1122 Type 2 diabetes mellitus with diabetic chronic kidney disease: Secondary | ICD-10-CM

## 2021-10-18 DIAGNOSIS — N184 Chronic kidney disease, stage 4 (severe): Secondary | ICD-10-CM | POA: Diagnosis not present

## 2021-10-18 DIAGNOSIS — Z87891 Personal history of nicotine dependence: Secondary | ICD-10-CM

## 2021-10-18 DIAGNOSIS — I129 Hypertensive chronic kidney disease with stage 1 through stage 4 chronic kidney disease, or unspecified chronic kidney disease: Secondary | ICD-10-CM

## 2021-10-18 DIAGNOSIS — Z794 Long term (current) use of insulin: Secondary | ICD-10-CM

## 2021-10-18 DIAGNOSIS — Z7985 Long-term (current) use of injectable non-insulin antidiabetic drugs: Secondary | ICD-10-CM

## 2021-10-26 ENCOUNTER — Ambulatory Visit (INDEPENDENT_AMBULATORY_CARE_PROVIDER_SITE_OTHER): Payer: Medicare Other | Admitting: Family Medicine

## 2021-10-26 ENCOUNTER — Encounter: Payer: Self-pay | Admitting: Family Medicine

## 2021-10-26 VITALS — BP 134/80 | HR 78 | Temp 97.3°F | Ht 70.0 in | Wt 237.4 lb

## 2021-10-26 DIAGNOSIS — E1122 Type 2 diabetes mellitus with diabetic chronic kidney disease: Secondary | ICD-10-CM | POA: Diagnosis not present

## 2021-10-26 LAB — POCT GLYCOSYLATED HEMOGLOBIN (HGB A1C): Hemoglobin A1C: 7.4 % — AB (ref 4.0–5.6)

## 2021-10-26 MED ORDER — INSULIN DETEMIR 100 UNIT/ML FLEXPEN: SUBCUTANEOUS | 12 refills | Status: DC

## 2021-10-26 NOTE — Progress Notes (Signed)
He hasn't been able to start nexlizet yet, d/w pt.  He'll check with pharmacy.    Diabetes:  Using medications without difficulties: yes Hypoglycemic episodes: no Hyperglycemic episodes:no Feet problems:no Blood Sugars averaging: 90-120s.  eye exam within last year: yes He is down to 6 units of insulin.   A1c improved to 7.4, d/w pt at OV.    Weight down from 251 to 237.  D/w pt.    Meds, vitals, and allergies reviewed.   ROS: Per HPI unless specifically indicated in ROS section   GEN: nad, alert and oriented HEENT: ncat NECK: supple w/o LA CV: rrr. PULM: ctab, no inc wob ABD: soft, +bs EXT: no edema SKIN: no acute rash  Diabetic foot exam: Normal inspection No skin breakdown No calluses  Normal DP pulses Normal sensation to light touch and monofilament Nails normal

## 2021-10-26 NOTE — Patient Instructions (Addendum)
Check with [facility] [place] [place] [facility] [place] [place] about nexlizet.    Take care.  Glad to see you.  Stop but don't get rid of the insulin.  If your sugar is going back up above target/140 in the AM, then restart it.    Plan on follow up in about 3-6 months, around the time of your kidney clinic follow up.

## 2021-10-27 NOTE — Assessment & Plan Note (Addendum)
He is down to 6 units of insulin.   A1c improved to 7.4, d/w pt at OV.    Weight down from 251 to 237.  D/w pt.    Discussed options. Stop insulin.  If sugar is going back up above target/140 in the AM, then restart insulin as is.  Continue Ozempic and [place].  He is going to check with pharmacy about getting nexlizet filled.  Plan on follow up in about 3-6 months, around the time of kidney clinic follow up.

## 2021-11-06 ENCOUNTER — Telehealth: Payer: Self-pay

## 2021-11-06 NOTE — Chronic Care Management (AMB) (Signed)
    Chronic Care Management Pharmacy Assistant   Name: Luis Joseph  MRN: [id number] DOB: March 14, 1956  Reason for Encounter: Reminder Call   Conditions to be addressed/monitored: HTN, HLD, DMII, and CKD Stage 3b   Medications: Outpatient Encounter Medications as of 11/06/2021  Medication Sig   allopurinol (ZYLOPRIM) 300 MG tablet TAKE 1 TABLET BY MOUTH EVERY DAY   amLODipine (NORVASC) 2.5 MG tablet Take 1 tablet (2.5 mg total) by mouth daily.   B-D UF III MINI PEN NEEDLES 31G X 5 MM MISC USE DAILY WITH INSULIN INJECTION   Bempedoic Acid-Ezetimibe (NEXLIZET) 180-10 MG TABS Take 1 tablet by mouth daily. Stop ezetimibe (Patient not taking: Reported on 10/26/2021)   clotrimazole-betamethasone (LOTRISONE) cream APPLY TO AFFECTED AREA TWICE A DAY   Continuous Blood Gluc Sensor (FREESTYLE LIBRE 2 SENSOR) MISC Apply sensor every 14 days to monitor sugar continously   dapagliflozin propanediol (FARXIGA) 10 MG TABS tablet Take 1 tablet (10 mg total) by mouth daily before breakfast.   furosemide (LASIX) 20 MG tablet Take 1 tablet (20 mg total) by mouth daily.   insulin detemir (LEVEMIR) 100 unit/ml SOLN Inject 6 Units into the skin daily. If AM sugar>140, then add 1 unit per day   Insulin Pen Needle (PEN NEEDLES) 30G X 5 MM MISC Use daily with insulin injection   metoprolol succinate (TOPROL-XL) 100 MG 24 hr tablet TAKE 1 TABLET BY MOUTH EVERY DAY WITH OR IMMEDIATELY FOLLOWING A MEAL   nitroGLYCERIN (NITROSTAT) 0.4 MG SL tablet Place 1 tablet (0.4 mg total) under the tongue every 5 (five) minutes as needed for chest pain.   Semaglutide, 1 MG/DOSE, 4 MG/3ML SOPN Inject 1 mg as directed once a week.   telmisartan (MICARDIS) 80 MG tablet TAKE 1 TABLET BY MOUTH 1 TIME EACH DAY.   No facility-administered encounter medications on file as of 11/06/2021.  Luis Joseph was contacted to remind of upcoming telephone visit with Charlene Brooke  on 11/09/21 at 11:94m. Patient was reminded to have any blood  glucose and blood pressure readings available for review at appointment.   Message was left reminding patient of appointment.    CCM referral has been placed prior to visit?  Yes    Star Rating Drugs: Medication:  Last Fill: Day Supply Farxiga 10mg   10/18/21 30 Levemir  08/24/21  90 Ozempic 1mg   09/19/21  28 Telmisartan 80mg  07/13/21 [place], CPP notified  Avel Sensor, [place]  [phone]

## 2021-11-09 ENCOUNTER — Ambulatory Visit (INDEPENDENT_AMBULATORY_CARE_PROVIDER_SITE_OTHER): Payer: Medicare Other | Admitting: Pharmacist

## 2021-11-09 DIAGNOSIS — E785 Hyperlipidemia, unspecified: Secondary | ICD-10-CM

## 2021-11-09 DIAGNOSIS — N184 Chronic kidney disease, stage 4 (severe): Secondary | ICD-10-CM

## 2021-11-09 DIAGNOSIS — N1832 Chronic kidney disease, stage 3b: Secondary | ICD-10-CM

## 2021-11-09 DIAGNOSIS — I10 Essential (primary) hypertension: Secondary | ICD-10-CM

## 2021-11-09 DIAGNOSIS — E1122 Type 2 diabetes mellitus with diabetic chronic kidney disease: Secondary | ICD-10-CM

## 2021-11-09 DIAGNOSIS — I700 Atherosclerosis of aorta: Secondary | ICD-10-CM

## 2021-11-09 NOTE — Progress Notes (Signed)
Chronic Care Management Pharmacy Note  11/09/2021 Name:  Luis Joseph MRN:  [id number] DOB:  Jun 11, 1955  Summary: CCM Initial visit -DM: A1c 7.4% (10/2021); Pt is tolerating Ozempic 1 mg weekly well. Per [employer] (see A/P for graph), Avg sugar 143 (GMI 6.7%) the last 2 weeks; post-prandial sugars improved on higher dose fo Ozempic, he is off insulin  -HLD: LDL 98 (09/2021), improved from 144 on ezetimibe. Cardiologist switched to Nexlizet to achieve LDL goal < 70, but pt cannot afford this ($169/month [facility] card [facility]), there are no other options for copay assistance currently as [place] is closed. He has continued ezetimibe in the meantime  Recommendations/Changes made from today's visit: -Advised pt to continue ezetimibe; f/u with cardiologist   Plan: -Pharmacist follow up televisit scheduled for 3 months    Subjective: Luis Joseph is an 66 y.o. year old male who is a primary patient of Damita Dunnings, Elveria Rising, MD.  The CCM team was consulted for assistance with disease management and care coordination needs.    Engaged with patient by telephone for follow up visit in response to provider referral for pharmacy case management and/or care coordination services.   Consent to Services:  The patient was given information about Chronic Care Management services, agreed to services, and gave verbal consent prior to initiation of services.  Please see initial visit note for detailed documentation.   Patient Care Team: Tonia Ghent, MD as PCP - General Werner Lean, MD as PCP - Cardiology (Cardiology) Lavonia Dana, MD as Consulting Physician (Internal Medicine) Charlton Haws, [facility] as Pharmacist (Pharmacist)  Recent office visits: 10/26/21 Dr Damita Dunnings OV: f/u - A1c 7.4%; down to Levemir 6 units. Advised to stop insulin, if FBG > 140 restart insulin. Wt down 251 to 237.   07/19/21 phone call - start Ozempic 0.25 mg weekly 07/13/21 Dr Damita Dunnings OV: f/u DM.  A1c 8.8%; Increase Levemir to 27 units.   Recent consult visits: 09/28/21 cardiology TE - stop ezetimibe, start Nexlizet. Repeat lipids 3 months.  08/09/21 Dr Gasper Sells (Cardiology): f/u Aortic atherosclerosis. Repeat lipid/LFT ordered, considering Nexlizet as next option.  07/13/21 Dr Juleen China (Nephrology): f/u CKD stage 4. Discussed transplant, dialysis which pt will need in his lifetime. No med changes.  Hospital visits: None in previous 6 months   Objective:  Lab Results  Component Value Date   CREATININE 2.70 (H) 07/13/2021   BUN 62 (H) 07/13/2021   GFR 23.65 (L) 07/13/2021   EGFR 26 (L) 05/17/2021   GFRNONAA 28 (L) 06/20/2020   GFRAA 32 (L) 06/20/2020   NA 136 07/13/2021   K 4.9 07/13/2021   CALCIUM 9.2 07/13/2021   CALCIUM 9.5 07/13/2021   CO2 24 07/13/2021   GLUCOSE 161 (H) 07/13/2021    Lab Results  Component Value Date/Time   HGBA1C 7.4 (A) 10/26/2021 11:01 AM   HGBA1C 8.8 (A) 07/13/2021 12:21 PM   HGBA1C 8.0 01/27/2021 12:00 AM   HGBA1C 8.5 (H) 05/12/2020 09:38 AM   HGBA1C 7.6 (H) 12/14/2019 07:38 AM   GFR 23.65 (L) 07/13/2021 01:07 PM   GFR 26.80 (L) 05/12/2020 09:38 AM   MICROALBUR 58.3 (H) 07/13/2021 01:07 PM   MICROALBUR 25.4 (H) 12/09/2014 10:33 AM    Last diabetic Eye exam:  Lab Results  Component Value Date/Time   HMDIABEYEEXA No Retinopathy 08/08/2021 12:00 AM    Last diabetic Foot exam:  Lab Results  Component Value Date/Time   HMDIABFOOTEX yes 06/09/2010 12:00 AM  Lab Results  Component Value Date   CHOL 177 09/20/2021   HDL 46 09/20/2021   LDLCALC 98 09/20/2021   LDLDIRECT 127.0 03/22/2016   TRIG 192 (H) 09/20/2021   CHOLHDL 3.8 09/20/2021       Latest Ref Rng & Units 09/20/2021    8:35 AM 07/13/2021    1:07 PM 01/27/2021   12:00 AM  Hepatic Function  Total Protein 6.0 - 8.3 g/dL  7.4    Albumin 3.5 - 5.2 g/dL  3.9    AST 0 - 37 U/L  16  33      ALT 0 - 44 IU/L 37  18  28      Alk Phosphatase 39 - 117 U/L  119    Total  Bilirubin 0.2 - 1.2 mg/dL  0.4       This result is from an external source.    Lab Results  Component Value Date/Time   TSH 2.67 12/14/2019 05:27 PM   TSH 1.59 10/09/2016 09:20 AM       Latest Ref Rng & Units 07/13/2021    1:07 PM 01/27/2021   12:00 AM 06/20/2020    2:10 PM  CBC  WBC 4.0 - 10.5 K/uL 8.1   8.2   Hemoglobin 13.0 - 17.0 g/dL 13.8  14.7     13.7   Hematocrit 39.0 - 52.0 % 41.8   41.6   Platelets 150.0 - 400.0 K/uL 250.0  231     237      This result is from an external source.    Lab Results  Component Value Date/Time   VD25OH 42.79 06/10/2014 09:48 AM    Clinical ASCVD: Yes  The 10-year ASCVD risk score (Arnett DK, et al., 2019) is: 31.5%   Values used to calculate the score:     Age: 66 years     Sex: Male     Is Non-Hispanic African American: No     Diabetic: Yes     Tobacco smoker: No     Systolic Blood Pressure: 161 mmHg     Is BP treated: Yes     HDL Cholesterol: 46 mg/dL     Total Cholesterol: 177 mg/dL       07/13/2021   12:21 PM 05/12/2020    8:59 AM 11/11/2018    3:18 PM  Depression screen PHQ 2/9  Decreased Interest 0 0 0  Down, Depressed, Hopeless 0 0 0  PHQ - 2 Score 0 0 0     Social History   Tobacco Use  Smoking Status Former   Packs/day: 1.00   Years: 6.00   Total pack years: 6.00   Types: Cigarettes   Start date: 31   Quit date: 11/17/1977   Years since quitting: 44.0  Smokeless Tobacco Never   BP Readings from Last 3 Encounters:  10/26/21 134/80  08/09/21 128/82  07/13/21 126/70   Pulse Readings from Last 3 Encounters:  10/26/21 78  08/09/21 78  07/13/21 83   Wt Readings from Last 3 Encounters:  10/26/21 237 lb 6 oz (107.7 kg)  08/09/21 251 lb 3.2 oz (113.9 kg)  07/13/21 251 lb (113.9 kg)   BMI Readings from Last 3 Encounters:  10/26/21 34.06 kg/m  08/09/21 36.04 kg/m  07/13/21 36.01 kg/m    Assessment/Interventions: Review of patient past medical history, allergies, medications, health status,  including review of consultants reports, laboratory and other test data, was performed as part of comprehensive evaluation and provision of chronic  care management services.   SDOH:  (Social Determinants of Health) assessments and interventions performed: Yes SDOH Interventions    Flowsheet Row Most Recent Value  SDOH Interventions   Financial Strain Interventions Other (Comment)  [Ozempic, Nexlizet coupons]      SDOH Screenings   Alcohol Screen: Not on file  Depression (PHQ2-9): Low Risk  (07/13/2021)   Depression (PHQ2-9)    PHQ-2 Score: 0  Financial Resource Strain: Low Risk  (11/09/2021)   Overall Financial Resource Strain (CARDIA)    Difficulty of Paying Living Expenses: Not very hard  Food Insecurity: Not on file  Housing: Not on file  Physical Activity: Not on file  Social Connections: Not on file  Stress: Not on file  Tobacco Use: Medium Risk (10/26/2021)   Patient History    Smoking Tobacco Use: Former    Smokeless Tobacco Use: Never    Passive Exposure: Not on file  Transportation Needs: Not on file    CCM Care Plan  Allergies  Allergen Reactions   Codeine Sulfate Hives and Swelling   Sildenafil Other (See Comments)    flushing   Crestor [Rosuvastatin]     Myalgias.    Nsaids     CKD    Medications Reviewed Today     Reviewed by Charlton Haws, [facility] (Pharmacist) on 11/09/21 at 1219  Med List Status: <None>   Medication Order Taking? Sig Documenting Provider Last Dose Status Informant  allopurinol (ZYLOPRIM) 300 MG tablet [id number] Yes TAKE 1 TABLET BY MOUTH EVERY DAY Tonia Ghent, MD Taking Active   amLODipine (NORVASC) 2.5 MG tablet [id number] Yes Take 1 tablet (2.5 mg total) by mouth daily. Tonia Ghent, MD Taking Active   B-D UF III MINI PEN NEEDLES 31G X 5 MM MISC [id number] Yes USE DAILY WITH INSULIN INJECTION Tonia Ghent, MD Taking Active   clotrimazole-betamethasone Donalynn Furlong) cream [id number] Yes APPLY TO AFFECTED AREA TWICE A DAY  Tonia Ghent, MD Taking Active   Continuous Blood Gluc Sensor (FREESTYLE LIBRE 2 SENSOR) [place] [id number] Yes Apply sensor every 14 days to monitor sugar continously Tonia Ghent, MD Taking Active   dapagliflozin propanediol (FARXIGA) 10 MG TABS tablet [id number] Yes Take 1 tablet (10 mg total) by mouth daily before breakfast. Tonia Ghent, MD Taking Active   ezetimibe (ZETIA) 10 MG tablet [id number] Yes Take 10 mg by mouth daily. [provider] Taking Active   furosemide (LASIX) 20 MG tablet [id number] Yes Take 1 tablet (20 mg total) by mouth daily. Werner Lean, MD Taking Active   insulin detemir (LEVEMIR) 100 unit/ml SOLN [id number] Yes Inject 6 Units into the skin daily. If AM sugar>140, then add 1 unit per day Tonia Ghent, MD Taking Active   Insulin Pen Needle (PEN NEEDLES) 30G X 5 MM MISC [id number] Yes Use daily with insulin injection Tonia Ghent, MD Taking Active   metoprolol succinate (TOPROL-XL) 100 MG 24 hr tablet [id number] Yes TAKE 1 TABLET BY MOUTH EVERY DAY WITH OR IMMEDIATELY FOLLOWING A MEAL Tonia Ghent, MD Taking Active   nitroGLYCERIN (NITROSTAT) 0.4 MG SL tablet [id number] Yes Place 1 tablet (0.4 mg total) under the tongue every 5 (five) minutes as needed for chest pain. Tonia Ghent, MD Taking Active   Semaglutide, 1 MG/DOSE, 4 MG/3ML Bon[place] Aid [id number] Yes Inject 1 mg as directed once a week. Tonia Ghent, MD Taking Active   telmisartan (MICARDIS) 80 MG tablet [id number] Yes TAKE 1 TABLET BY MOUTH 1  TIME EACH DAY. 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 03/01/2014   Renal stone 04/24/2013   Medicare welcome exam 09/29/2012   Migraine headache 08/04/2012   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   Type 2 diabetes mellitus with diabetic chronic kidney disease ([place]) 03/17/2010   Type II diabetes mellitus, uncontrolled 03/17/2010   Mixed hyperlipidemia 05/21/1988   Gout 05/21/1985   Essential hypertension 05/21/1978    Immunization History  Administered Date(s) Administered   Fluad Quad(high Dose 65+) 04/13/2020   Influenza Whole 04/05/2009, 03/17/2010   Influenza, High Dose Seasonal PF 04/17/2019   Influenza-Unspecified 02/08/2014, 04/21/2015, 03/21/2017, 02/18/2018, 04/04/2021   PFIZER(Purple Top)SARS-COV-2 Vaccination 07/19/2019, 08/18/2019, 04/13/2020   Pneumococcal Polysaccharide-23 09/29/2012, 01/01/2020   Td 08/19/2001   Tdap 09/20/2011    Conditions to be addressed/monitored:  Hypertension, Hyperlipidemia, Diabetes, and Chronic Kidney Disease  Care Plan : [place]  Updates made by Charlton Haws, [place] since 11/09/2021 12:00 AM     Problem: Hypertension, Hyperlipidemia, Diabetes, and Chronic Kidney Disease   Priority: High     Long-Range Goal: Disease mgmt   Start Date: 09/18/2021  Expected End Date: 09/19/2022  This Visit's Progress: On track  Recent Progress: On track  Priority: High  Note:   Current Barriers:  Unable to independently afford treatment regimen Suboptimal therapeutic regimen for HLD  Pharmacist Clinical Goal(s):  Patient will verbalize ability to afford treatment regimen adhere to plan to optimize therapeutic regimen for HLD as evidenced by report of adherence to recommended medication management changes through collaboration with PharmD and provider.   Interventions: 1:1 collaboration with Tonia Ghent, MD regarding development and update of comprehensive plan of care as evidenced by  provider attestation and co-signature Inter-disciplinary care team collaboration (see longitudinal plan of care) Comprehensive medication review performed; medication list updated in electronic medical record  Hypertension / CKD stage 4 (BP goal <130/80) -Controlled - per recent office visits -Pt follows with nephrology; has discussed renal transplant/dialysis - will need dialysis in his lifetime -Current treatment: Amlodipine 2.5 mg daily - Appropriate, Effective, Safe, Accessible Furosemide 20 mg daily -Appropriate, Effective, Safe, Accessible Metoprolol succinate 100 mg daily -Appropriate, Effective, Safe, Accessible Telmisartan 80 mg daily -Appropriate, Effective, Safe, Accessible -Medications previously tried: n/a  -Educated on BP goals and benefits of medications for prevention of heart attack, stroke and kidney damage; -Counseled to monitor BP at home periodically -Recommended to continue current medication  Hyperlipidemia: (LDL goal < 70) -Query controlled - LDL 98 (09/2021) - improved from 144 on ezetimibe, which has since been changed to Nexlizet to target LDL goal < 70; pt did not pick up Nexlizet due to high cost (>$500), he was not aware of coupon info sent to his mychart; he has continued ezetimibe in the meantime -Hx aortic atherosclerosis; ASCVD risk 35% -Current treatment: Nexlizet 180-10 mg daily - not started due to high cost (>$500) Ezetimibe 10 mg daily - Appropriate, Query Effective Nitroglycerin 0.4 mg SL prn -Medications previously  tried: rosuvastatin 20 mg, simvastatin 40 mg -Educated on Cholesterol goals;  -[facility] - they added coupon and cost is $169 for 30-ds, per pt this is still unaffordable for him; there are no other options for cost assistance at this time as [place] is closed -Recommended to continue ezetimibe 10 mg and follow up with cardiology  Diabetes (A1c goal <7%) -Not ideally controlled, improving - A1c 7.4%  (10/2021); pt has been off insulin for 2 weeks and current AGP report reflects being off insulin -Current home glucose readings: Freestyle Libre 2  Reviewed AGP report: 10/27/21 to 11/09/21  Time in range (70-180): 84% (goal > 70%)  High (181-250): 16%  Low (< 70): 0% (goal < 4%)  GMI: 6.7%; Average glucose: 143  -Current medications: Levemir 6 units daily HS - not taking Ozempic 1 mg weekly (copay card) - Appropriate, Effective, Safe, Accessible Farxiga 10 mg daily - Appropriate, Query Effective (GFR < 30) Freestyle Libre 2 -Medications previously tried: n/a  -Educated on A1c and blood sugar goals;Benefits of weight loss; -Counseled on diet and exercise extensively - advised to limit grits/potatoes, maximize protein/vegetables in diet -Discussed AGP - fasting BG at goal; post-prandial highs have improved with higher dose of Ozempic and pt has been watching diet more; GMI predicts A1c < 7% at this point -Recommend to continue current medication  Chronic Kidney Disease Stage 4  -All medications assessed for renal dosing and appropriateness in chronic kidney disease. -Follows with Dr Juleen China (last seen 06/2021, RTC 6 months); has discussed transplant, dialysis which pt will need in his lifetime -Recommended to continue current medication  Patient Goals/Self-Care Activities Patient will:  - take medications as prescribed as evidenced by patient report and record review focus on medication adherence by routine check glucose via CGM, document, and provide at future appointments engage in dietary modifications by limiting carbs     Medication Assistance:  [place] card BIN [id number] PCN [id number] GRP [id number] ID [id number] Nexlizet - copay card $169 per 30 ds RxBIN [id number] PCN [employer] GRP [id number] ID [id number]  Compliance/Adherence/Medication fill history: Care Gaps: Colonoscopy (due 03/02/21)  Star-Rating Drugs: Wilder Glade - PDC 100% Telmisartan - PDC 98% Ozempic - PDC  91%  Medication Access: Within the past 30 days, how often has patient missed a dose of medication? 0 Is a pillbox or other method used to improve adherence? Yes  Factors that may affect medication adherence? financial need Are meds synced by current pharmacy? No  Are meds delivered by current pharmacy? No  Does patient experience delays in picking up medications due to transportation concerns? No   [facility] Services Reviewed: Is patient disadvantaged to use [facility]?: Yes  Current Rx insurance plan: [employer] Name and location of [facility] pharmacy[facility] [place] [address] [place] [address] Phone: [phone] Fax: [phone]  [facility] services reviewed with patient today?: No  Reason patient declined to change pharmacies: Disadvantaged due to insurance/mail order  Care Plan and Follow Up Patient Decision:  Patient agrees to Care Plan and Follow-up.  Plan: Telephone follow up appointment with care management team member scheduled for:  3 months  Charlene Brooke, PharmD, BCACP Clinical Pharmacist [facility] [phone]

## 2021-11-17 DIAGNOSIS — N184 Chronic kidney disease, stage 4 (severe): Secondary | ICD-10-CM | POA: Diagnosis not present

## 2021-11-17 DIAGNOSIS — E785 Hyperlipidemia, unspecified: Secondary | ICD-10-CM

## 2021-11-17 DIAGNOSIS — E1122 Type 2 diabetes mellitus with diabetic chronic kidney disease: Secondary | ICD-10-CM

## 2021-11-17 DIAGNOSIS — Z7985 Long-term (current) use of injectable non-insulin antidiabetic drugs: Secondary | ICD-10-CM

## 2021-11-17 DIAGNOSIS — I129 Hypertensive chronic kidney disease with stage 1 through stage 4 chronic kidney disease, or unspecified chronic kidney disease: Secondary | ICD-10-CM

## 2021-12-05 ENCOUNTER — Telehealth: Payer: Self-pay | Admitting: Pharmacist

## 2021-12-05 NOTE — Telephone Encounter (Signed)
Patient reports his Freestyle Libre sensor fell off early at [employer] and he needs a replacement.  Advised he call Freestyle Libre directly for replacement sensor: [phone]  It is also almost time for his regular refill of FL2 sensors. Advised he call [place] supply to request refill: [phone].  Pt voiced understanding of above.

## 2021-12-07 ENCOUNTER — Telehealth: Payer: Self-pay | Admitting: Family Medicine

## 2021-12-07 NOTE — Telephone Encounter (Signed)
LVM for pt to rtn my call to schedule AWV with [facility] call back # [phone] 

## 2021-12-27 ENCOUNTER — Other Ambulatory Visit: Payer: Self-pay | Admitting: Internal Medicine

## 2021-12-27 ENCOUNTER — Ambulatory Visit (INDEPENDENT_AMBULATORY_CARE_PROVIDER_SITE_OTHER): Payer: Medicare Other

## 2021-12-27 ENCOUNTER — Other Ambulatory Visit: Payer: Self-pay | Admitting: Family Medicine

## 2021-12-27 VITALS — Wt 237.0 lb

## 2021-12-27 DIAGNOSIS — Z0000 Encounter for general adult medical examination without abnormal findings: Secondary | ICD-10-CM

## 2021-12-27 DIAGNOSIS — Z1211 Encounter for screening for malignant neoplasm of colon: Secondary | ICD-10-CM

## 2021-12-27 NOTE — Patient Instructions (Signed)
Luis Joseph , Thank you for taking time to come for your Medicare Wellness Visit. I appreciate your ongoing commitment to your health goals. Please review the following plan we discussed and let me know if I can assist you in the future.   Screening recommendations/referrals: Colonoscopy: 03/02/20- referral sent Recommended yearly ophthalmology/optometry visit for glaucoma screening and checkup Recommended yearly dental visit for hygiene and checkup  Vaccinations: Influenza vaccine: 04/04/21 Pneumococcal vaccine: 01/01/20 Tdap vaccine: 09/20/11 Shingles vaccine: n/d   Covid-19: 07/19/19, 08/18/19, 04/13/20  Advanced directives: no  Conditions/risks identified: none  Next appointment: Follow up in one year for your annual wellness visit. 12/31/22 @ 1:15 pm by phone  Preventive Care 68 Years and Older, Male Preventive care refers to lifestyle choices and visits with your health care provider that can promote health and wellness. What does preventive care include? A yearly physical exam. This is also called an annual well check. Dental exams once or twice a year. Routine eye exams. Ask your health care provider how often you should have your eyes checked. Personal lifestyle choices, including: Daily care of your teeth and gums. Regular physical activity. Eating a healthy diet. Avoiding tobacco and drug use. Limiting alcohol use. Practicing safe sex. Taking low doses of aspirin every day. Taking vitamin and mineral supplements as recommended by your health care provider. What happens during an annual well check? The services and screenings done by your health care provider during your annual well check will depend on your age, overall health, lifestyle risk factors, and family history of disease. Counseling  Your health care provider may ask you questions about your: Alcohol use. Tobacco use. Drug use. Emotional well-being. Home and relationship well-being. Sexual activity. Eating  habits. History of falls. Memory and ability to understand (cognition). Work and work Statistician. Screening  You may have the following tests or measurements: Height, weight, and BMI. Blood pressure. Lipid and cholesterol levels. These may be checked every 5 years, or more frequently if you are over 33 years old. Skin check. Lung cancer screening. You may have this screening every year starting at age 68 if you have a 30-pack-year history of smoking and currently smoke or have quit within the past 15 years. Fecal occult blood test (FOBT) of the stool. You may have this test every year starting at age 68. Flexible sigmoidoscopy or colonoscopy. You may have a sigmoidoscopy every 5 years or a colonoscopy every 10 years starting at age 68. Prostate cancer screening. Recommendations will vary depending on your family history and other risks. Hepatitis C blood test. Hepatitis B blood test. Sexually transmitted disease (STD) testing. Diabetes screening. This is done by checking your blood sugar (glucose) after you have not eaten for a while (fasting). You may have this done every 1-3 years. Abdominal aortic aneurysm (AAA) screening. You may need this if you are a current or former smoker. Osteoporosis. You may be screened starting at age 68 if you are at high risk. Talk with your health care provider about your test results, treatment options, and if necessary, the need for more tests. Vaccines  Your health care provider may recommend certain vaccines, such as: Influenza vaccine. This is recommended every year. Tetanus, diphtheria, and acellular pertussis (Tdap, Td) vaccine. You may need a Td booster every 10 years. Zoster vaccine. You may need this after age 68. Pneumococcal 13-valent conjugate (PCV13) vaccine. One dose is recommended after age 68. Pneumococcal polysaccharide (PPSV23) vaccine. One dose is recommended after age 68. Talk to your  health care provider about which screenings and  vaccines you need and how often you need them. This information is not intended to replace advice given to you by your health care provider. Make sure you discuss any questions you have with your health care provider. Document Released: 06/03/2015 Document Revised: 01/25/2016 Document Reviewed: 03/08/2015 Elsevier Interactive Patient Education  2017 [employer] Prevention in the Home Falls can cause injuries. They can happen to people of all ages. There are many things you can do to make your home safe and to help prevent falls. What can I do on the outside of my home? Regularly fix the edges of walkways and driveways and fix any cracks. Remove anything that might make you trip as you walk through a door, such as a raised step or threshold. Trim any bushes or trees on the path to your home. Use bright outdoor lighting. Clear any walking paths of anything that might make someone trip, such as rocks or tools. Regularly check to see if handrails are loose or broken. Make sure that both sides of any steps have handrails. Any raised decks and porches should have guardrails on the edges. Have any leaves, snow, or ice cleared regularly. Use sand or salt on walking paths during winter. Clean up any spills in your garage right away. This includes oil or grease spills. What can I do in the bathroom? Use night lights. Install grab bars by the toilet and in the tub and shower. Do not use towel bars as grab bars. Use non-skid mats or decals in the tub or shower. If you need to sit down in the shower, use a plastic, non-slip stool. Keep the floor dry. Clean up any water that spills on the floor as soon as it happens. Remove soap buildup in the tub or shower regularly. Attach bath mats securely with double-sided non-slip rug tape. Do not have throw rugs and other things on the floor that can make you trip. What can I do in the bedroom? Use night lights. Make sure that you have a light by your  bed that is easy to reach. Do not use any sheets or blankets that are too big for your bed. They should not hang down onto the floor. Have a firm chair that has side arms. You can use this for support while you get dressed. Do not have throw rugs and other things on the floor that can make you trip. What can I do in the kitchen? Clean up any spills right away. Avoid walking on wet floors. Keep items that you use a lot in easy-to-reach places. If you need to reach something above you, use a strong step stool that has a grab bar. Keep electrical cords out of the way. Do not use floor polish or wax that makes floors slippery. If you must use wax, use non-skid floor wax. Do not have throw rugs and other things on the floor that can make you trip. What can I do with my stairs? Do not leave any items on the stairs. Make sure that there are handrails on both sides of the stairs and use them. Fix handrails that are broken or loose. Make sure that handrails are as long as the stairways. Check any carpeting to make sure that it is firmly attached to the stairs. Fix any carpet that is loose or worn. Avoid having throw rugs at the top or bottom of the stairs. If you do have throw rugs, attach them  to the floor with carpet tape. Make sure that you have a light switch at the top of the stairs and the bottom of the stairs. If you do not have them, ask someone to add them for you. What else can I do to help prevent falls? Wear shoes that: Do not have high heels. Have rubber bottoms. Are comfortable and fit you well. Are closed at the toe. Do not wear sandals. If you use a stepladder: Make sure that it is fully opened. Do not climb a closed stepladder. Make sure that both sides of the stepladder are locked into place. Ask someone to hold it for you, if possible. Clearly mark and make sure that you can see: Any grab bars or handrails. First and last steps. Where the edge of each step is. Use tools that  help you move around (mobility aids) if they are needed. These include: Canes. Walkers. Scooters. Crutches. Turn on the lights when you go into a dark area. Replace any light bulbs as soon as they burn out. Set up your furniture so you have a clear path. Avoid moving your furniture around. If any of your floors are uneven, fix them. If there are any pets around you, be aware of where they are. Review your medicines with your doctor. Some medicines can make you feel dizzy. This can increase your chance of falling. Ask your doctor what other things that you can do to help prevent falls. This information is not intended to replace advice given to you by your health care provider. Make sure you discuss any questions you have with your health care provider. Document Released: 03/03/2009 Document Revised: 10/13/2015 Document Reviewed: 06/11/2014 Elsevier Interactive Patient Education  2017 [employer].
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
## 2022-01-01 ENCOUNTER — Other Ambulatory Visit: Payer: Self-pay

## 2022-01-01 ENCOUNTER — Telehealth: Payer: Self-pay

## 2022-01-01 DIAGNOSIS — Z1211 Encounter for screening for malignant neoplasm of colon: Secondary | ICD-10-CM

## 2022-01-01 MED ORDER — NA SULFATE-K SULFATE-MG SULF 17.5-3.13-1.6 GM/177ML PO SOLN: 354.0000 mL | Freq: Once | ORAL | 0 refills | Status: AC

## 2022-01-01 NOTE — Addendum Note (Signed)
Addended by: Ulyess Blossom L on: 01/01/2022 03:35 PM   Modules accepted: Orders

## 2022-01-01 NOTE — Telephone Encounter (Signed)
Patient left vm returning call to schedule colonoscopy. Requests call back.

## 2022-01-01 NOTE — Telephone Encounter (Signed)
Gastroenterology Pre-Procedure Review  Request Date: 02/01/2022 Requesting Physician: Dr. Marius Ditch  PATIENT REVIEW QUESTIONS: The patient responded to the following health history questions as indicated:    1. Are you having any GI issues? no 2. Do you have a personal history of Polyps? no Need 2 day prep per last  3. Do you have a family history of Colon Cancer or Polyps? no 4. Diabetes Mellitus? no 5. Joint replacements in the past 12 months?no 6. Major health problems in the past 3 months?no 7. Any artificial heart valves, MVP, or defibrillator?no    MEDICATIONS & ALLERGIES:    Patient reports the following regarding taking any anticoagulation/antiplatelet therapy:   Plavix, Coumadin, Eliquis, Xarelto, Lovenox, Pradaxa, Brilinta, or Effient? no Aspirin? no  Patient confirms/reports the following medications:  Current Outpatient Medications  Medication Sig Dispense Refill   allopurinol (ZYLOPRIM) 300 MG tablet TAKE 1 TABLET BY MOUTH EVERY DAY 90 tablet 1   amLODipine (NORVASC) 2.5 MG tablet Take 1 tablet (2.5 mg total) by mouth daily. 90 tablet 3   B-D UF III MINI PEN NEEDLES 31G X 5 MM MISC USE DAILY WITH INSULIN INJECTION 100 each 1   clotrimazole-betamethasone (LOTRISONE) cream APPLY TO AFFECTED AREA TWICE A DAY 45 g 1   Continuous Blood Gluc Sensor (FREESTYLE LIBRE 2 SENSOR) MISC Apply sensor every 14 days to monitor sugar continously 2 each 5   dapagliflozin propanediol (FARXIGA) 10 MG TABS tablet Take 1 tablet (10 mg total) by mouth daily before breakfast.     ezetimibe (ZETIA) 10 MG tablet TAKE 1 TABLET BY MOUTH EVERY DAY 90 tablet 2   furosemide (LASIX) 20 MG tablet TAKE 1 TABLET BY MOUTH EVERY DAY 90 tablet 2   insulin detemir (LEVEMIR) 100 unit/ml SOLN Inject 6 Units into the skin daily. If AM sugar>140, then add 1 unit per day (Patient not taking: Reported on 12/27/2021) 30 mL 12   Insulin Pen Needle (PEN NEEDLES) 30G X 5 MM MISC Use daily with insulin injection 100 each 3    metoprolol succinate (TOPROL-XL) 100 MG 24 hr tablet TAKE 1 TABLET BY MOUTH EVERY DAY WITH OR IMMEDIATELY FOLLOWING A MEAL 90 tablet 0   metoprolol succinate (TOPROL-XL) 25 MG 24 hr tablet Take 25 mg by mouth 2 (two) times daily.     nitroGLYCERIN (NITROSTAT) 0.4 MG SL tablet Place 1 tablet (0.4 mg total) under the tongue every 5 (five) minutes as needed for chest pain. 50 tablet 3   Semaglutide, 1 MG/DOSE, 4 MG/3ML SOPN Inject 1 mg as directed once a week. 3 mL 5   telmisartan (MICARDIS) 80 MG tablet TAKE 1 TABLET BY MOUTH 1 TIME EACH DAY. 90 tablet 1   No current facility-administered medications for this visit.    Patient confirms/reports the following allergies:  Allergies  Allergen Reactions   Codeine Sulfate Hives and Swelling   Sildenafil Other (See Comments)    flushing   Crestor [Rosuvastatin]     Myalgias.    Nsaids     CKD    No orders of the defined types were placed in this encounter.   AUTHORIZATION INFORMATION Primary Insurance: 1D#: Group #:  Secondary Insurance: 1D#: Group #:  SCHEDULE INFORMATION: Date:  Time: Location:

## 2022-01-10 ENCOUNTER — Other Ambulatory Visit: Payer: Self-pay | Admitting: Family Medicine

## 2022-01-15 ENCOUNTER — Telehealth: Payer: Self-pay | Admitting: Gastroenterology

## 2022-01-15 NOTE — Telephone Encounter (Signed)
Patient was a [place] patient. Reschedule to 02/08/2022. Called ENDO and talk to Dava and she will get patient reschedule

## 2022-01-15 NOTE — Telephone Encounter (Signed)
Called and left a message for call back  

## 2022-01-15 NOTE — Telephone Encounter (Signed)
Patient called to cancel colonoscopy. Requests call back to reschedule.

## 2022-02-05 ENCOUNTER — Ambulatory Visit (INDEPENDENT_AMBULATORY_CARE_PROVIDER_SITE_OTHER): Payer: Medicare Other | Admitting: Pharmacist

## 2022-02-05 DIAGNOSIS — N184 Chronic kidney disease, stage 4 (severe): Secondary | ICD-10-CM

## 2022-02-05 DIAGNOSIS — E785 Hyperlipidemia, unspecified: Secondary | ICD-10-CM

## 2022-02-05 DIAGNOSIS — I700 Atherosclerosis of aorta: Secondary | ICD-10-CM

## 2022-02-05 DIAGNOSIS — I10 Essential (primary) hypertension: Secondary | ICD-10-CM

## 2022-02-05 DIAGNOSIS — E1122 Type 2 diabetes mellitus with diabetic chronic kidney disease: Secondary | ICD-10-CM

## 2022-02-05 NOTE — Patient Instructions (Signed)
Visit Information  Phone number for Pharmacist: [phone]   Goals Addressed   None     Care Plan : [place]  Updates made by Charlton Haws, RPH since 02/05/2022 12:00 AM     Problem: Hypertension, Hyperlipidemia, Diabetes, and Chronic Kidney Disease   Priority: High     Long-Range Goal: Disease mgmt   Start Date: 09/18/2021  Expected End Date: 02/06/2023  This Visit's Progress: On track  Recent Progress: On track  Priority: High  Note:   Current Barriers:  Unable to independently afford treatment regimen Suboptimal therapeutic regimen for HLD  Pharmacist Clinical Goal(s):  Patient will verbalize ability to afford treatment regimen adhere to plan to optimize therapeutic regimen for HLD as evidenced by report of adherence to recommended medication management changes through collaboration with PharmD and provider.   Interventions: 1:1 collaboration with Tonia Ghent, MD regarding development and update of comprehensive plan of care as evidenced by provider attestation and co-signature Inter-disciplinary care team collaboration (see longitudinal plan of care) Comprehensive medication review performed; medication list updated in electronic medical record  Hypertension / CKD stage 4 (BP goal <130/80) -Controlled - per recent office visits -Pt follows with nephrology; has discussed renal transplant/dialysis - will need dialysis in his lifetime -Current treatment: Amlodipine 2.5 mg daily - Appropriate, Effective, Safe, Accessible Furosemide 20 mg daily -Appropriate, Effective, Safe, Accessible Metoprolol succinate 100 mg daily -Appropriate, Effective, Safe, Accessible Telmisartan 80 mg daily -Appropriate, Effective, Safe, Accessible -Medications previously tried: n/a  -Educated on BP goals and benefits of medications for prevention of heart attack, stroke and kidney damage; -Counseled to monitor BP at home periodically -Recommended to continue current  medication  Hyperlipidemia: (LDL goal < 70) -Query controlled - LDL 98 (09/2021) - improved from 144 on ezetimibe, pt could not afford Nexlzet ($169 with coupon) and was advised to follow up with cardiology -Hx aortic atherosclerosis; ASCVD risk 35% -Current treatment: Ezetimibe 10 mg daily - Appropriate, Query Effective Nitroglycerin 0.4 mg SL prn -Medications previously tried: rosuvastatin 20 mg, simvastatin 40 mg, Nexlizet (cost) -Educated on Cholesterol goals;  -Recommended to continue ezetimibe 10 mg and follow up with cardiology  Diabetes (A1c goal <7%) -Not ideally controlled, improving - A1c 7.4% (10/2021); pt has been off insulin -Pt reports bleeding with Freestyle Libre 2 sensors x 4 different times -Current home glucose readings: Freestyle Libre 2  Reviewed AGP report: 01/23/22 to 02/05/22. Sensor active: 74%  Time in range (70-180): 64% (goal > 70%)  High (>180): 36%  Low (< 70): 0% (goal < 4%)  GMI: 7.3%; Average glucose: 166  Previous AGP report: 10/27/21 to 11/09/21  Time in range (70-180): 84% (goal > 70%)  High (181-250): 16%  Low (< 70): 0% (goal < 4%)  GMI: 6.7%; Average glucose: 143  -Current medications: Levemir 6 units daily HS - not taking Ozempic 1 mg weekly (copay card) - Appropriate, Effective, Safe, Accessible Farxiga 10 mg daily - Appropriate, Query Effective (GFR < 30) Freestyle Libre 2 -Medications previously tried: Levemir -Educated on A1c and blood sugar goals;Benefits of weight loss; -Counseled on diet and exercise extensively - advised to limit grits/potatoes, maximize protein/vegetables in diet -Discussed AGP - slightly worse than 3 months ago, GMI now > 7% with more post-prandial spikes; discussed diet changes to improve this -Recommend to continue current medication; upgrade to [employer] 3 if insurance allows  Chronic Kidney Disease Stage 4  -All medications assessed for renal dosing and appropriateness in chronic kidney disease. -  Follows  with Dr Kolluru/Dr Holley Raring (last seen 12/2021, RTC 3 months); has discussed transplant, dialysis which pt will need in his lifetime -Recommended to continue current medication  Patient Goals/Self-Care Activities Patient will:  - take medications as prescribed as evidenced by patient report and record review focus on medication adherence by routine check glucose via CGM, document, and provide at future appointments engage in dietary modifications by limiting carbs      Patient verbalizes understanding of instructions and care plan provided today and agrees to view in [place]. Active MyChart status and patient understanding of how to access instructions and care plan via MyChart confirmed with patient.    Telephone follow up appointment with pharmacy team member scheduled for: 6 months  Charlene Brooke, PharmD, [facility] Clinical Pharmacist [facility] at [facility] [phone]

## 2022-02-05 NOTE — Progress Notes (Signed)
Chronic Care Management Pharmacy Note  02/05/2022 Name:  Luis Joseph MRN:  [id number] DOB:  December 15, 1955  Summary: CCM F/U visit -DM: A1c 7.4% (10/2021); pt is no longer using insulin, per AGP glucose control is slightly worse compared to 3 months ago Reviewed AGP report: 01/23/22 to 02/05/22. Sensor active: 74%  Time in range (70-180): 64% (goal > 70%)  High (>180): 36%  Low (< 70): 0% (goal < 4%)  GMI: 7.3%; Average glucose: 166 -HLD: LDL 98 (09/2021), improved from 144 on ezetimibe. Pt could not afford Nexlizet ($169 with coupon) and was advised to follow up with cardiologist  Recommendations/Changes made from today's visit: -Advised pt to continue ezetimibe; f/u with cardiologist   Plan: -Pharmacist follow up televisit scheduled for 6 months -PCP appt due Sept-Dec 2023    Subjective: Luis Joseph is an 66 y.o. year old male who is a primary patient of Damita Dunnings, Elveria Rising, MD.  The CCM team was consulted for assistance with disease management and care coordination needs.    Engaged with patient by telephone for follow up visit in response to provider referral for pharmacy case management and/or care coordination services.   Consent to Services:  The patient was given information about Chronic Care Management services, agreed to services, and gave verbal consent prior to initiation of services.  Please see initial visit note for detailed documentation.   Patient Care Team: Tonia Ghent, MD as PCP - General Werner Lean, MD as PCP - Cardiology (Cardiology) Lavonia Dana, MD as Consulting Physician (Internal Medicine) Charlton Haws, [facility] as Pharmacist (Pharmacist)  Recent office visits: 10/26/21 Dr Damita Dunnings OV: f/u - A1c 7.4%; down to Levemir 6 units. Advised to stop insulin, if FBG > 140 restart insulin. Wt down 251 to 237. F/U 3-6 months.  07/19/21 phone call - start Ozempic 0.25 mg weekly 07/13/21 Dr Damita Dunnings OV: f/u DM. A1c 8.8%; Increase Levemir to 27 units.    Recent consult visits: 01/17/22 Dr Holley Raring (Nephrology): f/u - continue Farxiga for CKD.  09/28/21 cardiology TE - stop ezetimibe, start Nexlizet. Repeat lipids 3 months. 08/09/21 Dr Gasper Sells (Cardiology): f/u Aortic atherosclerosis. Repeat lipid/LFT ordered, considering Nexlizet as next option.  07/13/21 Dr Juleen China (Nephrology): f/u CKD stage 4. Discussed transplant, dialysis which pt will need in his lifetime. No med changes.  Hospital visits: None in previous 6 months   Objective:  Lab Results  Component Value Date   CREATININE 2.70 (H) 07/13/2021   BUN 62 (H) 07/13/2021   GFR 23.65 (L) 07/13/2021   EGFR 26 (L) 05/17/2021   GFRNONAA 28 (L) 06/20/2020   GFRAA 32 (L) 06/20/2020   NA 136 07/13/2021   K 4.9 07/13/2021   CALCIUM 9.2 07/13/2021   CALCIUM 9.5 07/13/2021   CO2 24 07/13/2021   GLUCOSE 161 (H) 07/13/2021    Lab Results  Component Value Date/Time   HGBA1C 7.4 (A) 10/26/2021 11:01 AM   HGBA1C 8.8 (A) 07/13/2021 12:21 PM   HGBA1C 8.0 01/27/2021 12:00 AM   HGBA1C 8.5 (H) 05/12/2020 09:38 AM   HGBA1C 7.6 (H) 12/14/2019 07:38 AM   GFR 23.65 (L) 07/13/2021 01:07 PM   GFR 26.80 (L) 05/12/2020 09:38 AM   MICROALBUR 58.3 (H) 07/13/2021 01:07 PM   MICROALBUR 25.4 (H) 12/09/2014 10:33 AM    Last diabetic Eye exam:  Lab Results  Component Value Date/Time   HMDIABEYEEXA No Retinopathy 08/08/2021 12:00 AM    Last diabetic Foot exam:  Lab Results  Component Value Date/Time  HMDIABFOOTEX yes 06/09/2010 12:00 AM     Lab Results  Component Value Date   CHOL 177 09/20/2021   HDL 46 09/20/2021   LDLCALC 98 09/20/2021   LDLDIRECT 127.0 03/22/2016   TRIG 192 (H) 09/20/2021   CHOLHDL 3.8 09/20/2021       Latest Ref Rng & Units 09/20/2021    8:35 AM 07/13/2021    1:07 PM 01/27/2021   12:00 AM  Hepatic Function  Total Protein 6.0 - 8.3 g/dL  7.4    Albumin 3.5 - 5.2 g/dL  3.9    AST 0 - 37 U/L  16  33      ALT 0 - 44 IU/L 37  18  28      Alk Phosphatase 39 -  117 U/L  119    Total Bilirubin 0.2 - 1.2 mg/dL  0.4       This result is from an external source.    Lab Results  Component Value Date/Time   TSH 2.67 12/14/2019 05:27 PM   TSH 1.59 10/09/2016 09:20 AM       Latest Ref Rng & Units 07/13/2021    1:07 PM 01/27/2021   12:00 AM 06/20/2020    2:10 PM  CBC  WBC 4.0 - 10.5 K/uL 8.1   8.2   Hemoglobin 13.0 - 17.0 g/dL 13.8  14.7     13.7   Hematocrit 39.0 - 52.0 % 41.8   41.6   Platelets 150.0 - 400.0 K/uL 250.0  231     237      This result is from an external source.    Lab Results  Component Value Date/Time   VD25OH 42.79 06/10/2014 09:48 AM    Clinical ASCVD: Yes  The 10-year ASCVD risk score (Arnett DK, et al., 2019) is: 31.1%   Values used to calculate the score:     Age: 30 years     Sex: Male     Is Non-Hispanic African American: No     Diabetic: Yes     Tobacco smoker: No     Systolic Blood Pressure: 670 mmHg     Is BP treated: Yes     HDL Cholesterol: 46 mg/dL     Total Cholesterol: 177 mg/dL       12/27/2021    2:45 PM 07/13/2021   12:21 PM 05/12/2020    8:59 AM  Depression screen PHQ 2/9  Decreased Interest 0 0 0  Down, Depressed, Hopeless 0 0 0  PHQ - 2 Score 0 0 0  Altered sleeping 0    Tired, decreased energy 0    Change in appetite 0    Feeling bad or failure about yourself  0    Trouble concentrating 0    Moving slowly or fidgety/restless 0    Suicidal thoughts 0    PHQ-9 Score 0    Difficult doing work/chores Not difficult at all       Social History   Tobacco Use  Smoking Status Former   Packs/day: 1.00   Years: 6.00   Total pack years: 6.00   Types: Cigarettes   Start date: 35   Quit date: 11/17/1977   Years since quitting: 44.2  Smokeless Tobacco Never   BP Readings from Last 3 Encounters:  10/26/21 134/80  08/09/21 128/82  07/13/21 126/70   Pulse Readings from Last 3 Encounters:  10/26/21 78  08/09/21 78  07/13/21 83   Wt Readings from Last 3 Encounters:  12/27/21  237 lb  (107.5 kg)  10/26/21 237 lb 6 oz (107.7 kg)  08/09/21 251 lb 3.2 oz (113.9 kg)   BMI Readings from Last 3 Encounters:  12/27/21 34.01 kg/m  10/26/21 34.06 kg/m  08/09/21 36.04 kg/m    Assessment/Interventions: Review of patient past medical history, allergies, medications, health status, including review of consultants reports, laboratory and other test data, was performed as part of comprehensive evaluation and provision of chronic care management services.   SDOH:  (Social Determinants of Health) assessments and interventions performed: Yes SDOH Interventions    Flowsheet Row Clinical Support from 12/27/2021 in [place] at [place] Management from 11/09/2021 in [place] at [place] Interventions Intervention Not Indicated --  Housing Interventions Intervention Not Indicated --  Transportation Interventions Intervention Not Indicated --  Financial Strain Interventions Intervention Not Indicated Other (Comment)  [Ozempic, Nexlizet coupons]  Physical Activity Interventions Intervention Not Indicated --  Stress Interventions Intervention Not Indicated --  Social Connections Interventions Intervention Not Indicated --      SDOH Screenings   Food Insecurity: No Food Insecurity (12/27/2021)  Housing: Low Risk  (12/27/2021)  Transportation Needs: No Transportation Needs (12/27/2021)  Alcohol Screen: Low Risk  (12/27/2021)  Depression (PHQ2-9): Low Risk  (12/27/2021)  Financial Resource Strain: Low Risk  (12/27/2021)  Physical Activity: Insufficiently Active (12/27/2021)  Social Connections: Moderately Isolated (12/27/2021)  Stress: No Stress Concern Present (12/27/2021)  Tobacco Use: Medium Risk (12/27/2021)    CCM Care Plan  Allergies  Allergen Reactions   Codeine Sulfate Hives and Swelling   Sildenafil Other (See Comments)    flushing   Crestor [Rosuvastatin]     Myalgias.    Nsaids     CKD    Medications  Reviewed Today     Reviewed by Charlton Haws, [facility] (Pharmacist) on 02/05/22 at 1155  Med List Status: <None>   Medication Order Taking? Sig Documenting Provider Last Dose Status Informant  allopurinol (ZYLOPRIM) 300 MG tablet [id number] Yes TAKE 1 TABLET BY MOUTH EVERY DAY Tonia Ghent, MD Taking Active   amLODipine (NORVASC) 2.5 MG tablet [id number] Yes Take 1 tablet (2.5 mg total) by mouth daily. Tonia Ghent, MD Taking Active   B-D UF III MINI PEN NEEDLES 31G X 5 MM MISC [id number] Yes USE DAILY WITH INSULIN INJECTION Tonia Ghent, MD Taking Active   clotrimazole-betamethasone Donalynn Furlong) cream [id number] Yes APPLY TO AFFECTED AREA TWICE A DAY Tonia Ghent, MD Taking Active   Continuous Blood Gluc Sensor (FREESTYLE LIBRE 2 SENSOR) [place] [id number] Yes Apply sensor every 14 days to monitor sugar continously Tonia Ghent, MD Taking Active   dapagliflozin propanediol (FARXIGA) 10 MG TABS tablet [id number] Yes Take 1 tablet (10 mg total) by mouth daily before breakfast. Tonia Ghent, MD Taking Active   ezetimibe (ZETIA) 10 MG tablet [id number] Yes TAKE 1 TABLET BY MOUTH EVERY DAY Chandrasekhar, Mahesh A, MD Taking Active   furosemide (LASIX) 20 MG tablet [id number] Yes TAKE 1 TABLET BY MOUTH EVERY DAY Chandrasekhar, Mahesh A, MD Taking Active   insulin detemir (LEVEMIR) 100 unit/ml SOLN [id number] Yes Inject 6 Units into the skin daily. If AM sugar>140, then add 1 unit per day Tonia Ghent, MD Taking Active   Insulin Pen Needle (PEN NEEDLES) 30G X 5 MM MISC [id number] Yes Use daily with insulin injection Tonia Ghent, MD Taking Active   metoprolol succinate (TOPROL-XL) 100 MG 24 hr  tablet [id number] Yes TAKE 1 TABLET BY MOUTH EVERY DAY WITH OR IMMEDIATELY FOLLOWING A MEAL Tonia Ghent, MD Taking Active   metoprolol succinate (TOPROL-XL) 25 MG 24 hr tablet [id number] Yes Take 25 mg by mouth 2 (two) times daily. [provider] Taking Active   nitroGLYCERIN  (NITROSTAT) 0.4 MG SL tablet [id number] Yes Place 1 tablet (0.4 mg total) under the tongue every 5 (five) minutes as needed for chest pain. Tonia Ghent, MD Taking Active   Semaglutide, 1 MG/DOSE, 4 MG/3ML [place] Aid [id number] Yes Inject 1 mg as directed once a week. Tonia Ghent, MD Taking Active   telmisartan (MICARDIS) 80 MG tablet [id number] Yes TAKE 1 TABLET BY MOUTH 1 TIME EACH DAY. 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 03/01/2014   Renal stone 04/24/2013   Medicare welcome exam 09/29/2012   Migraine headache 08/04/2012   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   Type 2 diabetes mellitus with diabetic chronic kidney disease ([place]) 03/17/2010   Type II diabetes mellitus, uncontrolled 03/17/2010   Mixed hyperlipidemia 05/21/1988   Gout 05/21/1985   Essential hypertension 05/21/1978    Immunization History  Administered Date(s) Administered   Fluad Quad(high Dose 65+) 04/13/2020   Influenza Whole 04/05/2009, 03/17/2010   Influenza, High Dose Seasonal PF 04/17/2019   Influenza-Unspecified 02/08/2014, 04/21/2015, 03/21/2017, 02/18/2018, 04/04/2021   PFIZER(Purple Top)SARS-COV-2 Vaccination 07/19/2019, 08/18/2019, 04/13/2020   Pneumococcal Polysaccharide-23 09/29/2012, 01/01/2020   Td 08/19/2001   Tdap 09/20/2011    Conditions to be addressed/monitored:  Hypertension, Hyperlipidemia, Diabetes, and Chronic Kidney Disease  Care Plan : [place]  Updates made by Charlton Haws, [place] since  02/05/2022 12:00 AM     Problem: Hypertension, Hyperlipidemia, Diabetes, and Chronic Kidney Disease   Priority: High     Long-Range Goal: Disease mgmt   Start Date: 09/18/2021  Expected End Date: 02/06/2023  This Visit's Progress: On track  Recent Progress: On track  Priority: High  Note:   Current Barriers:  Unable to independently afford treatment regimen Suboptimal therapeutic regimen for HLD  Pharmacist Clinical Goal(s):  Patient will verbalize ability to afford treatment regimen adhere to plan to optimize therapeutic regimen for HLD as evidenced by report of adherence to recommended medication management changes through collaboration with PharmD and provider.   Interventions: 1:1 collaboration with Tonia Ghent, MD regarding development and update of comprehensive plan of care as evidenced by provider attestation and co-signature Inter-disciplinary care team collaboration (see longitudinal plan of care) Comprehensive medication review performed; medication list updated in electronic medical record  Hypertension / CKD stage 4 (BP goal <130/80) -Controlled - per recent office visits -Pt follows with nephrology; has discussed renal transplant/dialysis - will need dialysis in his lifetime -Current treatment: Amlodipine 2.5 mg daily - Appropriate, Effective, Safe, Accessible Furosemide 20 mg daily -Appropriate, Effective, Safe, Accessible Metoprolol succinate 100 mg daily -Appropriate, Effective, Safe, Accessible Telmisartan 80 mg daily -Appropriate, Effective, Safe, Accessible -Medications previously tried: n/a  -Educated on BP  goals and benefits of medications for prevention of heart attack, stroke and kidney damage; -Counseled to monitor BP at home periodically -Recommended to continue current medication  Hyperlipidemia: (LDL goal < 70) -Query controlled - LDL 98 (09/2021) - improved from 144 on ezetimibe, pt could not afford Nexlzet ($169 with coupon) and was advised to  follow up with cardiology -Hx aortic atherosclerosis; ASCVD risk 35% -Current treatment: Ezetimibe 10 mg daily - Appropriate, Query Effective Nitroglycerin 0.4 mg SL prn -Medications previously tried: rosuvastatin 20 mg, simvastatin 40 mg, Nexlizet (cost) -Educated on Cholesterol goals;  -Recommended to continue ezetimibe 10 mg and follow up with cardiology  Diabetes (A1c goal <7%) -Not ideally controlled, improving - A1c 7.4% (10/2021); pt has been off insulin -Pt reports bleeding with Freestyle Libre 2 sensors x 4 different times -Current home glucose readings: Freestyle Libre 2  Reviewed AGP report: 01/23/22 to 02/05/22. Sensor active: 74%  Time in range (70-180): 64% (goal > 70%)  High (>180): 36%  Low (< 70): 0% (goal < 4%)  GMI: 7.3%; Average glucose: 166  Previous AGP report: 10/27/21 to 11/09/21  Time in range (70-180): 84% (goal > 70%)  High (181-250): 16%  Low (< 70): 0% (goal < 4%)  GMI: 6.7%; Average glucose: 143  -Current medications: Levemir 6 units daily HS - not taking Ozempic 1 mg weekly (copay card) - Appropriate, Effective, Safe, Accessible Farxiga 10 mg daily - Appropriate, Query Effective (GFR < 30) Freestyle Libre 2 -Medications previously tried: Levemir -Educated on A1c and blood sugar goals;Benefits of weight loss; -Counseled on diet and exercise extensively - advised to limit grits/potatoes, maximize protein/vegetables in diet -Discussed AGP - slightly worse than 3 months ago, GMI now > 7% with more post-prandial spikes; discussed diet changes to improve this -Recommend to continue current medication; upgrade to [employer] 3 if insurance allows  Chronic Kidney Disease Stage 4  -All medications assessed for renal dosing and appropriateness in chronic kidney disease. -Follows with Dr Kolluru/Dr Holley Raring (last seen 12/2021, RTC 3 months); has discussed transplant, dialysis which pt will need in his lifetime -Recommended to continue current medication  Patient  Goals/Self-Care Activities Patient will:  - take medications as prescribed as evidenced by patient report and record review focus on medication adherence by routine check glucose via CGM, document, and provide at future appointments engage in dietary modifications by limiting carbs      Medication Assistance:  [place] card BIN [id number] PCN [id number] GRP [id number] ID [id number] Nexlizet - copay card $169 per 30 ds RxBIN [id number] PCN [employer] GRP [id number] ID [id number]  Compliance/Adherence/Medication fill history: Care Gaps: Colonoscopy (due 03/02/21)  Star-Rating Drugs: Wilder Glade - PDC 100% Telmisartan - PDC 98% Ozempic - PDC 91%  Medication Access: Within the past 30 days, how often has patient missed a dose of medication? 0 Is a pillbox or other method used to improve adherence? Yes  Factors that may affect medication adherence? financial need Are meds synced by current pharmacy? No  Are meds delivered by current pharmacy? No  Does patient experience delays in picking up medications due to transportation concerns? No   [facility] Services Reviewed: Is patient disadvantaged to use [facility]?: Yes  Current Rx insurance plan: [place] Name and location of Current pharmacy:  [facility] [address] [address] [place] [address] Phone: [phone] Fax: [phone]  [facility] services reviewed with patient today?: No  Reason patient declined to change pharmacies:  Disadvantaged due to insurance/mail order  Care Plan and Follow Up Patient Decision:  Patient agrees to Care Plan and Follow-up.  Plan: Telephone follow up appointment with care management team member scheduled for:  6 months  Charlene Brooke, PharmD, BCACP Clinical Pharmacist [facility] at [facility] [phone]

## 2022-02-08 ENCOUNTER — Ambulatory Visit: Payer: Medicare Other | Admitting: Anesthesiology

## 2022-02-08 ENCOUNTER — Encounter
Admission: RE | Disposition: A | Discharge status: Home / Self Care | Payer: Self-pay | Source: Ambulatory Visit | Attending: Gastroenterology

## 2022-02-08 ENCOUNTER — Other Ambulatory Visit: Payer: Self-pay

## 2022-02-08 ENCOUNTER — Telehealth: Payer: Medicare Other

## 2022-02-08 ENCOUNTER — Ambulatory Visit
Admission: RE | Admit: 2022-02-08 | Discharge: 2022-02-08 | Disposition: A | Discharge status: Home / Self Care | Payer: Medicare Other | Source: Ambulatory Visit | Attending: Gastroenterology | Admitting: Gastroenterology

## 2022-02-08 ENCOUNTER — Encounter: Payer: Self-pay | Admitting: Gastroenterology

## 2022-02-08 DIAGNOSIS — Z1211 Encounter for screening for malignant neoplasm of colon: Secondary | ICD-10-CM

## 2022-02-08 DIAGNOSIS — Z7984 Long term (current) use of oral hypoglycemic drugs: Secondary | ICD-10-CM | POA: Insufficient documentation

## 2022-02-08 DIAGNOSIS — Z794 Long term (current) use of insulin: Secondary | ICD-10-CM | POA: Diagnosis not present

## 2022-02-08 DIAGNOSIS — E119 Type 2 diabetes mellitus without complications: Secondary | ICD-10-CM | POA: Diagnosis not present

## 2022-02-08 DIAGNOSIS — Z87891 Personal history of nicotine dependence: Secondary | ICD-10-CM | POA: Diagnosis not present

## 2022-02-08 DIAGNOSIS — Z79899 Other long term (current) drug therapy: Secondary | ICD-10-CM | POA: Insufficient documentation

## 2022-02-08 DIAGNOSIS — N183 Chronic kidney disease, stage 3 unspecified: Secondary | ICD-10-CM | POA: Diagnosis not present

## 2022-02-08 DIAGNOSIS — I129 Hypertensive chronic kidney disease with stage 1 through stage 4 chronic kidney disease, or unspecified chronic kidney disease: Secondary | ICD-10-CM | POA: Insufficient documentation

## 2022-02-08 DIAGNOSIS — Z7985 Long-term (current) use of injectable non-insulin antidiabetic drugs: Secondary | ICD-10-CM | POA: Diagnosis not present

## 2022-02-08 DIAGNOSIS — K219 Gastro-esophageal reflux disease without esophagitis: Secondary | ICD-10-CM | POA: Diagnosis not present

## 2022-02-08 HISTORY — PX: COLONOSCOPY WITH PROPOFOL: SHX5780

## 2022-02-08 LAB — GLUCOSE, CAPILLARY: Glucose-Capillary: 186 mg/dL — ABNORMAL HIGH (ref 70–99)

## 2022-02-08 SURGERY — COLONOSCOPY WITH PROPOFOL
Anesthesia: General

## 2022-02-08 MED ORDER — PROPOFOL 500 MG/50ML IV EMUL
INTRAVENOUS | Status: DC | PRN
  Administered 2022-02-08: 165 ug/kg/min via INTRAVENOUS

## 2022-02-08 MED ORDER — SODIUM CHLORIDE 0.9 % IV SOLN: INTRAVENOUS | Status: DC

## 2022-02-08 MED ORDER — PROPOFOL 10 MG/ML IV BOLUS
INTRAVENOUS | Status: DC | PRN
  Administered 2022-02-08: 20 mg via INTRAVENOUS
  Administered 2022-02-08: 10 mg via INTRAVENOUS
  Administered 2022-02-08: 70 mg via INTRAVENOUS

## 2022-02-08 MED ORDER — LIDOCAINE HCL (CARDIAC) PF 100 MG/5ML IV SOSY
PREFILLED_SYRINGE | INTRAVENOUS | Status: DC | PRN
  Administered 2022-02-08: 100 mg via INTRAVENOUS

## 2022-02-08 NOTE — Anesthesia Procedure Notes (Signed)
Procedure Name: General with mask airway Date/Time: 02/08/2022 8:22 AM  Performed by: Kelton Pillar, CRNAPre-anesthesia Checklist: Patient identified, Emergency Drugs available, Suction available and Patient being monitored Patient Re-evaluated:Patient Re-evaluated prior to induction Oxygen Delivery Method: Simple face mask Induction Type: IV induction Placement Confirmation: positive ETCO2, CO2 detector and breath sounds checked- equal and bilateral Dental Injury: Teeth and Oropharynx as per pre-operative assessment

## 2022-02-08 NOTE — Transfer of Care (Signed)
Immediate Anesthesia Transfer of Care Note  Patient: Luis Joseph  Procedure(s) Performed: COLONOSCOPY WITH PROPOFOL  Patient Location: Endoscopy Unit  Anesthesia Type:General  Level of Consciousness: drowsy and patient cooperative  Airway & Oxygen Therapy: Patient Spontanous Breathing and Patient connected to face mask oxygen  Post-op Assessment: Report given to RN and Post -op Vital signs reviewed and stable  Post vital signs: Reviewed and stable  Last Vitals:  Vitals Value Taken Time  BP 135/87 02/08/22 0836  Temp    Pulse 77 02/08/22 0836  Resp 12 02/08/22 0836  SpO2 99 % 02/08/22 0836    Last Pain:  Vitals:   02/08/22 0836  TempSrc:   PainSc: Asleep         Complications: No notable events documented.
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
## 2022-02-08 NOTE — Op Note (Signed)
[facility] Patient Name: Luis Joseph Procedure Date: 02/08/2022 8:15 AM MRN: [id number] Account #: [id number] Date of Birth: 12-23-1955 Admit Type: Outpatient Age: 66 Room: [facility] ENDO ROOM 3 Gender: Male Note Status: Finalized Instrument Name: Jasper Riling [id number] Procedure:             Colonoscopy Indications:           Screening for colorectal malignant neoplasm Providers:             [name] MD, MD Referring MD:          Elveria Rising. Damita Dunnings, MD (Referring MD) Medicines:             Monitored Anesthesia Care Complications:         No immediate complications. Procedure:             Pre-Anesthesia Assessment:                        - Prior to the procedure, a History and Physical was                         performed, and patient medications, allergies and                         sensitivities were reviewed. The patient's tolerance                         of previous anesthesia was reviewed.                        - ASA Grade Assessment: II - A patient with mild                         systemic disease.                        After obtaining informed consent, the colonoscope was                         passed under direct vision. Throughout the procedure,                         the patient's blood pressure, pulse, and oxygen                         saturations were monitored continuously. The                         Colonoscope was introduced through the anus and                         advanced to the the cecum, identified by the                         appendiceal orifice. The colonoscopy was performed                         with ease. The patient tolerated the procedure well.  The quality of the bowel preparation was poor. Findings:      The perianal and digital rectal examinations were normal.      A large amount of semi-liquid stool was found in the entire colon,       interfering with visualization. Impression:             - Preparation of the colon was poor.                        - Stool in the entire examined colon.                        - No specimens collected. Recommendation:        - Discharge patient to home (with escort).                        - Resume previous diet.                        - Continue present medications.                        - Repeat colonoscopy in 1 month for screening purposes                         and because the bowel preparation was suboptimal. Procedure Code(s):     --- Professional ---                        [phone], Colonoscopy, flexible; diagnostic, including                         collection of specimen(s) by brushing or washing, when                         performed (separate procedure) Diagnosis Code(s):     --- Professional ---                        Z12.11, Encounter for screening for malignant neoplasm                         of colon CPT copyright 2019 American Medical Association. All rights reserved. The codes documented in this report are preliminary and upon coder review may  be revised to meet current compliance requirements. [name], MD [name] MD, MD 02/08/2022 8:33:53 AM This report has been signed electronically. Number of Addenda: 0 Note Initiated On: 02/08/2022 8:15 AM Scope Withdrawal Time: 0 hours 3 minutes 21 seconds  Total Procedure Duration: 0 hours 6 minutes 46 seconds  Estimated Blood Loss:  Estimated blood loss: none.      [facility]

## 2022-02-08 NOTE — Anesthesia Postprocedure Evaluation (Signed)
Anesthesia Post Note  Patient: Luis Joseph  Procedure(s) Performed: COLONOSCOPY WITH PROPOFOL  Patient location during evaluation: PACU Anesthesia Type: General Level of consciousness: awake and oriented Pain management: pain level controlled Respiratory status: spontaneous breathing and respiratory function stable Cardiovascular status: stable Anesthetic complications: no   No notable events documented.   Last Vitals:  Vitals:   02/08/22 0845 02/08/22 0857  BP: (!) 145/94 (!) 156/98  Pulse: 71 72  Resp: 13 20  Temp:    SpO2: 97% 100%    Last Pain:  Vitals:   02/08/22 0857  TempSrc:   PainSc: 0-No pain                 VAN STAVEREN,Andalyn Heckstall

## 2022-02-08 NOTE — Anesthesia Preprocedure Evaluation (Signed)
Anesthesia Evaluation  Patient identified by MRN, date of birth, ID band Patient awake    Reviewed: Allergy & Precautions, NPO status , Patient's Chart, lab work & pertinent test results  Airway Mallampati: III  TM Distance: >3 FB     Dental  (+) Teeth Intact   Pulmonary Patient abstained from smoking., former smoker,     + decreased breath sounds      Cardiovascular Exercise Tolerance: Good hypertension, Pt. on medications  Rhythm:Regular Rate:Normal     Neuro/Psych  Headaches, negative psych ROS   GI/Hepatic GERD  Medicated,  Endo/Other  diabetes, Well Controlled, Type 2, Oral Hypoglycemic Agents  Renal/GU Renal disease     Musculoskeletal  (+) Arthritis ,   Abdominal (+) + obese,   Peds negative pediatric ROS (+)  Hematology  (+) Blood dyscrasia, anemia ,   Anesthesia Other Findings   Reproductive/Obstetrics                             Anesthesia Physical Anesthesia Plan  ASA: 3  Anesthesia Plan: General   Post-op Pain Management:    Induction: Intravenous  PONV Risk Score and Plan:   Airway Management Planned: Natural Airway  Additional Equipment:   Intra-op Plan:   Post-operative Plan:   Informed Consent: I have reviewed the patients History and Physical, chart, labs and discussed the procedure including the risks, benefits and alternatives for the proposed anesthesia with the patient or authorized representative who has indicated his/her understanding and acceptance.       Plan Discussed with: CRNA and Surgeon  Anesthesia Plan Comments:         Anesthesia Quick Evaluation

## 2022-02-09 ENCOUNTER — Encounter: Payer: Self-pay | Admitting: Gastroenterology

## 2022-02-17 DIAGNOSIS — E1122 Type 2 diabetes mellitus with diabetic chronic kidney disease: Secondary | ICD-10-CM | POA: Diagnosis not present

## 2022-02-17 DIAGNOSIS — I129 Hypertensive chronic kidney disease with stage 1 through stage 4 chronic kidney disease, or unspecified chronic kidney disease: Secondary | ICD-10-CM

## 2022-02-17 DIAGNOSIS — E785 Hyperlipidemia, unspecified: Secondary | ICD-10-CM | POA: Diagnosis not present

## 2022-02-17 DIAGNOSIS — N184 Chronic kidney disease, stage 4 (severe): Secondary | ICD-10-CM

## 2022-02-17 DIAGNOSIS — Z7985 Long-term (current) use of injectable non-insulin antidiabetic drugs: Secondary | ICD-10-CM

## 2022-03-23 ENCOUNTER — Other Ambulatory Visit: Payer: Self-pay | Admitting: Family Medicine

## 2022-03-23 NOTE — Telephone Encounter (Signed)
Refill request Ozempic Last refill 09/19/21  3 ml/5 refills Last office visit 10/26/21  Upcoming appointment 05/24/22

## 2022-04-05 ENCOUNTER — Ambulatory Visit: Payer: Medicare Other | Admitting: Urology

## 2022-05-18 ENCOUNTER — Telehealth: Payer: Self-pay

## 2022-05-18 LAB — LIPID PANEL
Chol/HDL Ratio: 3.9 ratio (ref 0.0–5.0)
Cholesterol, Total: 198 mg/dL (ref 100–199)
HDL: 51 mg/dL (ref >39)
LDL Chol Calc (NIH): 115 mg/dL — ABNORMAL HIGH (ref 0–99)
Triglycerides: 184 mg/dL — ABNORMAL HIGH (ref 0–149)
VLDL Cholesterol Cal: 32 mg/dL (ref 5–40)

## 2022-05-18 LAB — COMPREHENSIVE METABOLIC PANEL
ALT: 33 IU/L (ref 0–44)
AST: 28 IU/L (ref 0–40)
Albumin/Globulin Ratio: 1.3 (ref 1.2–2.2)
Albumin: 3.6 g/dL — ABNORMAL LOW (ref 3.9–4.9)
Alkaline Phosphatase: 138 IU/L — ABNORMAL HIGH (ref 44–121)
BUN/Creatinine Ratio: 13 (ref 10–24)
BUN: 31 mg/dL — ABNORMAL HIGH (ref 8–27)
Bilirubin Total: 0.4 mg/dL (ref 0.0–1.2)
CO2: 17 mmol/L — ABNORMAL LOW (ref 20–29)
Calcium: 9 mg/dL (ref 8.6–10.2)
Chloride: 106 mmol/L (ref 96–106)
Creatinine, Ser: 2.47 mg/dL — ABNORMAL HIGH (ref 0.76–1.27)
Globulin, Total: 2.7 g/dL (ref 1.5–4.5)
Glucose: 162 mg/dL — ABNORMAL HIGH (ref 70–99)
Potassium: 4.3 mmol/L (ref 3.5–5.2)
Sodium: 139 mmol/L (ref 134–144)
Total Protein: 6.3 g/dL (ref 6.0–8.5)
eGFR: 28 mL/min/{1.73_m2} — ABNORMAL LOW (ref >59)

## 2022-05-18 LAB — URIC ACID: Uric Acid: 4.6 mg/dL (ref 3.8–8.4)

## 2022-05-18 NOTE — Telephone Encounter (Signed)
Patient called regarding labs.  Asked patient if he had changed his diet as LDL was slightly elevated, he endorses that he has been eating "not as healthy as I normally do due to the holidays". He states he has been taking his zetia daily as ordered.

## 2022-05-23 ENCOUNTER — Telehealth: Payer: Self-pay

## 2022-05-23 DIAGNOSIS — E782 Mixed hyperlipidemia: Secondary | ICD-10-CM

## 2022-05-23 DIAGNOSIS — M791 Myalgia, unspecified site: Secondary | ICD-10-CM

## 2022-05-23 DIAGNOSIS — I700 Atherosclerosis of aorta: Secondary | ICD-10-CM

## 2022-05-23 NOTE — Telephone Encounter (Signed)
The patient has been notified of the result and verbalized understanding.  All questions (if any) were answered. Jd Mccaster N Gurdeep Keesey, RN 05/23/2022 2:16 PM   Pt reports takes Zetia 10 mg PO QD as ordered. Advised a scheduler will call to set up lipid clinic OV.

## 2022-05-23 NOTE — Telephone Encounter (Signed)
-----   Message from Werner Lean, MD sent at 05/21/2022 10:04 AM EST ----- Results: LDL above goal Plan: Offer lipid clinic referral   Werner Lean, MD

## 2022-05-24 ENCOUNTER — Encounter: Payer: Self-pay | Admitting: Family Medicine

## 2022-05-24 ENCOUNTER — Ambulatory Visit (INDEPENDENT_AMBULATORY_CARE_PROVIDER_SITE_OTHER): Payer: Medicare Other | Admitting: Family Medicine

## 2022-05-24 VITALS — BP 122/80 | HR 75 | Temp 97.3°F | Ht 71.0 in | Wt 230.0 lb

## 2022-05-24 DIAGNOSIS — E1122 Type 2 diabetes mellitus with diabetic chronic kidney disease: Secondary | ICD-10-CM | POA: Diagnosis not present

## 2022-05-24 LAB — POCT GLYCOSYLATED HEMOGLOBIN (HGB A1C): Hemoglobin A1C: 7.3 % — AB (ref 4.0–5.6)

## 2022-05-24 MED ORDER — SEMAGLUTIDE (2 MG/DOSE) 8 MG/3ML ~~LOC~~ SOPN: 2.0000 mg | PEN_INJECTOR | SUBCUTANEOUS | 5 refills | Status: DC

## 2022-05-24 NOTE — Patient Instructions (Addendum)
Try the higher dose of ozempic.  Recheck in 3-6 months.  Labs at the visit.  If you don't tolerate the higher dose then let me know.  Take care.  Glad to see you.

## 2022-05-24 NOTE — Progress Notes (Signed)
Diabetes:  Using medications without difficulties: yes Hypoglycemic episodes: no Hyperglycemic episodes: no Feet problems:no Blood Sugars averaging: usually ~130 in the AM, higher after lunch.  D/w pt about diet at lunch. eye exam within last year: On farxiga and ozempic.  He has renal f/u pending.   He has some episodic diarrhea but not consistently.  Cautions d/w pt.    A1c d/w pt at OV.  7.3.  D/w pt about f/u with GI about colonoscopy.  Info given to patient.    D/w pt about lipid clinic referral.  He is waiting to hear about the appointment.    Meds, vitals, and allergies reviewed.   ROS: Per HPI unless specifically indicated in ROS section   GEN: nad, alert and oriented HEENT: ncat NECK: supple w/o LA CV: rrr. PULM: ctab, no inc wob ABD: soft, +bs EXT: no edema SKIN: well perfused.

## 2022-05-26 NOTE — Assessment & Plan Note (Signed)
Continue work on diet and exercise.  Continue Farxiga. Increase Ozempic to 2 mg weekly.  Recheck in 3-6 months.  Labs at the visit.  He can update me if he does not tolerate the higher dose of Ozempic in the meantime.  Routine cautions given to patient.

## 2022-07-31 ENCOUNTER — Telehealth: Payer: Medicare Other | Admitting: Family Medicine

## 2022-07-31 DIAGNOSIS — R059 Cough, unspecified: Secondary | ICD-10-CM

## 2022-07-31 NOTE — Progress Notes (Signed)
Luis Joseph   [place]- advised to follow up at local UC  Patient acknowledged agreement and understanding of the plan.

## 2022-08-06 ENCOUNTER — Telehealth: Payer: Medicare Other

## 2022-08-07 ENCOUNTER — Ambulatory Visit: Payer: Medicare Other | Attending: Cardiology | Admitting: Pharmacist

## 2022-08-07 ENCOUNTER — Encounter: Payer: Self-pay | Admitting: Pharmacist

## 2022-08-07 DIAGNOSIS — E782 Mixed hyperlipidemia: Secondary | ICD-10-CM | POA: Diagnosis present

## 2022-08-07 MED ORDER — ROSUVASTATIN CALCIUM 10 MG PO TABS: 10.0000 mg | ORAL_TABLET | Freq: Every day | ORAL | 3 refills | Status: DC

## 2022-08-07 NOTE — Assessment & Plan Note (Addendum)
Assessment:  Patient reported taking ezetimibe 10mg   Reviewed options for additional LDL lowering including a lower dose statin, PCSK-9 inhibitor, bempedoic acid and inclisirin  Discussed how renal function could have resulted in some accumulation with rosuvastatin 20mg  (max dose 10mg  when crcl is <30) Patient is amenable to rosuvastatin 10mg    Plan:  Start rosuvastatin 10mg   Continue ezetimibe 10mg   Encouraged patient to increase exercise to help with LDL lowering and overall CV health  Will plan to follow-up after updated labs in June

## 2022-08-07 NOTE — Progress Notes (Signed)
Patient ID: Luis Joseph                 DOB: 04/27/1956                    MRN: [id number]      HPI: Luis Joseph is a 67 y.o. male patient referred to lipid clinic by Dr. Owens Shark. PMH is significant for HTN, HLD and aortic atherosclerosis, CKD Stage IIIa, diabetes   At visit with Dr. Owens Shark on 08/09/2021 patient was continued on ezetimibe 10mg . Following labs on 09/28/2021, ezetimibe was switched to Nexlizet. Patient reported not being able to afford it ([facility] coupon $169/mo). Patient reported taking ezetimibe 10mg . Most recent LDL-C from 05/17/2022 is 115, TG: 184  At today's visit, reviewed options for lowering LDL cholesterol including a lower dose statin, PCSK-9 inhibitors, bempedoic acid and inclisiran. Patient reports not being as active lately due to being sick (flu and bronchitis). Patient reported wanting to try rosuvastatin 10mg  in addition to ezetimibe and follow up with Dr. Owens Shark in July.   Current Medications: ezetimibe 10mg   Intolerances: rosuvastatin 20mg  (myalgias), simvastatin 40mg  (myalgias) Risk Factors: diabetes, aortic atherosclerosis  LDL-C goal: < 70 ApoB goal: < 80  Diet: did not discuss   Exercise: not much exercise at this time. Patient has been sick with the flu and bronchitis.   Family History: The patient's family history includes Cancer in his father; Diabetes in his maternal grandfather, mother, and sister; Heart disease in his maternal grandfather and mother; Hypertension in his mother. There is no history of Stroke, Prostate cancer, or Colon cancer.   Social History: Former smoker. 1 pack/day for 6 years. Quit 11/17/1977. No alcohol, No drug use.   Labs: Lipid Panel     Component Value Date/Time   CHOL 198 05/17/2022 0939   TRIG 184 (H) 05/17/2022 0939   HDL 51 05/17/2022 0939   CHOLHDL 3.9 05/17/2022 0939   CHOLHDL 5.4 (H) 02/09/2021 1124   VLDL 57.6 (H) 03/22/2016 0846   LDLCALC 115 (H) 05/17/2022 0939   LDLCALC 144 (H)  02/09/2021 1124   LDLDIRECT 127.0 03/22/2016 0846   LABVLDL 32 05/17/2022 0939    Past Medical History:  Diagnosis Date   Allergic rhinitis    Anemia in chronic kidney disease    CKD (chronic kidney disease), stage III ([place])    NEPHROLOGIST-  DR Lavonia Dana   Diabetes mellitus, type 2 ([place])    diet controlled   GERD (gastroesophageal reflux disease) 05/21/1978   Gout 05/21/1985   Hematuria    History of kidney stones    Hydronephrosis, right    Hyperlipidemia 05/21/1988   Hyperparathyroidism, secondary renal ([place])    Hypertension 05/21/1978   Migraines    Better after gastric bypass   Renal cyst, left     Current Outpatient Medications on File Prior to Visit  Medication Sig Dispense Refill   allopurinol (ZYLOPRIM) 300 MG tablet TAKE 1 TABLET BY MOUTH EVERY DAY 90 tablet 1   amLODipine (NORVASC) 2.5 MG tablet Take 1 tablet (2.5 mg total) by mouth daily. 90 tablet 3   B-D UF III MINI PEN NEEDLES 31G X 5 MM MISC USE DAILY WITH INSULIN INJECTION 100 each 1   clotrimazole-betamethasone (LOTRISONE) cream APPLY TO AFFECTED AREA TWICE A DAY 45 g 1   Continuous Blood Gluc Sensor (FREESTYLE LIBRE 2 SENSOR) MISC Apply sensor every 14 days to monitor sugar continously 2 each 5   dapagliflozin propanediol (FARXIGA) 10 MG  TABS tablet Take 1 tablet (10 mg total) by mouth daily before breakfast.     ezetimibe (ZETIA) 10 MG tablet TAKE 1 TABLET BY MOUTH EVERY DAY 90 tablet 2   furosemide (LASIX) 20 MG tablet TAKE 1 TABLET BY MOUTH EVERY DAY 90 tablet 2   metoprolol succinate (TOPROL-XL) 100 MG 24 hr tablet TAKE 1 TABLET BY MOUTH EVERY DAY WITH OR IMMEDIATELY FOLLOWING A MEAL 90 tablet 2   metoprolol succinate (TOPROL-XL) 25 MG 24 hr tablet Take 25 mg by mouth 2 (two) times daily.     nitroGLYCERIN (NITROSTAT) 0.4 MG SL tablet Place 1 tablet (0.4 mg total) under the tongue every 5 (five) minutes as needed for chest pain. 50 tablet 3   Semaglutide, 2 MG/DOSE, 8 MG/3ML SOPN Inject 2 mg as  directed once a week. 3 mL 5   telmisartan (MICARDIS) 80 MG tablet TAKE 1 TABLET BY MOUTH 1 TIME EACH DAY. 90 tablet 1   No current facility-administered medications on file prior to visit.    Allergies  Allergen Reactions   Codeine Sulfate Hives and Swelling   Sildenafil Other (See Comments)    flushing   Crestor [Rosuvastatin]     Myalgias.    Nsaids     CKD    Assessment/Plan:  1. Hyperlipidemia -  Mixed hyperlipidemia Assessment:  Patient reported taking ezetimibe 10mg   Reviewed options for additional LDL lowering including a lower dose statin, PCSK-9 inhibitor, bempedoic acid and inclisirin  Discussed how renal function could have resulted in some accumulation with rosuvastatin 20mg  (max dose 10mg  when crcl is <30) Patient is amenable to rosuvastatin 10mg    Plan:  Start rosuvastatin 10mg   Continue ezetimibe 10mg   Encouraged patient to increase exercise to help with LDL lowering and overall CV health  Will plan to follow-up after updated labs in June   Thank you,  Wallene Huh, PharmD Candidate   Ramond Dial, Pharm.D, BCPS, CPP [facility] [address], [place], [place] [address]  Phone: [phone]; Fax: [phone]

## 2022-09-08 ENCOUNTER — Other Ambulatory Visit: Payer: Self-pay | Admitting: Family Medicine

## 2022-09-30 ENCOUNTER — Other Ambulatory Visit: Payer: Self-pay | Admitting: Family Medicine

## 2022-10-22 ENCOUNTER — Telehealth: Payer: Self-pay | Admitting: Family Medicine

## 2022-10-22 DIAGNOSIS — E1122 Type 2 diabetes mellitus with diabetic chronic kidney disease: Secondary | ICD-10-CM

## 2022-10-22 DIAGNOSIS — E1165 Type 2 diabetes mellitus with hyperglycemia: Secondary | ICD-10-CM

## 2022-10-22 NOTE — Telephone Encounter (Signed)
Patient states that both [facility] [phone]) and [facility] [phone]) have been contacting him referencing needing "chart notes" to send him more CGM devices. Currently has Freestyle [place] 2, but has been talking about Josephine Igo 3 (not sure if Lillia Abed can help get that  Patient states that he has 3 days left on his current CGM and then will not be able to get more  Please contact the companies above and find out what he needs

## 2022-10-22 NOTE — Telephone Encounter (Signed)
Unfortunately he is not a [facility] patient so I cannot see him.  Please enter [phone] for pharmacy assistance. Routing to [facility] PharmD as well.  In the meantime, he may be able to get Freestyle [place] sensors (version 2 or 3, whichever he prefers) at his regular pharmacy this year, as insurance regulations may have changed in 2024. Recommend to send eRx for sensors to his pharmacy to check price - it may be same or less than DME ([facility]).

## 2022-10-23 ENCOUNTER — Other Ambulatory Visit: Payer: Self-pay | Admitting: Family Medicine

## 2022-10-23 DIAGNOSIS — E1165 Type 2 diabetes mellitus with hyperglycemia: Secondary | ICD-10-CM

## 2022-10-23 MED ORDER — FREESTYLE LIBRE 2 SENSOR MISC: 5 refills | Status: AC

## 2022-10-23 NOTE — Telephone Encounter (Signed)
I put in the referral and sent the rx.  Please update patient.  See below.  Thanks.

## 2022-10-23 NOTE — Telephone Encounter (Signed)
Per pharmacy note:CGM ARE ONLY COVERED BY MEDICARE FOR PTS ON INSULIN, NO RECORD OF INSULIN ON HIS PROFILE... HIS [place] INSUR WON'T COVER EITHER.   Tried to call patient but no answer; lmtcb.

## 2022-10-23 NOTE — Telephone Encounter (Signed)
Patient returned call and I discussed below with him. Pharmacy states he's not on insulin but was in the past; not sure if that makes a difference or not?

## 2022-10-24 ENCOUNTER — Telehealth: Payer: Self-pay

## 2022-10-24 ENCOUNTER — Other Ambulatory Visit: Payer: Self-pay | Admitting: Pharmacist

## 2022-10-24 LAB — MICROALBUMIN, URINE: Microalb, Ur: 328.9

## 2022-10-24 LAB — BASIC METABOLIC PANEL: Creatinine: 2.9 — AB (ref 0.6–1.3)

## 2022-10-24 LAB — MICROALBUMIN / CREATININE URINE RATIO: Microalb Creat Ratio: 5894

## 2022-10-24 NOTE — Telephone Encounter (Signed)
I will reach out to patient. Thanks!

## 2022-10-24 NOTE — Progress Notes (Signed)
Care Coordination Call  Received question from care guide regarding [employer] access. Appointment scheduled June 24th. Notified patient that Medicare A/B covers CGM if patient is on insulin therapy.   Catie Eppie Gibson, PharmD, BCACP, CPP [facility] [phone]

## 2022-10-24 NOTE — Progress Notes (Signed)
   Care Guide Note  10/24/2022 Name: Luis Joseph MRN: [id number] DOB: 01/06/1956  Referred by: Joaquim Nam, MD Reason for referral : Care Coordination (Outreach to schedule with Pharm d )   Luis Joseph is a 67 y.o. year old male who is a primary care patient of Joaquim Nam, MD. Luis Joseph was referred to the pharmacist for assistance related to DM.    Successful contact was made with the patient to discuss pharmacy services including being ready for the pharmacist to call at least 5 minutes before the scheduled appointment time, to have medication bottles and any blood sugar or blood pressure readings ready for review. The patient agreed to meet with the pharmacist via with the pharmacist via telephone visit on (date/time).  11/12/2022   Penne Lash, RMA Care Guide [facility]  [place], [place] [address] Direct Dial: [phone] [email or web address]

## 2022-11-01 ENCOUNTER — Encounter: Payer: Self-pay | Admitting: Family Medicine

## 2022-11-01 ENCOUNTER — Ambulatory Visit (INDEPENDENT_AMBULATORY_CARE_PROVIDER_SITE_OTHER): Payer: Medicare Other | Admitting: Family Medicine

## 2022-11-01 VITALS — BP 118/84 | HR 83 | Temp 97.5°F | Ht 71.0 in | Wt 214.0 lb

## 2022-11-01 DIAGNOSIS — Z7985 Long-term (current) use of injectable non-insulin antidiabetic drugs: Secondary | ICD-10-CM | POA: Diagnosis not present

## 2022-11-01 DIAGNOSIS — E875 Hyperkalemia: Secondary | ICD-10-CM | POA: Insufficient documentation

## 2022-11-01 DIAGNOSIS — E1122 Type 2 diabetes mellitus with diabetic chronic kidney disease: Secondary | ICD-10-CM | POA: Diagnosis not present

## 2022-11-01 LAB — POCT GLYCOSYLATED HEMOGLOBIN (HGB A1C): Hemoglobin A1C: 7.2 % — AB (ref 4.0–5.6)

## 2022-11-01 NOTE — Progress Notes (Signed)
Diabetes:  Using medications without difficulties: he has diarrhea about once per week but he can manage it- he attributed it to diet.   Hypoglycemic episodes: once at night, corrected with a snack.  Cautions d/w pt.  Episode was asymptomatic.   Hyperglycemic episodes: no Feet problems: no Blood Sugars averaging: usually ~140.   eye exam within last year: pending, a few weeks from now.   He has tolerated crestor and zetia in the meantime.   A1c done at OV.   He is down 16 lbs from last OV, intentional weight loss.    He has libre 2 but isn't able to get it covered in the meantime.  He is going to follow up with pharmacy but may change back to a regular meter.  He is able to follow his diet- he is aware about limiting carbs.  I asked for pharmacy staff input re: meter.   Meds, vitals, and allergies reviewed.   ROS: Per HPI unless specifically indicated in ROS section   GEN: nad, alert and oriented HEENT: mucous membranes moist NECK: supple w/o LA CV: rrr. PULM: ctab, no inc wob ABD: soft, +bs EXT: no edema SKIN: no acute rash, benign appearing but irritated 4mm lesion on the L thigh- noted in the last week- d/w pt about observation.    30 minutes were devoted to patient care in this encounter (this includes time spent reviewing the patient's file/history, interviewing and examining the patient, counseling/reviewing plan with patient).

## 2022-11-01 NOTE — Patient Instructions (Addendum)
Check to see what meter is covered.  Plan on recheck on the same day as the kidney clinic in about 3-6 months.  Let me know when you are going to get labs for renal later this year.   If you have any more low sugars, then let me know.   Take care.  Glad to see you.

## 2022-11-03 ENCOUNTER — Encounter: Payer: Self-pay | Admitting: Family Medicine

## 2022-11-04 NOTE — Assessment & Plan Note (Signed)
Discussed with patient about cautions regarding hypoglycemia. A1c done at OV.   He is down 16 lbs from last OV, intentional weight loss.    He has libre 2 but isn't able to get it covered in the meantime.  He is going to follow up with pharmacy but may change back to a regular meter.  He is able to follow his diet- he is aware about limiting carbs.  I asked for pharmacy staff input re: meter.   Recheck in 3-6 months.  Continue Farxiga and semaglutide in the meantime.

## 2022-11-06 MED ORDER — CLOTRIMAZOLE-BETAMETHASONE 1-0.05 % EX CREA: TOPICAL_CREAM | CUTANEOUS | 1 refills | Status: AC

## 2022-11-06 NOTE — Addendum Note (Signed)
Addended by: Wendie Simmer B on: 11/06/2022 08:54 AM   Modules accepted: Orders

## 2022-11-12 ENCOUNTER — Other Ambulatory Visit: Payer: Medicare Other | Admitting: Pharmacist

## 2022-11-12 NOTE — Patient Instructions (Signed)
Saketh,   Here is a new savings card for Ozempic. I have already called this information to your pharmacy:     I've submitted the [place] 3 order to [facility] and will let you know what I find out.   Thanks!  Catie Clearance Coots, PharmD [phone]

## 2022-11-12 NOTE — Progress Notes (Signed)
11/12/2022 Name: Luis Joseph MRN: [id number] DOB: 09-Feb-1956  Chief Complaint  Patient presents with   Medication Management   Diabetes    Luis Joseph is a 67 y.o. year old male who presented for a telephone visit.   They were referred to the pharmacist by their PCP for assistance in managing diabetes and medication access.    Subjective:  Care Team: Primary Care Provider: Joaquim Nam, MD ; Next Scheduled Visit: not scheduled  Medication Access/Adherence  [phone] [place] [address], [place] - [address] [place] [address] Phone: [phone] Fax: [phone]   Patient reports affordability concerns with their medications: Yes  Patient reports access/transportation concerns to their pharmacy: No  Patient reports adherence concerns with their medications:  No     Diabetes:  Current medications: Ozempic 2 mg weekly, Farxiga 10 mg daily Medications tried in the past: renal function inappropriate for metformin  Reports he has not received Libre in a while. Was receiving from [facility]. Previously on insulin therapy, but was able to discontinue due to benefit of current regimen with CGM monitoring.   Patient reports hypoglycemic s/sx including dizziness, shakiness, sweating. Has had episodes of hypoglycemia while not on insulin therapy.   Date of Download: 5/24-10/25/22 % Time CGM is active: 62% Average Glucose: 154 mg/dL Glucose Variability: 52.8 (goal <36%) Time in Goal:  - Time in range 70-180: 69% - Time above range: 30% - Time below range: 1%     Per Carlus Pavlov documentation:    Ozempic copay card obtained 08/23/21, and should be good for 24 months.  BIN [id number] PCN [id number] GRP [id number] ID [id number]  Confirmed Ozempic is being filled with insurance + copay card  Hypertension:  Current medications: prescribed telmisartan 80 mg daily but has not filled since 01/2022; also taking  amlodipine 2.5 mg daily  Visit with nephrology last week; eGFR 23; started on calcitriol  Hyperlipidemia/ASCVD Risk Reduction  Current lipid lowering medications: rosuvastatin 10 mg daily- recently added by cardiology PharmD; ezetimibe 10 mg daily Prior medications: rosuvastatin 20mg  (myalgias), simvastatin 40mg  (myalgias); unable to afford Nexlizet with copay card .  Objective:  Lab Results  Component Value Date   HGBA1C 7.2 (A) 11/01/2022    Lab Results  Component Value Date   CREATININE 2.9 (A) 10/24/2022   BUN 31 (H) 05/17/2022   NA 139 05/17/2022   K 4.3 05/17/2022   CL 106 05/17/2022   CO2 17 (L) 05/17/2022    Lab Results  Component Value Date   CHOL 198 05/17/2022   HDL 51 05/17/2022   LDLCALC 115 (H) 05/17/2022   LDLDIRECT 127.0 03/22/2016   TRIG 184 (H) 05/17/2022   CHOLHDL 3.9 05/17/2022    Medications Reviewed Today     Reviewed by Alden Hipp, RPH-CPP (Pharmacist) on 11/12/22 at 1546  Med List Status: <None>   Medication Order Taking? Sig Documenting Provider Last Dose Status Informant  allopurinol (ZYLOPRIM) 300 MG tablet [id number] Yes TAKE 1 TABLET BY MOUTH EVERY DAY Joaquim Nam, MD Taking Active   amLODipine (NORVASC) 2.5 MG tablet [id number] Yes Take 1 tablet (2.5 mg total) by mouth daily. Joaquim Nam, MD Taking Active   B-D UF III MINI PEN NEEDLES 31G X 5 MM MISC [id number]  USE DAILY WITH INSULIN INJECTION Joaquim Nam, MD  Active   calcitRIOL (ROCALTROL) 0.25 MCG capsule [id number] Yes Take 0.25 mcg by mouth 3 (three) times a  week. [provider]  Active   clotrimazole-betamethasone (LOTRISONE) cream [id number]  Apply to affected area twice a day as needed. Joaquim Nam, MD  Active   Continuous Glucose Sensor (FREESTYLE LIBRE 2 SENSOR) [place] [id number] No Apply sensor every 14 days to monitor sugar continously  Patient not taking: Reported on 11/12/2022   Joaquim Nam, MD Not Taking Active   dapagliflozin propanediol  (FARXIGA) 10 MG TABS tablet [id number] Yes Take 1 tablet (10 mg total) by mouth daily before breakfast. Joaquim Nam, MD Taking Active   ezetimibe (ZETIA) 10 MG tablet [id number] Yes TAKE 1 TABLET BY MOUTH EVERY DAY Chandrasekhar, Mahesh A, MD Taking Active   furosemide (LASIX) 20 MG tablet [id number] Yes TAKE 1 TABLET BY MOUTH EVERY DAY Chandrasekhar, Mahesh A, MD Taking Active   metoprolol succinate (TOPROL-XL) 100 MG 24 hr tablet [id number] Yes TAKE 1 TABLET BY MOUTH EVERY DAY WITH OR IMMEDIATELY FOLLOWING A MEAL Joaquim Nam, MD Taking Active   metoprolol succinate (TOPROL-XL) 25 MG 24 hr tablet [id number] Yes Take 25 mg by mouth 2 (two) times daily. [provider] Taking Active   nitroGLYCERIN (NITROSTAT) 0.4 MG SL tablet [id number]  Place 1 tablet (0.4 mg total) under the tongue every 5 (five) minutes as needed for chest pain. Joaquim Nam, MD  Active   rosuvastatin (CRESTOR) 10 MG tablet [id number] Yes Take 1 tablet (10 mg total) by mouth daily. Christell Constant, MD Taking Active   Semaglutide, 2 MG/DOSE, 8 MG/3ML SOPN [id number] Yes Inject 2 mg as directed once a week. Joaquim Nam, MD Taking Active   telmisartan (MICARDIS) 80 MG tablet [id number] Yes TAKE 1 TABLET BY MOUTH 1 TIME EACH DAY. Joaquim Nam, MD Taking Active Self              Assessment/Plan:   Diabetes: - Currently uncontrolled due to hypoglycemia.  - Given high risk for continued hypoglycemia due to renal function, recommend to continue CGM therapy. PA completed via Cover My Meds for [place] 3. Appears DexCom is preferred on [employer], but will try for [place] first for continuity of care. Cover My Meds noted that the order needed to be sent to a DME supplier for Medical billing. Completed order for Constantine 3 via [employer] to [facility]. Will follow.  - Patient reports continued cost concerns with Ozempic, [place]. Contacted pharmacy to ensure copay cards were being  used. Marcelline Deist card was being used and copay was $0. Ozempic card had expired. Assisted in downloading new savings card. Information provided to his pharmacy [facility]: [phone]; PC[place]; Group: [id number]; ID: [id number]) - Recommend to continue current regimen at this time  Hypertension: - Currently suboptimally treated due to accidental discontinuation of telmisartan - Contacted [facility]; left voicemail notifying Dr. Wynelle Link that telmisartan has not been taken for several months; see if they would like to restart + BMP due to low eGFR. Will await call back.  Hyperlipidemia/ASCVD Risk Reduction: - Currently uncontrolled.  - Recommend to follow up with cardiology next week (after rosuvastatin addition) as scheduled.   Follow Up Plan: phone call in 4 weeks  Catie TClearance Coots, PharmD, BCACP, CPP Clinical Pharmacist [facility] [phone]

## 2022-11-14 ENCOUNTER — Other Ambulatory Visit: Payer: Self-pay | Admitting: Family Medicine

## 2022-11-14 ENCOUNTER — Other Ambulatory Visit: Payer: Self-pay | Admitting: Pharmacist

## 2022-11-14 MED ORDER — TELMISARTAN 80 MG PO TABS: ORAL_TABLET | ORAL | 3 refills | Status: DC

## 2022-11-14 NOTE — Progress Notes (Signed)
Care Coordination Call  Received call back from [facility]. They do request that patient restarts telmisartan and do not feel that he needs sooner BMP.   Current prescription is expired, will collaborate with Dr. Para March to place refill.   Catie Eppie Gibson, PharmD, BCACP, CPP Clinical Pharmacist [facility] [phone]

## 2022-11-14 NOTE — Progress Notes (Signed)
Care Coordination Call  Per [facility] portal, patient approved for [place] 3 order. [employer] Tracking: [id number]   Will follow up.   Catie Eppie Gibson, PharmD, BCACP, CPP Clinical Pharmacist [facility] [phone]

## 2022-11-14 NOTE — Addendum Note (Signed)
Addended by: Nilda Simmer T on: 11/14/2022 02:53 PM   Modules accepted: Orders

## 2022-11-14 NOTE — Progress Notes (Signed)
Sent. Thanks.   

## 2022-11-21 ENCOUNTER — Ambulatory Visit: Payer: Medicare Other | Admitting: Internal Medicine

## 2022-11-23 ENCOUNTER — Other Ambulatory Visit: Payer: Self-pay | Admitting: Family Medicine

## 2022-11-25 NOTE — Telephone Encounter (Signed)
Sent. Thanks.   

## 2022-11-30 ENCOUNTER — Telehealth: Payer: Self-pay | Admitting: Family Medicine

## 2022-11-30 MED ORDER — DAPAGLIFLOZIN PROPANEDIOL 10 MG PO TABS: 10.0000 mg | ORAL_TABLET | Freq: Every day | ORAL | 2 refills | Status: AC

## 2022-11-30 NOTE — Telephone Encounter (Signed)
Erx sent

## 2022-11-30 NOTE — Telephone Encounter (Signed)
Prescription Request  11/30/2022  LOV: 11/01/2022  What is the name of the medication or equipment?  dapagliflozin propanediol (FARXIGA) 10 MG TABS tablet   Have you contacted your pharmacy to request a refill? Yes   Which pharmacy would you like this sent to?  [phone] [place] [address] [place] - [address] [place] [address] Phone: [phone] Fax: [phone]    Patient notified that their request is being sent to the clinical staff for review and that they should receive a response within 2 business days.   Please advise at Mobile [phone] (mobile)

## 2022-12-05 ENCOUNTER — Other Ambulatory Visit: Payer: Self-pay | Admitting: Internal Medicine

## 2022-12-06 ENCOUNTER — Telehealth: Payer: Self-pay | Admitting: Pharmacist

## 2022-12-06 NOTE — Progress Notes (Signed)
Attempted to contact patient to schedule follow up. Left HIPAA compliant message for patient to return my call at their convenience.   Per [employer] tracking, patient has received [place] 3 sensors from [place].  Reapplied for Ozempic savings card and [place] savings card is on file with the pharmacy. Refill history confirms he has refilled both of these, along with telmisartan.   Will send patient MyChart message as well.   Catie Eppie Gibson, PharmD, BCACP, CPP Clinical Pharmacist [facility] [phone]

## 2022-12-07 ENCOUNTER — Encounter: Payer: Self-pay | Admitting: Pharmacist

## 2022-12-11 ENCOUNTER — Other Ambulatory Visit: Payer: Self-pay | Admitting: Pharmacist

## 2022-12-11 NOTE — Progress Notes (Signed)
Care Coordination Call  Received call from patient that he still had a copay for Ozempic and [place]. Contacted pharmacy. They did correctly apply his coupons, it just appears he has a high deductible to meet, and the copay cards took off their maximum amount.   Called patient to discuss. Left voicemail for him to return my call at his convenience.   Catie Eppie Gibson, PharmD, BCACP, CPP Clinical Pharmacist [facility] [phone]

## 2022-12-31 ENCOUNTER — Ambulatory Visit (INDEPENDENT_AMBULATORY_CARE_PROVIDER_SITE_OTHER): Payer: Medicare Other

## 2022-12-31 VITALS — Ht 71.0 in | Wt 205.0 lb

## 2022-12-31 DIAGNOSIS — Z1211 Encounter for screening for malignant neoplasm of colon: Secondary | ICD-10-CM

## 2022-12-31 DIAGNOSIS — Z0000 Encounter for general adult medical examination without abnormal findings: Secondary | ICD-10-CM
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
## 2022-12-31 NOTE — Patient Instructions (Signed)
Luis Joseph , Thank you for taking time to come for your Medicare Wellness Visit. I appreciate your ongoing commitment to your health goals. Please review the following plan we discussed and let me know if I can assist you in the future.   Referrals/Orders/Follow-Ups/Clinician Recommendations: Aim for 30 minutes of exercise or brisk walking, 6-8 glasses of water, and 5 servings of fruits and vegetables each day.   This is a list of the screening recommended for you and due dates:  Health Maintenance  Topic Date Due   Pneumonia Vaccine (2 of 2 - PCV) 12/31/2020   DTaP/Tdap/Td vaccine (3 - Td or Tdap) 09/19/2021   COVID-19 Vaccine (5 - 2023-24 season) 01/19/2022   Zoster (Shingles) Vaccine (2 of 2) 05/17/2022   Eye exam for diabetics  08/09/2022   Medicare Annual Wellness Visit  12/28/2022   Flu Shot  12/20/2022   Colon Cancer Screening  02/09/2023   Hemoglobin A1C  05/03/2023   Yearly kidney function blood test for diabetes  10/24/2023   Yearly kidney health urinalysis for diabetes  10/24/2023   Complete foot exam   11/01/2023   Hepatitis C Screening  Completed   HPV Vaccine  Aged Out    Advanced directives: (Declined) Advance directive discussed with you today. Even though you declined this today, please call our office should you change your mind, and we can give you the proper paperwork for you to fill out.  Next Medicare Annual Wellness Visit scheduled for next year: Yes  Preventive Care 25 Years and Older, Male  Preventive care refers to lifestyle choices and visits with your health care provider that can promote health and wellness. What does preventive care include? A yearly physical exam. This is also called an annual well check. Dental exams once or twice a year. Routine eye exams. Ask your health care provider how often you should have your eyes checked. Personal lifestyle choices, including: Daily care of your teeth and gums. Regular physical activity. Eating a healthy  diet. Avoiding tobacco and drug use. Limiting alcohol use. Practicing safe sex. Taking low doses of aspirin every day. Taking vitamin and mineral supplements as recommended by your health care provider. What happens during an annual well check? The services and screenings done by your health care provider during your annual well check will depend on your age, overall health, lifestyle risk factors, and family history of disease. Counseling  Your health care provider may ask you questions about your: Alcohol use. Tobacco use. Drug use. Emotional well-being. Home and relationship well-being. Sexual activity. Eating habits. History of falls. Memory and ability to understand (cognition). Work and work Astronomer. Screening  You may have the following tests or measurements: Height, weight, and BMI. Blood pressure. Lipid and cholesterol levels. These may be checked every 5 years, or more frequently if you are over 45 years old. Skin check. Lung cancer screening. You may have this screening every year starting at age 42 if you have a 30-pack-year history of smoking and currently smoke or have quit within the past 15 years. Fecal occult blood test (FOBT) of the stool. You may have this test every year starting at age 74. Flexible sigmoidoscopy or colonoscopy. You may have a sigmoidoscopy every 5 years or a colonoscopy every 10 years starting at age 39. Prostate cancer screening. Recommendations will vary depending on your family history and other risks. Hepatitis C blood test. Hepatitis B blood test. Sexually transmitted disease (STD) testing. Diabetes screening. This is done by checking  your blood sugar (glucose) after you have not eaten for a while (fasting). You may have this done every 1-3 years. Abdominal aortic aneurysm (AAA) screening. You may need this if you are a current or former smoker. Osteoporosis. You may be screened starting at age 65 if you are at high risk. Talk with  your health care provider about your test results, treatment options, and if necessary, the need for more tests. Vaccines  Your health care provider may recommend certain vaccines, such as: Influenza vaccine. This is recommended every year. Tetanus, diphtheria, and acellular pertussis (Tdap, Td) vaccine. You may need a Td booster every 10 years. Zoster vaccine. You may need this after age 57. Pneumococcal 13-valent conjugate (PCV13) vaccine. One dose is recommended after age 79. Pneumococcal polysaccharide (PPSV23) vaccine. One dose is recommended after age 47. Talk to your health care provider about which screenings and vaccines you need and how often you need them. This information is not intended to replace advice given to you by your health care provider. Make sure you discuss any questions you have with your health care provider. Document Released: 06/03/2015 Document Revised: 01/25/2016 Document Reviewed: 03/08/2015 Elsevier Interactive Patient Education  2017 [employer].  Fall Prevention in the Home Falls can cause injuries. They can happen to people of all ages. There are many things you can do to make your home safe and to help prevent falls. What can I do on the outside of my home? Regularly fix the edges of walkways and driveways and fix any cracks. Remove anything that might make you trip as you walk through a door, such as a raised step or threshold. Trim any bushes or trees on the path to your home. Use bright outdoor lighting. Clear any walking paths of anything that might make someone trip, such as rocks or tools. Regularly check to see if handrails are loose or broken. Make sure that both sides of any steps have handrails. Any raised decks and porches should have guardrails on the edges. Have any leaves, snow, or ice cleared regularly. Use sand or salt on walking paths during winter. Clean up any spills in your garage right away. This includes oil or grease spills. What  can I do in the bathroom? Use night lights. Install grab bars by the toilet and in the tub and shower. Do not use towel bars as grab bars. Use non-skid mats or decals in the tub or shower. If you need to sit down in the shower, use a plastic, non-slip stool. Keep the floor dry. Clean up any water that spills on the floor as soon as it happens. Remove soap buildup in the tub or shower regularly. Attach bath mats securely with double-sided non-slip rug tape. Do not have throw rugs and other things on the floor that can make you trip. What can I do in the bedroom? Use night lights. Make sure that you have a light by your bed that is easy to reach. Do not use any sheets or blankets that are too big for your bed. They should not hang down onto the floor. Have a firm chair that has side arms. You can use this for support while you get dressed. Do not have throw rugs and other things on the floor that can make you trip. What can I do in the kitchen? Clean up any spills right away. Avoid walking on wet floors. Keep items that you use a lot in easy-to-reach places. If you need to reach something  above you, use a strong step stool that has a grab bar. Keep electrical cords out of the way. Do not use floor polish or wax that makes floors slippery. If you must use wax, use non-skid floor wax. Do not have throw rugs and other things on the floor that can make you trip. What can I do with my stairs? Do not leave any items on the stairs. Make sure that there are handrails on both sides of the stairs and use them. Fix handrails that are broken or loose. Make sure that handrails are as long as the stairways. Check any carpeting to make sure that it is firmly attached to the stairs. Fix any carpet that is loose or worn. Avoid having throw rugs at the top or bottom of the stairs. If you do have throw rugs, attach them to the floor with carpet tape. Make sure that you have a light switch at the top of the  stairs and the bottom of the stairs. If you do not have them, ask someone to add them for you. What else can I do to help prevent falls? Wear shoes that: Do not have high heels. Have rubber bottoms. Are comfortable and fit you well. Are closed at the toe. Do not wear sandals. If you use a stepladder: Make sure that it is fully opened. Do not climb a closed stepladder. Make sure that both sides of the stepladder are locked into place. Ask someone to hold it for you, if possible. Clearly mark and make sure that you can see: Any grab bars or handrails. First and last steps. Where the edge of each step is. Use tools that help you move around (mobility aids) if they are needed. These include: Canes. Walkers. Scooters. Crutches. Turn on the lights when you go into a dark area. Replace any light bulbs as soon as they burn out. Set up your furniture so you have a clear path. Avoid moving your furniture around. If any of your floors are uneven, fix them. If there are any pets around you, be aware of where they are. Review your medicines with your doctor. Some medicines can make you feel dizzy. This can increase your chance of falling. Ask your doctor what other things that you can do to help prevent falls. This information is not intended to replace advice given to you by your health care provider. Make sure you discuss any questions you have with your health care provider. Document Released: 03/03/2009 Document Revised: 10/13/2015 Document Reviewed: 06/11/2014 Elsevier Interactive Patient Education  2017 [employer].

## 2023-01-01 ENCOUNTER — Telehealth: Payer: Self-pay | Admitting: Family Medicine

## 2023-01-01 DIAGNOSIS — Z1211 Encounter for screening for malignant neoplasm of colon: Secondary | ICD-10-CM

## 2023-01-01 NOTE — Telephone Encounter (Signed)
Pt called in requesting a call back regarding scheduling for a colonoscopy that would be covered by his insurance [phone]

## 2023-01-04 NOTE — Telephone Encounter (Signed)
LMTCB

## 2023-01-04 NOTE — Telephone Encounter (Signed)
Called patient for further clarification on if he needed a referral or if he knew where he needed to go. Patient stated that he called around to see what he needed and final answer was that his insurance company needs a decision from PCP on which test he needs; either cologuard or colonoscopy? Patient stated that GI attempted to do colonoscopy last year but could not be done due to not being cleaned out enough to proceed with procedure. He has no family hx of any colon cancer. He did do the cologuard which was positive before which prompted the failed colonoscopy. Patient needs to know which he needs to do at this point?

## 2023-01-06 NOTE — Addendum Note (Signed)
Addended by: Joaquim Nam on: 01/06/2023 11:03 PM   Modules accepted: Orders

## 2023-01-06 NOTE — Telephone Encounter (Signed)
Would try repeat colonoscopy.  I put in the referral.  Thanks.

## 2023-01-07 NOTE — Telephone Encounter (Signed)
Patient notified referral was done for GI to have colonoscopy done.

## 2023-01-08 ENCOUNTER — Encounter: Payer: Self-pay | Admitting: *Deleted

## 2023-01-10 ENCOUNTER — Other Ambulatory Visit: Payer: Self-pay

## 2023-01-10 ENCOUNTER — Telehealth: Payer: Self-pay

## 2023-01-10 DIAGNOSIS — Z1211 Encounter for screening for malignant neoplasm of colon: Secondary | ICD-10-CM

## 2023-01-10 MED ORDER — NA SULFATE-K SULFATE-MG SULF 17.5-3.13-1.6 GM/177ML PO SOLN: 1.0000 | Freq: Once | ORAL | 0 refills | Status: AC

## 2023-01-10 NOTE — Telephone Encounter (Signed)
   Gastroenterology Pre-Procedure Review  Request Date: 02/28/23 Requesting Physician: Dr. Tobi Bastos  PATIENT REVIEW QUESTIONS: The patient responded to the following health history questions as indicated:    1. Are you having any GI issues? no 2. Do you have a personal history of Polyps? no poor prep on previous colonoscopy 02/08/22 3. Do you have a family history of Colon Cancer or Polyps? no 4. Diabetes Mellitus? yes (has been advised to stop Trulicity 7days before colonoscopy. Stop farxiga 3 days prior to colonoscopy) 5. Joint replacements in the past 12 months?no 6. Major health problems in the past 3 months?no 7. Any artificial heart valves, MVP, or defibrillator?no 8. Cardiac health issues yes-clearance to be sent to CV Preop Team    MEDICATIONS & ALLERGIES:    Patient reports the following regarding taking any anticoagulation/antiplatelet therapy:   Plavix, Coumadin, Eliquis, Xarelto, Lovenox, Pradaxa, Brilinta, or Effient? no Aspirin? no  Patient confirms/reports the following medications:  Current Outpatient Medications  Medication Sig Dispense Refill   Na Sulfate-K Sulfate-Mg Sulf 17.5-3.13-1.6 GM/177ML SOLN Take 1 kit by mouth once for 1 dose. 354 mL 0   allopurinol (ZYLOPRIM) 300 MG tablet TAKE 1 TABLET BY MOUTH EVERY DAY 90 tablet 1   amLODipine (NORVASC) 2.5 MG tablet Take 1 tablet (2.5 mg total) by mouth daily. 90 tablet 3   B-D UF III MINI PEN NEEDLES 31G X 5 MM MISC USE DAILY WITH INSULIN INJECTION 100 each 1   calcitRIOL (ROCALTROL) 0.25 MCG capsule Take 0.25 mcg by mouth 3 (three) times a week.     clotrimazole-betamethasone (LOTRISONE) cream Apply to affected area twice a day as needed. 45 g 1   Continuous Glucose Sensor (FREESTYLE LIBRE 2 SENSOR) MISC Apply sensor every 14 days to monitor sugar continously 2 each 5   dapagliflozin propanediol (FARXIGA) 10 MG TABS tablet Take 1 tablet (10 mg total) by mouth daily before breakfast. 30 tablet 2   ezetimibe (ZETIA) 10 MG  tablet TAKE 1 TABLET BY MOUTH EVERY DAY 90 tablet 0   furosemide (LASIX) 20 MG tablet TAKE 1 TABLET BY MOUTH EVERY DAY 90 tablet 0   metoprolol succinate (TOPROL-XL) 100 MG 24 hr tablet TAKE 1 TABLET BY MOUTH EVERY DAY WITH OR IMMEDIATELY FOLLOWING A MEAL 90 tablet 2   metoprolol succinate (TOPROL-XL) 25 MG 24 hr tablet Take 25 mg by mouth 2 (two) times daily.     nitroGLYCERIN (NITROSTAT) 0.4 MG SL tablet Place 1 tablet (0.4 mg total) under the tongue every 5 (five) minutes as needed for chest pain. (Patient not taking: Reported on 12/31/2022) 50 tablet 3   rosuvastatin (CRESTOR) 10 MG tablet Take 1 tablet (10 mg total) by mouth daily. 90 tablet 3   Semaglutide, 2 MG/DOSE, (OZEMPIC, 2 MG/DOSE,) 8 MG/3ML SOPN INJECT 2 MG AS DIRECTED ONCE A WEEK. 3 mL 5   telmisartan (MICARDIS) 80 MG tablet TAKE 1 TABLET BY MOUTH 1 TIME EACH DAY. 90 tablet 3   No current facility-administered medications for this visit.    Patient confirms/reports the following allergies:  Allergies  Allergen Reactions   Codeine Sulfate Hives and Swelling   Sildenafil Other (See Comments)    flushing   Nsaids     CKD    No orders of the defined types were placed in this encounter.   AUTHORIZATION INFORMATION Primary Insurance: 1D#: Group #:  Secondary Insurance: 1D#: Group #:  SCHEDULE INFORMATION: Date:  Time: Location:

## 2023-01-17 ENCOUNTER — Telehealth: Payer: Self-pay

## 2023-01-17 NOTE — Telephone Encounter (Signed)
Cardiac clearance has been sent to CV Preop team for patients scheduled colonoscopy with Dr. Tobi Bastos on 02/28/23.  Thanks, [place], [place]

## 2023-01-18 ENCOUNTER — Telehealth: Payer: Self-pay | Admitting: *Deleted

## 2023-01-18 NOTE — Telephone Encounter (Signed)
-----   Message from Joylene Grapes sent at 01/17/2023 10:51 AM EDT ----- Regarding: Clinical Why do we keep getting requests like this? Is there any way to fix it?

## 2023-01-18 NOTE — Telephone Encounter (Signed)
   Pre-operative Risk Assessment    Patient Name: Luis Joseph  DOB: 12/10/1955 MRN: [id number]   DATE OF LAST VISIT: 08/09/21 DR. CHANDRASEKHAR DATE OF NEXT VISIT: 02/21/23 DR. [facility]  Request for Surgical Clearance    Procedure:   COLONOSCOPY  Date of Surgery:  Clearance 02/28/23                                 Surgeon:  DR. Sharlet Salina ANNA Surgeon's Group or Practice Name:  [facility] Phone number:  [phone] Fax number:  [phone]   Type of Clearance Requested:   - Medical ; NO MEDICATIONS LISTED AS NEEDING TO BE HELD   Type of Anesthesia:  General    Additional requests/questions:    Elpidio Anis   01/18/2023, 4:26 PM   Letter Reason for letter: Clinical Avie Arenas, CMA on 01/17/2023     [facility] [address], [address] [place], [place]  [address] Phone:  [phone]   Fax:  [phone]   01/17/2023   Patient Name: Luis Joseph   DOB: 1955-11-13   To CV Preop Team:       [facility] Pre-operative Risk Assessment    Request for surgical clearance:   What type of surgery is being performed? Colonoscopy    When is this surgery scheduled? 02/28/23    Are there any medications that need to be held prior to surgery and how long?No    Practice name and name of physician performing surgery? [facility] Dr. Wyline Mood    What is your office phone and fax number? O# [phone] F# [phone]    Anesthesia type (None, local, MAC, general) ? General       _________________________________________________________________   (provider comments below)    [facility] at [facility], [phone]

## 2023-01-18 NOTE — Telephone Encounter (Signed)
   Name: Luis Joseph  DOB: [birth year]  MRN: [id number]  Primary Cardiologist: Christell Constant, MD  Chart reviewed as part of pre-operative protocol coverage. The patient has an upcoming visit scheduled with Dr. Izora Ribas on 02/21/2023 at which time clearance can be addressed in case there are any issues that would impact surgical recommendations.  Colonoscopy is not scheduled until 10/40/2024 as below. I added preop FYI to appointment note so that provider is aware to address at time of outpatient visit.  Per office protocol the cardiology provider should forward their finalized clearance decision and recommendations regarding antiplatelet therapy to the requesting party below.    I will route this message as FYI to requesting party and remove this message from the preop box as separate preop APP input not needed at this time.   Please call with any questions.  Joylene Grapes, NP  01/18/2023, 4:36 PM

## 2023-02-21 ENCOUNTER — Encounter: Payer: Self-pay | Admitting: Gastroenterology

## 2023-02-21 ENCOUNTER — Ambulatory Visit: Payer: Medicare Other | Attending: Internal Medicine | Admitting: Internal Medicine

## 2023-02-21 ENCOUNTER — Other Ambulatory Visit: Payer: Self-pay

## 2023-02-21 ENCOUNTER — Encounter: Payer: Self-pay | Admitting: Internal Medicine

## 2023-02-21 VITALS — BP 120/80 | HR 68 | Ht 71.0 in | Wt 210.0 lb

## 2023-02-21 DIAGNOSIS — E782 Mixed hyperlipidemia: Secondary | ICD-10-CM | POA: Diagnosis present

## 2023-02-21 DIAGNOSIS — E1122 Type 2 diabetes mellitus with diabetic chronic kidney disease: Secondary | ICD-10-CM | POA: Diagnosis present

## 2023-02-21 DIAGNOSIS — I471 Supraventricular tachycardia, unspecified: Secondary | ICD-10-CM | POA: Diagnosis present

## 2023-02-21 DIAGNOSIS — I10 Essential (primary) hypertension: Secondary | ICD-10-CM | POA: Diagnosis present

## 2023-02-21 DIAGNOSIS — Z01818 Encounter for other preprocedural examination: Secondary | ICD-10-CM | POA: Diagnosis present

## 2023-02-21 DIAGNOSIS — I700 Atherosclerosis of aorta: Secondary | ICD-10-CM | POA: Diagnosis present

## 2023-02-21 NOTE — Patient Instructions (Signed)
Medication Instructions:  Your physician has recommended you make the following change in your medication:  STOP: metoprolol succinate 25 mg  *If you need a refill on your cardiac medications before your next appointment, please call your pharmacy*   Lab Work: NONE If you have labs (blood work) drawn today and your tests are completely normal, you will receive your results only by: MyChart Message (if you have MyChart) OR A paper copy in the mail If you have any lab test that is abnormal or we need to change your treatment, we will call you to review the results.   Testing/Procedures: NONE   Follow-Up: At [facility], you and your health needs are our priority.  As part of our continuing mission to provide you with exceptional heart care, we have created designated Provider Care Teams.  These Care Teams include your primary Cardiologist (physician) and Advanced Practice Providers (APPs -  Physician Assistants and Nurse Practitioners) who all work together to provide you with the care you need, when you need it.   Your next appointment:   1 year(s)  Provider:   Christell Constant, MD

## 2023-02-21 NOTE — Progress Notes (Signed)
Cardiology Office Note:    Date:  02/21/2023   ID:  Luis Joseph, DOB [birth year], MRN [id number]  PCP:  Joaquim Nam, MD   [facility] Providers Cardiologist:  Christell Constant, MD     Referring MD: Joaquim Nam, MD   CC:  F/u LE edema  History of Present Illness:    Luis Joseph is a 67 y.o. male with a hx of HTN, HLD and aortic atherosclerosis, CKD Stage IIIa who presented in 2022 after stress test.  Patient had noted chest pain and pressure at work during  2022:  DOD call 9/22 for chest pain, has NM Stress, short run o fSVT without perfusion defect.  Found to have new LE edema and started on diuretic. 2023: Started ozempic   Luis Joseph, a 67 year old male with a history of hypertension, hyperlipidemia, aortic arthritis, and CKD stage 3a seen in one year follow up.  He reports no recent chest pain or abnormal heartbeats. He is currently on metoprolol and has recently been taken off amlodipine by his kidney doctor due to well-controlled blood pressure. He has lost significant weight, dropping from 250 lbs in 2023 to 205 lbs currently. He is also on Ozempic for weight loss. He has not had any recent cholesterol checks.  He also reports a recent bout of bronchitis and COVID-19, during which he lost 8-10 lbs. He has been working and recently returned from a trip to [place]. He has no other new symptoms or concerns.  He is presently on high dose metoprolol.  Past Medical History:  Diagnosis Date   Allergic rhinitis    Anemia in chronic kidney disease    CKD (chronic kidney disease), stage III (HCC)    NEPHROLOGIST-  DR Lamont Dowdy   Diabetes mellitus, type 2 (HCC)    diet controlled   GERD (gastroesophageal reflux disease) 05/21/1978   Gout 05/21/1985   Hematuria    History of kidney stones    Hydronephrosis, right    Hyperlipidemia 05/21/1988   Hyperparathyroidism, secondary renal (HCC)    Hypertension 05/21/1978   Migraines    Better after  gastric bypass   Renal cyst, left     Past Surgical History:  Procedure Laterality Date   COLONOSCOPY WITH PROPOFOL N/A 03/02/2020   Procedure: COLONOSCOPY WITH PROPOFOL;  Surgeon: Pasty Spillers, MD;  Location: [facility] ENDOSCOPY;  Service: Endoscopy;  Laterality: N/A;  priority 3   COLONOSCOPY WITH PROPOFOL N/A 02/08/2022   Procedure: COLONOSCOPY WITH PROPOFOL;  Surgeon: Wyline Mood, MD;  Location: [facility] ENDOSCOPY;  Service: Gastroenterology;  Laterality: N/A;   CYSTOSCOPY W/ URETERAL STENT PLACEMENT Right 11/25/2012   Procedure: CYSTOSCOPY WITH RETROGRADE PYELOGRAM/URETERAL STENT PLACEMENT;  Surgeon: Lindaann Slough, MD;  Location: [facility];  Service: Urology;  Laterality: Right;   CYSTOSCOPY/RETROGRADE/URETEROSCOPY Right 02/10/2013   Procedure: CYSTOSCOPY/RETROGRADE/URETEROSCOPY Right stent removal  Procedure: Cysto, Right Retrograde, Right Stent Pull, Right Diagnostic Ureteroscopy;  Surgeon: Sebastian Ache, MD;  Location: [facility];  Service: Urology;  Laterality: Right;   GASTRIC ROUX-EN-Y N/A 03/01/2014   Procedure: LAPAROSCOPIC ROUX-EN-Y GASTRIC BYPASS WITH UPPER ENDOSCOPY;  Surgeon: Wenda Low, MD;  Location: [facility];  Service: General;  Laterality: N/A;   LASIK     ORIF ANKLE FRACTURE Left 11/14/2016   Procedure: OPEN REDUCTION INTERNAL FIXATION (ORIF) LEFT TRIMALLEOLAR ANKLE FRACTURE;  Surgeon: Tarry Kos, MD;  Location: [facility];  Service: Orthopedics;  Laterality: Left;   PARTIAL NEPHRECTOMY  12/19/1977  Cardiology Office Note:    Date:  02/21/2023   ID:  Luis Joseph, DOB [birth year], MRN [id number]  PCP:  Joaquim Nam, MD   [facility] Providers Cardiologist:  Christell Constant, MD     Referring MD: Joaquim Nam, MD   CC:  F/u LE edema  History of Present Illness:    Luis Joseph is a 67 y.o. male with a hx of HTN, HLD and aortic atherosclerosis, CKD Stage IIIa who presented in 2022 after stress test.  Patient had noted chest pain and pressure at work during  2022:  DOD call 9/22 for chest pain, has NM Stress, short run o fSVT without perfusion defect.  Found to have new LE edema and started on diuretic. 2023: Started ozempic   Luis Joseph, a 67 year old male with a history of hypertension, hyperlipidemia, aortic arthritis, and CKD stage 3a seen in one year follow up.  He reports no recent chest pain or abnormal heartbeats. He is currently on metoprolol and has recently been taken off amlodipine by his kidney doctor due to well-controlled blood pressure. He has lost significant weight, dropping from 250 lbs in 2023 to 205 lbs currently. He is also on Ozempic for weight loss. He has not had any recent cholesterol checks.  He also reports a recent bout of bronchitis and COVID-19, during which he lost 8-10 lbs. He has been working and recently returned from a trip to [place] [employer]. He has no other new symptoms or concerns.  He is presently on high dose metoprolol.  Past Medical History:  Diagnosis Date   Allergic rhinitis    Anemia in chronic kidney disease    CKD (chronic kidney disease), stage III (HCC)    NEPHROLOGIST-  DR Lamont Dowdy   Diabetes mellitus, type 2 (HCC)    diet controlled   GERD (gastroesophageal reflux disease) 05/21/1978   Gout 05/21/1985   Hematuria    History of kidney stones    Hydronephrosis, right    Hyperlipidemia 05/21/1988   Hyperparathyroidism, secondary renal (HCC)    Hypertension 05/21/1978   Migraines    Better after  gastric bypass   Renal cyst, left     Past Surgical History:  Procedure Laterality Date   COLONOSCOPY WITH PROPOFOL N/A 03/02/2020   Procedure: COLONOSCOPY WITH PROPOFOL;  Surgeon: Pasty Spillers, MD;  Location: [facility] ENDOSCOPY;  Service: Endoscopy;  Laterality: N/A;  priority 3   COLONOSCOPY WITH PROPOFOL N/A 02/08/2022   Procedure: COLONOSCOPY WITH PROPOFOL;  Surgeon: Wyline Mood, MD;  Location: [facility] ENDOSCOPY;  Service: Gastroenterology;  Laterality: N/A;   CYSTOSCOPY W/ URETERAL STENT PLACEMENT Right 11/25/2012   Procedure: CYSTOSCOPY WITH RETROGRADE PYELOGRAM/URETERAL STENT PLACEMENT;  Surgeon: Lindaann Slough, MD;  Location: [facility];  Service: Urology;  Laterality: Right;   CYSTOSCOPY/RETROGRADE/URETEROSCOPY Right 02/10/2013   Procedure: CYSTOSCOPY/RETROGRADE/URETEROSCOPY Right stent removal  Procedure: Cysto, Right Retrograde, Right Stent Pull, Right Diagnostic Ureteroscopy;  Surgeon: Sebastian Ache, MD;  Location: [facility];  Service: Urology;  Laterality: Right;   GASTRIC ROUX-EN-Y N/A 03/01/2014   Procedure: LAPAROSCOPIC ROUX-EN-Y GASTRIC BYPASS WITH UPPER ENDOSCOPY;  Surgeon: Wenda Low, MD;  Location: [facility];  Service: General;  Laterality: N/A;   LASIK     ORIF ANKLE FRACTURE Left 11/14/2016   Procedure: OPEN REDUCTION INTERNAL FIXATION (ORIF) LEFT TRIMALLEOLAR ANKLE FRACTURE;  Surgeon: Tarry Kos, MD;  Location: [facility];  Service: Orthopedics;  Laterality: Left;   PARTIAL NEPHRECTOMY  12/19/1977  Transportation (Medical): No    Lack of Transportation (Non-Medical): No  Physical Activity: Inactive (12/31/2022)   Exercise Vital Sign    Days of Exercise per Week: 0 days    Minutes of Exercise per Session: 0 min  Stress: No Stress Concern Present (12/31/2022)   [employer] of Occupational Health - Occupational Stress Questionnaire    Feeling of Stress : Not at all  Social Connections: Moderately Isolated (12/31/2022)   Social Connection and Isolation Panel [NHANES]    Frequency of Communication with Friends and Family: More than three times a week    Frequency of Social Gatherings with Friends and Family: More than three times a week    Attends Religious Services: Never    Database administrator or Organizations: No    Attends Banker Meetings: Never    Marital Status: Married    Social: works Teacher, adult education, 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 Live in [place] When to [place] in 2022 and 2024  Family History: The patient's family history includes Cancer in his father; Diabetes in his maternal grandfather, mother, and sister; Heart disease in his maternal grandfather and mother;  Hypertension in his mother. There is no history of Stroke, Prostate cancer, or Colon cancer.  ROS:   Please see the history of present illness.      EKGs/Labs/Other Studies Reviewed:    The following studies were reviewed today:  EKG:   04/05/21: SR rate 62 WNL  CT Abdomen Pelvis: Date:06/21/20 Results: Aortic atherosclerosis and CAC in LAD, D1, Lcx, and RCA  Cardiac Studies & Procedures     STRESS TESTS  MYOCARDIAL PERFUSION IMAGING 02/20/2021  Narrative   The study is normal. The study is low risk.   No ST deviation was noted.   Left ventricular function is normal. End diastolic cavity size is normal. End systolic cavity size is normal.   Prior study not available for comparison.  Findings Post stress PVCs, PACs, and short run of SVT. No evidence of ischemia or infarction.  Conclusions: Stress test is negative, low risk study.   ECHOCARDIOGRAM  ECHOCARDIOGRAM COMPLETE 05/17/2021  Narrative ECHOCARDIOGRAM REPORT    Patient Name:   Luis Joseph Date of Exam: 05/17/2021 Medical Rec #:  [id number]       Height:       70.0 in Accession #:    [id number]      Weight:       252.0 lb Date of Birth:  [birth year]        BSA:          2.303 m Patient Age:    65 years        BP:           124/82 mmHg Patient Gender: M               HR:           69 bpm. Exam Location:  [address]  Procedure: 2D Echo, 3D Echo, Cardiac Doppler and Color Doppler  Indications:    R06.09 Dyspnea R60.0 Edema  History:        Patient has no prior history of Echocardiogram examinations. Signs/Symptoms:Dyspnea and Edema; Risk Factors:Family History of Coronary Artery Disease, Hypertension, Diabetes, Dyslipidemia and Former Smoker. Chronic Kidney Disease.  Sonographer:    Farrel Conners RDCS Referring Phys: Joaquim Nam  IMPRESSIONS   1. Left ventricular ejection fraction, by estimation, is 60 to 65%. Left ventricular ejection  Cardiology Office Note:    Date:  02/21/2023   ID:  Luis Joseph, DOB [birth year], MRN [id number]  PCP:  Joaquim Nam, MD   [facility] Providers Cardiologist:  Christell Constant, MD     Referring MD: Joaquim Nam, MD   CC:  F/u LE edema  History of Present Illness:    Luis Joseph is a 67 y.o. male with a hx of HTN, HLD and aortic atherosclerosis, CKD Stage IIIa who presented in 2022 after stress test.  Patient had noted chest pain and pressure at work during  2022:  DOD call 9/22 for chest pain, has NM Stress, short run o fSVT without perfusion defect.  Found to have new LE edema and started on diuretic. 2023: Started ozempic   Luis Joseph, a 67 year old male with a history of hypertension, hyperlipidemia, aortic arthritis, and CKD stage 3a seen in one year follow up.  He reports no recent chest pain or abnormal heartbeats. He is currently on metoprolol and has recently been taken off amlodipine by his kidney doctor due to well-controlled blood pressure. He has lost significant weight, dropping from 250 lbs in 2023 to 205 lbs currently. He is also on Ozempic for weight loss. He has not had any recent cholesterol checks.  He also reports a recent bout of bronchitis and COVID-19, during which he lost 8-10 lbs. He has been working and recently returned from a trip to [place] [employer]. He has no other new symptoms or concerns.  He is presently on high dose metoprolol.  Past Medical History:  Diagnosis Date   Allergic rhinitis    Anemia in chronic kidney disease    CKD (chronic kidney disease), stage III (HCC)    NEPHROLOGIST-  DR Lamont Dowdy   Diabetes mellitus, type 2 (HCC)    diet controlled   GERD (gastroesophageal reflux disease) 05/21/1978   Gout 05/21/1985   Hematuria    History of kidney stones    Hydronephrosis, right    Hyperlipidemia 05/21/1988   Hyperparathyroidism, secondary renal (HCC)    Hypertension 05/21/1978   Migraines    Better after  gastric bypass   Renal cyst, left     Past Surgical History:  Procedure Laterality Date   COLONOSCOPY WITH PROPOFOL N/A 03/02/2020   Procedure: COLONOSCOPY WITH PROPOFOL;  Surgeon: Pasty Spillers, MD;  Location: [facility] ENDOSCOPY;  Service: Endoscopy;  Laterality: N/A;  priority 3   COLONOSCOPY WITH PROPOFOL N/A 02/08/2022   Procedure: COLONOSCOPY WITH PROPOFOL;  Surgeon: Wyline Mood, MD;  Location: [facility];  Service: Gastroenterology;  Laterality: N/A;   CYSTOSCOPY W/ URETERAL STENT PLACEMENT Right 11/25/2012   Procedure: CYSTOSCOPY WITH RETROGRADE PYELOGRAM/URETERAL STENT PLACEMENT;  Surgeon: Lindaann Slough, MD;  Location: [facility];  Service: Urology;  Laterality: Right;   CYSTOSCOPY/RETROGRADE/URETEROSCOPY Right 02/10/2013   Procedure: CYSTOSCOPY/RETROGRADE/URETEROSCOPY Right stent removal  Procedure: Cysto, Right Retrograde, Right Stent Pull, Right Diagnostic Ureteroscopy;  Surgeon: Sebastian Ache, MD;  Location: [facility];  Service: Urology;  Laterality: Right;   GASTRIC ROUX-EN-Y N/A 03/01/2014   Procedure: LAPAROSCOPIC ROUX-EN-Y GASTRIC BYPASS WITH UPPER ENDOSCOPY;  Surgeon: Wenda Low, MD;  Location: [facility];  Service: General;  Laterality: N/A;   LASIK     ORIF ANKLE FRACTURE Left 11/14/2016   Procedure: OPEN REDUCTION INTERNAL FIXATION (ORIF) LEFT TRIMALLEOLAR ANKLE FRACTURE;  Surgeon: Tarry Kos, MD;  Location: [facility];  Service: Orthopedics;  Laterality: Left;   PARTIAL NEPHRECTOMY  12/19/1977  Cardiology Office Note:    Date:  02/21/2023   ID:  Luis Joseph, DOB [birth year], MRN [id number]  PCP:  Joaquim Nam, MD   [facility] Providers Cardiologist:  Christell Constant, MD     Referring MD: Joaquim Nam, MD   CC:  F/u LE edema  History of Present Illness:    Luis Joseph is a 67 y.o. male with a hx of HTN, HLD and aortic atherosclerosis, CKD Stage IIIa who presented in 2022 after stress test.  Patient had noted chest pain and pressure at work during  2022:  DOD call 9/22 for chest pain, has NM Stress, short run o fSVT without perfusion defect.  Found to have new LE edema and started on diuretic. 2023: Started ozempic   Luis Joseph, a 67 year old male with a history of hypertension, hyperlipidemia, aortic arthritis, and CKD stage 3a seen in one year follow up.  He reports no recent chest pain or abnormal heartbeats. He is currently on metoprolol and has recently been taken off amlodipine by his kidney doctor due to well-controlled blood pressure. He has lost significant weight, dropping from 250 lbs in 2023 to 205 lbs currently. He is also on Ozempic for weight loss. He has not had any recent cholesterol checks.  He also reports a recent bout of bronchitis and COVID-19, during which he lost 8-10 lbs. He has been working and recently returned from a trip to [place] [employer]. He has no other new symptoms or concerns.  He is presently on high dose metoprolol.  Past Medical History:  Diagnosis Date   Allergic rhinitis    Anemia in chronic kidney disease    CKD (chronic kidney disease), stage III (HCC)    NEPHROLOGIST-  DR Lamont Dowdy   Diabetes mellitus, type 2 (HCC)    diet controlled   GERD (gastroesophageal reflux disease) 05/21/1978   Gout 05/21/1985   Hematuria    History of kidney stones    Hydronephrosis, right    Hyperlipidemia 05/21/1988   Hyperparathyroidism, secondary renal (HCC)    Hypertension 05/21/1978   Migraines    Better after  gastric bypass   Renal cyst, left     Past Surgical History:  Procedure Laterality Date   COLONOSCOPY WITH PROPOFOL N/A 03/02/2020   Procedure: COLONOSCOPY WITH PROPOFOL;  Surgeon: Pasty Spillers, MD;  Location: [facility];  Service: Endoscopy;  Laterality: N/A;  priority 3   COLONOSCOPY WITH PROPOFOL N/A 02/08/2022   Procedure: COLONOSCOPY WITH PROPOFOL;  Surgeon: Wyline Mood, MD;  Location: [facility];  Service: Gastroenterology;  Laterality: N/A;   CYSTOSCOPY W/ URETERAL STENT PLACEMENT Right 11/25/2012   Procedure: CYSTOSCOPY WITH RETROGRADE PYELOGRAM/URETERAL STENT PLACEMENT;  Surgeon: Lindaann Slough, MD;  Location: [facility];  Service: Urology;  Laterality: Right;   CYSTOSCOPY/RETROGRADE/URETEROSCOPY Right 02/10/2013   Procedure: CYSTOSCOPY/RETROGRADE/URETEROSCOPY Right stent removal  Procedure: Cysto, Right Retrograde, Right Stent Pull, Right Diagnostic Ureteroscopy;  Surgeon: Sebastian Ache, MD;  Location: [facility];  Service: Urology;  Laterality: Right;   GASTRIC ROUX-EN-Y N/A 03/01/2014   Procedure: LAPAROSCOPIC ROUX-EN-Y GASTRIC BYPASS WITH UPPER ENDOSCOPY;  Surgeon: Wenda Low, MD;  Location: [facility];  Service: General;  Laterality: N/A;   LASIK     ORIF ANKLE FRACTURE Left 11/14/2016   Procedure: OPEN REDUCTION INTERNAL FIXATION (ORIF) LEFT TRIMALLEOLAR ANKLE FRACTURE;  Surgeon: Tarry Kos, MD;  Location: [facility];  Service: Orthopedics;  Laterality: Left;   PARTIAL NEPHRECTOMY  12/19/1977  Transportation (Medical): No    Lack of Transportation (Non-Medical): No  Physical Activity: Inactive (12/31/2022)   Exercise Vital Sign    Days of Exercise per Week: 0 days    Minutes of Exercise per Session: 0 min  Stress: No Stress Concern Present (12/31/2022)   [employer] of Occupational Health - Occupational Stress Questionnaire    Feeling of Stress : Not at all  Social Connections: Moderately Isolated (12/31/2022)   Social Connection and Isolation Panel [NHANES]    Frequency of Communication with Friends and Family: More than three times a week    Frequency of Social Gatherings with Friends and Family: More than three times a week    Attends Religious Services: Never    Database administrator or Organizations: No    Attends Banker Meetings: Never    Marital Status: Married    Social: works Teacher, adult education, 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 Live in [place] When to [place] in 2022 and 2024  Family History: The patient's family history includes Cancer in his father; Diabetes in his maternal grandfather, mother, and sister; Heart disease in his maternal grandfather and mother;  Hypertension in his mother. There is no history of Stroke, Prostate cancer, or Colon cancer.  ROS:   Please see the history of present illness.      EKGs/Labs/Other Studies Reviewed:    The following studies were reviewed today:  EKG:   04/05/21: SR rate 62 WNL  CT Abdomen Pelvis: Date:06/21/20 Results: Aortic atherosclerosis and CAC in LAD, D1, Lcx, and RCA  Cardiac Studies & Procedures     STRESS TESTS  MYOCARDIAL PERFUSION IMAGING 02/20/2021  Narrative   The study is normal. The study is low risk.   No ST deviation was noted.   Left ventricular function is normal. End diastolic cavity size is normal. End systolic cavity size is normal.   Prior study not available for comparison.  Findings Post stress PVCs, PACs, and short run of SVT. No evidence of ischemia or infarction.  Conclusions: Stress test is negative, low risk study.   ECHOCARDIOGRAM  ECHOCARDIOGRAM COMPLETE 05/17/2021  Narrative ECHOCARDIOGRAM REPORT    Patient Name:   Luis Joseph Date of Exam: 05/17/2021 Medical Rec #:  [id number]       Height:       70.0 in Accession #:    [id number]      Weight:       252.0 lb Date of Birth:  [birth year]        BSA:          2.303 m Patient Age:    65 years        BP:           124/82 mmHg Patient Gender: M               HR:           69 bpm. Exam Location:  [address]  Procedure: 2D Echo, 3D Echo, Cardiac Doppler and Color Doppler  Indications:    R06.09 Dyspnea R60.0 Edema  History:        Patient has no prior history of Echocardiogram examinations. Signs/Symptoms:Dyspnea and Edema; Risk Factors:Family History of Coronary Artery Disease, Hypertension, Diabetes, Dyslipidemia and Former Smoker. Chronic Kidney Disease.  Sonographer:    Farrel Conners RDCS Referring Phys: Joaquim Nam  IMPRESSIONS   1. Left ventricular ejection fraction, by estimation, is 60 to 65%. Left ventricular ejection

## 2023-02-28 ENCOUNTER — Ambulatory Visit: Payer: Medicare Other | Admitting: Anesthesiology

## 2023-02-28 ENCOUNTER — Encounter: Payer: Self-pay | Admitting: Gastroenterology

## 2023-02-28 ENCOUNTER — Ambulatory Visit
Admission: RE | Admit: 2023-02-28 | Discharge: 2023-02-28 | Disposition: A | Discharge status: Home / Self Care | Payer: Medicare Other | Attending: Gastroenterology | Admitting: Gastroenterology

## 2023-02-28 ENCOUNTER — Encounter
Admission: RE | Disposition: A | Discharge status: Home / Self Care | Payer: Self-pay | Source: Home / Self Care | Attending: Gastroenterology

## 2023-02-28 DIAGNOSIS — I129 Hypertensive chronic kidney disease with stage 1 through stage 4 chronic kidney disease, or unspecified chronic kidney disease: Secondary | ICD-10-CM | POA: Diagnosis not present

## 2023-02-28 DIAGNOSIS — Z833 Family history of diabetes mellitus: Secondary | ICD-10-CM | POA: Insufficient documentation

## 2023-02-28 DIAGNOSIS — N2581 Secondary hyperparathyroidism of renal origin: Secondary | ICD-10-CM | POA: Insufficient documentation

## 2023-02-28 DIAGNOSIS — D631 Anemia in chronic kidney disease: Secondary | ICD-10-CM | POA: Diagnosis not present

## 2023-02-28 DIAGNOSIS — Z9884 Bariatric surgery status: Secondary | ICD-10-CM | POA: Diagnosis not present

## 2023-02-28 DIAGNOSIS — Z1211 Encounter for screening for malignant neoplasm of colon: Secondary | ICD-10-CM | POA: Diagnosis not present

## 2023-02-28 DIAGNOSIS — E1122 Type 2 diabetes mellitus with diabetic chronic kidney disease: Secondary | ICD-10-CM | POA: Diagnosis not present

## 2023-02-28 DIAGNOSIS — N183 Chronic kidney disease, stage 3 unspecified: Secondary | ICD-10-CM | POA: Diagnosis not present

## 2023-02-28 DIAGNOSIS — R519 Headache, unspecified: Secondary | ICD-10-CM | POA: Diagnosis not present

## 2023-02-28 DIAGNOSIS — Z87891 Personal history of nicotine dependence: Secondary | ICD-10-CM | POA: Diagnosis not present

## 2023-02-28 DIAGNOSIS — K5730 Diverticulosis of large intestine without perforation or abscess without bleeding: Secondary | ICD-10-CM | POA: Insufficient documentation

## 2023-02-28 DIAGNOSIS — Z905 Acquired absence of kidney: Secondary | ICD-10-CM | POA: Diagnosis not present

## 2023-02-28 DIAGNOSIS — Z79899 Other long term (current) drug therapy: Secondary | ICD-10-CM | POA: Insufficient documentation

## 2023-02-28 HISTORY — PX: COLONOSCOPY WITH PROPOFOL: SHX5780

## 2023-02-28 SURGERY — COLONOSCOPY WITH PROPOFOL
Anesthesia: General

## 2023-02-28 MED ORDER — PROPOFOL 10 MG/ML IV BOLUS
INTRAVENOUS | Status: DC | PRN
  Administered 2023-02-28: 70 mg via INTRAVENOUS

## 2023-02-28 MED ORDER — SODIUM CHLORIDE 0.9 % IV SOLN: INTRAVENOUS | Status: DC

## 2023-02-28 MED ORDER — LIDOCAINE HCL (CARDIAC) PF 100 MG/5ML IV SOSY
PREFILLED_SYRINGE | INTRAVENOUS | Status: DC | PRN
  Administered 2023-02-28: 100 mg via INTRAVENOUS

## 2023-02-28 MED ORDER — PROPOFOL 500 MG/50ML IV EMUL
INTRAVENOUS | Status: DC | PRN
  Administered 2023-02-28: 165 ug/kg/min via INTRAVENOUS

## 2023-02-28 NOTE — Anesthesia Preprocedure Evaluation (Signed)
Anesthesia Evaluation  Patient identified by MRN, date of birth, ID band Patient awake    Reviewed: Allergy & Precautions, H&P , NPO status , Patient's Chart, lab work & pertinent test results, reviewed documented beta blocker date and time   Airway Mallampati: II   Neck ROM: full    Dental  (+) Poor Dentition   Pulmonary neg pulmonary ROS, former smoker   Pulmonary exam normal        Cardiovascular Exercise Tolerance: Good hypertension, On Medications negative cardio ROS Normal cardiovascular exam Rhythm:regular Rate:Normal     Neuro/Psych  Headaches  negative psych ROS   GI/Hepatic Neg liver ROS,GERD  Medicated,,  Endo/Other  negative endocrine ROSdiabetes, Well Controlled    Renal/GU Renal disease  negative genitourinary   Musculoskeletal   Abdominal   Peds  Hematology  (+) Blood dyscrasia, anemia   Anesthesia Other Findings Past Medical History: No date: Allergic rhinitis No date: Anemia in chronic kidney disease No date: CKD (chronic kidney disease), stage III (HCC)     Comment:  NEPHROLOGIST-  DR Lamont Dowdy No date: Diabetes mellitus, type 2 (HCC)     Comment:  diet controlled 05/21/1978: GERD (gastroesophageal reflux disease) 05/21/1985: Gout No date: Hematuria No date: History of kidney stones No date: Hydronephrosis, right 05/21/1988: Hyperlipidemia No date: Hyperparathyroidism, secondary renal (HCC) 05/21/1978: Hypertension No date: Migraines     Comment:  Better after gastric bypass No date: Renal cyst, left Past Surgical History: 03/02/2020: COLONOSCOPY WITH PROPOFOL; N/A     Comment:  Procedure: COLONOSCOPY WITH PROPOFOL;  Surgeon:               Pasty Spillers, MD;  Location: [facility];                Service: Endoscopy;  Laterality: N/A;  priority 3 02/08/2022: COLONOSCOPY WITH PROPOFOL; N/A     Comment:  Procedure: COLONOSCOPY WITH PROPOFOL;  Surgeon: Wyline Mood, MD;  [facility];  Service:               Gastroenterology;  Laterality: N/A; 11/25/2012: CYSTOSCOPY W/ URETERAL STENT PLACEMENT; Right     Comment:  Procedure: CYSTOSCOPY WITH RETROGRADE PYELOGRAM/URETERAL              STENT PLACEMENT;  Surgeon: Lindaann Slough, MD;                Location: [facility];  Service: Urology;               Laterality: Right; 02/10/2013: CYSTOSCOPY/RETROGRADE/URETEROSCOPY; Right     Comment:  Procedure: CYSTOSCOPY/RETROGRADE/URETEROSCOPY Right               stent removal  Procedure: Cysto, Right Retrograde, Right               Stent Pull, Right Diagnostic Ureteroscopy;  Surgeon:               Sebastian Ache, MD;  Location: [facility];  Service: Urology;  Laterality: Right; 03/01/2014: GASTRIC ROUX-EN-Y; N/A     Comment:  Procedure: LAPAROSCOPIC ROUX-EN-Y GASTRIC BYPASS WITH               UPPER ENDOSCOPY;  Surgeon: Wenda Low, MD;  Location: [facility];  Service: General;  Laterality: N/A; No date: LASIK 11/14/2016: ORIF ANKLE FRACTURE; Left     Comment:  Procedure: OPEN REDUCTION INTERNAL FIXATION (ORIF) LEFT               TRIMALLEOLAR ANKLE FRACTURE;  Surgeon: Tarry Kos, MD;              Location: [facility];  Service:               Orthopedics;  Laterality: Left; 12/19/1977: PARTIAL NEPHRECTOMY     Comment:  RIGHT PYELOPLASTY AND removal benign cyst and stones BMI    Body Mass Index: 28.73 kg/m     Reproductive/Obstetrics negative OB ROS                             Anesthesia Physical Anesthesia Plan  ASA: 3  Anesthesia Plan: General   Post-op Pain Management:    Induction:   PONV Risk Score and Plan:   Airway Management Planned:   Additional Equipment:   Intra-op Plan:   Post-operative Plan:   Informed Consent: I have reviewed the patients History and Physical, chart, labs and discussed the procedure including the  risks, benefits and alternatives for the proposed anesthesia with the patient or authorized representative who has indicated his/her understanding and acceptance.     Dental Advisory Given  Plan Discussed with: CRNA  Anesthesia Plan Comments:        Anesthesia Quick Evaluation

## 2023-02-28 NOTE — Transfer of Care (Signed)
Immediate Anesthesia Transfer of Care Note  Patient: Luis Joseph  Procedure(s) Performed: COLONOSCOPY WITH PROPOFOL  Patient Location: Endoscopy Unit  Anesthesia Type:General  Level of Consciousness: awake and drowsy  Airway & Oxygen Therapy: Patient Spontanous Breathing and Patient connected to face mask oxygen  Post-op Assessment: Report given to RN and Post -op Vital signs reviewed and stable  Post vital signs: Reviewed and stable  Last Vitals:  Vitals Value Taken Time  BP 91/69 02/28/23 0835  Temp 36.5 C 02/28/23 0835  Pulse 73 02/28/23 0835  Resp 19 02/28/23 0835  SpO2 97 % 02/28/23 0835    Last Pain:  Vitals:   02/28/23 0840  TempSrc:   PainSc: 0-No pain         Complications: No notable events documented.

## 2023-02-28 NOTE — Op Note (Signed)
[facility] Patient Name: Luis Joseph Procedure Date: 02/28/2023 8:17 AM MRN: [id number] Account #: [id number] Date of Birth: 1956/05/15 Admit Type: Outpatient Age: 67 Room: [facility] ENDO ROOM 2 Gender: Male Note Status: Finalized Instrument Name: Prentice Docker [id number] Procedure:             Colonoscopy Indications:           Screening for colorectal malignant neoplasm Providers:             Wyline Mood MD, MD Referring MD:          Dwana Curd. Para March, MD (Referring MD) Medicines:             Monitored Anesthesia Care Complications:         No immediate complications. Procedure:             Pre-Anesthesia Assessment:                        - Prior to the procedure, a History and Physical was                         performed, and patient medications, allergies and                         sensitivities were reviewed. The patient's tolerance                         of previous anesthesia was reviewed.                        - The risks and benefits of the procedure and the                         sedation options and risks were discussed with the                         patient. All questions were answered and informed                         consent was obtained.                        - ASA Grade Assessment: II - A patient with mild                         systemic disease.                        After obtaining informed consent, the colonoscope was                         passed under direct vision. Throughout the procedure,                         the patient's blood pressure, pulse, and oxygen                         saturations were monitored continuously. The                         Colonoscope was  introduced through the anus and                         advanced to the the cecum, identified by the                         appendiceal orifice. The colonoscopy was performed                         with ease. The patient tolerated the procedure well.                          The quality of the bowel preparation was good. The                         ileocecal valve, appendiceal orifice, and rectum were                         photographed. Findings:      The perianal and digital rectal examinations were normal.      Multiple medium-mouthed diverticula were found in the left colon.      The exam was otherwise without abnormality on direct and retroflexion       views. Impression:            - Diverticulosis in the left colon.                        - The examination was otherwise normal on direct and                         retroflexion views.                        - No specimens collected. Recommendation:        - Discharge patient to home (with escort).                        - Resume previous diet.                        - Continue present medications.                        - Repeat colonoscopy is not recommended due to current                         age (26 years or older) for screening purposes. Procedure Code(s):     --- Professional ---                        [phone], Colonoscopy, flexible; diagnostic, including                         collection of specimen(s) by brushing or washing, when                         performed (separate procedure) Diagnosis Code(s):     --- Professional ---  Z12.11, Encounter for screening for malignant neoplasm                         of colon                        K57.30, Diverticulosis of large intestine without                         perforation or abscess without bleeding CPT copyright 2022 American Medical Association. All rights reserved. The codes documented in this report are preliminary and upon coder review may  be revised to meet current compliance requirements. Wyline Mood, MD Wyline Mood MD, MD 02/28/2023 8:33:54 AM This report has been signed electronically. Number of Addenda: 0 Note Initiated On: 02/28/2023 8:17 AM Scope Withdrawal Time: 0 hours 7 minutes  40 seconds  Total Procedure Duration: 0 hours 10 minutes 47 seconds  Estimated Blood Loss:  Estimated blood loss: none.      [facility]

## 2023-02-28 NOTE — Anesthesia Procedure Notes (Signed)
Procedure Name: General with mask airway Date/Time: 02/28/2023 8:21 AM  Performed by: Mohammed Kindle, CRNAPre-anesthesia Checklist: Patient identified, Emergency Drugs available, Suction available and Patient being monitored Patient Re-evaluated:Patient Re-evaluated prior to induction Oxygen Delivery Method: Simple face mask Induction Type: IV induction Placement Confirmation: positive ETCO2, CO2 detector and breath sounds checked- equal and bilateral Dental Injury: Teeth and Oropharynx as per pre-operative assessment
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
## 2023-03-01 ENCOUNTER — Encounter: Payer: Self-pay | Admitting: Gastroenterology

## 2023-03-02 ENCOUNTER — Other Ambulatory Visit: Payer: Self-pay | Admitting: Internal Medicine

## 2023-03-04 ENCOUNTER — Telehealth: Payer: Self-pay | Admitting: Family Medicine

## 2023-03-04 ENCOUNTER — Other Ambulatory Visit: Payer: Self-pay | Admitting: Family Medicine

## 2023-03-04 NOTE — Telephone Encounter (Signed)
Patient called in and stated that he would like a phone call to discuss one of his medications and the cost. Thank you!

## 2023-03-06 ENCOUNTER — Other Ambulatory Visit: Payer: Self-pay | Admitting: Pharmacist

## 2023-03-06 NOTE — Progress Notes (Signed)
Patient called clinic requesting call back regarding medication cost.   Spoke to patient this morning. He reported that he called the pharmacy this week for a refill on his Jardiance. Previously $0 copay, though he was told copay is now $90/month.   Patient does not have prescription drug coverage (only Medicare A/B) and therefore has been using a drug copay card to reduce the cost of Jardiance.   Upon running copay card, his copay was successfully reduced to $0.   Advised him to reach back out if the copay card gives him any issues on future refills.   Loree Fee, PharmD Clinical Pharmacist [facility] [phone]

## 2023-03-08 NOTE — Anesthesia Postprocedure Evaluation (Signed)
Anesthesia Post Note  Patient: ALOISE SHELLMAN  Procedure(s) Performed: COLONOSCOPY WITH PROPOFOL  Patient location during evaluation: PACU Anesthesia Type: General Level of consciousness: awake and alert Pain management: pain level controlled Vital Signs Assessment: post-procedure vital signs reviewed and stable Respiratory status: spontaneous breathing, nonlabored ventilation, respiratory function stable and patient connected to nasal cannula oxygen Cardiovascular status: blood pressure returned to baseline and stable Postop Assessment: no apparent nausea or vomiting Anesthetic complications: no   No notable events documented.   Last Vitals:  Vitals:   02/28/23 0845 02/28/23 0855  BP: 106/79 123/81  Pulse: 72   Resp: 19   Temp:    SpO2:      Last Pain:  Vitals:   03/01/23 0741  TempSrc:   PainSc: 0-No pain                 Yevette Edwards

## 2023-04-20 ENCOUNTER — Other Ambulatory Visit: Payer: Self-pay | Admitting: Family Medicine

## 2023-05-14 ENCOUNTER — Other Ambulatory Visit: Payer: Self-pay | Admitting: Family Medicine

## 2023-05-31 ENCOUNTER — Other Ambulatory Visit: Payer: Self-pay | Admitting: Internal Medicine

## 2023-05-31 LAB — LIPID PANEL
Chol/HDL Ratio: 1.8 {ratio} (ref 0.0–5.0)
Cholesterol, Total: 98 mg/dL — ABNORMAL LOW (ref 100–199)
HDL: 55 mg/dL (ref >39)
LDL Chol Calc (NIH): 26 mg/dL (ref 0–99)
Triglycerides: 82 mg/dL (ref 0–149)
VLDL Cholesterol Cal: 17 mg/dL (ref 5–40)

## 2023-05-31 LAB — HEMOGLOBIN A1C
Est. average glucose Bld gHb Est-mCnc: 154 mg/dL
Hgb A1c MFr Bld: 7 % — ABNORMAL HIGH (ref 4.8–5.6)

## 2023-05-31 LAB — URIC ACID: Uric Acid: 4.2 mg/dL (ref 3.8–8.4)

## 2023-06-04 ENCOUNTER — Telehealth: Payer: Self-pay

## 2023-06-04 NOTE — Telephone Encounter (Signed)
 Copied from CRM [id number]. Topic: Clinical - Prescription Issue >> Jun 04, 2023  9:32 AM Luis Joseph wrote: Reason for CRM: Patient would like to speak with the pharmacist in regard to the price of dapagliflozin propanediol (FARXIGA) 10 MG TABS tablet.

## 2023-06-05 ENCOUNTER — Encounter: Payer: Self-pay | Admitting: Pharmacist

## 2023-06-06 ENCOUNTER — Ambulatory Visit (INDEPENDENT_AMBULATORY_CARE_PROVIDER_SITE_OTHER): Payer: Medicare Other | Admitting: Family Medicine

## 2023-06-06 ENCOUNTER — Telehealth: Payer: Self-pay

## 2023-06-06 ENCOUNTER — Encounter: Payer: Self-pay | Admitting: Family Medicine

## 2023-06-06 VITALS — BP 132/80 | HR 80 | Temp 98.5°F | Ht 71.0 in | Wt 221.4 lb

## 2023-06-06 DIAGNOSIS — M109 Gout, unspecified: Secondary | ICD-10-CM | POA: Diagnosis not present

## 2023-06-06 DIAGNOSIS — I10 Essential (primary) hypertension: Secondary | ICD-10-CM | POA: Diagnosis not present

## 2023-06-06 DIAGNOSIS — E1122 Type 2 diabetes mellitus with diabetic chronic kidney disease: Secondary | ICD-10-CM | POA: Diagnosis not present

## 2023-06-06 DIAGNOSIS — Z23 Encounter for immunization: Secondary | ICD-10-CM

## 2023-06-06 DIAGNOSIS — N1832 Chronic kidney disease, stage 3b: Secondary | ICD-10-CM

## 2023-06-06 DIAGNOSIS — Z7984 Long term (current) use of oral hypoglycemic drugs: Secondary | ICD-10-CM

## 2023-06-06 NOTE — Progress Notes (Signed)
Diabetes:  Using medications without difficulties: yes Hypoglycemic episodes: no sx Hyperglycemic episodes: no sx  Feet problems: no Blood Sugars averaging: not checked.  eye exam within last year: yesSo [facility].  [address] [place], [place] [address] [phone] A1c 7.    Flu shot today.    He had been off farxiga.  Out for the last 4 days.  Most recently rx would have been $140.  He called [facility] about options.  Pharmacy is   Sinusitis sx improved in the meantime, finishing augmentin.    Uric acid controlled, still on allopurinol 300mg  per day w/o gout flares.    He has renal follow up today.  Labs d/w pt.    He is off amlodipine in the meantime.  BP reasonable at the OV.    PMH and SH reviewed  Meds, vitals, and allergies reviewed.   ROS: Per HPI unless specifically indicated in ROS section   GEN: nad, alert and oriented HEENT: ncat NECK: supple w/o LA CV: rrr. PULM: ctab, no inc wob ABD: soft, +bs EXT: no edema SKIN: no acute rash

## 2023-06-06 NOTE — Telephone Encounter (Signed)
Thanks Dr. Tobi Bastos.  Pt has been advised of this and I will route to his PCP as well.   Luis Joseph, CMA ===View-only below this line=== ----- Message ----- From: Wyline Mood, MD Sent: 06/05/2023   8:25 PM EST To: Avie Arenas, CMA Subject: RE: Please advise on repeat colonoscopy.       Reviewed chart not find any reason for yearly colonoscopy indication.  Based on his last normal colonoscopy and the one was normal prior to that I do not see a reason to repeat it.  If himself or his primary care physician feels there is another reason to do it we can discuss it further ----- Message ----- From: Avie Arenas, CMA Sent: 06/05/2023   4:59 PM EST To: Wyline Mood, MD Subject: Please advise on repeat colonoscopy.           Dr. Tobi Bastos,  Please advise on repeat colonoscopy.  He has received them yearly.  But in your procedure note dated 02/28/23 you noted not recommended due to the current age at the time he was 89. No polyps were removed, however diverticulitis was noted.  Thanks for advising.  Luis Joseph, CMA

## 2023-06-06 NOTE — Patient Instructions (Addendum)
Call Loree Fee, PharmD Clinical Pharmacist [facility] Direct office Line: [phone]  See about options to replace farxiga.  Flu shot today.  Take care.  Glad to see you. Recheck in about 6 months.  Labs ahead of time.  Update me as needed.

## 2023-06-09 NOTE — Assessment & Plan Note (Signed)
No recent flares.  Continue allopurinol as is.

## 2023-06-09 NOTE — Assessment & Plan Note (Signed)
Discussed with patient, with renal follow-up pending.

## 2023-06-09 NOTE — Assessment & Plan Note (Signed)
Continue off amlodipine in the meantime.  Blood pressure is reasonably well-controlled.  Continue Lasix and metoprolol.  Continue telmisartan.

## 2023-06-09 NOTE — Assessment & Plan Note (Signed)
A1c 7.    I asked him to call Loree Fee, PharmD to see about options to replace farxiga and to get help with coverage.  Flu shot today.  Recheck in about 6 months.  Labs ahead of time.  Update me as needed.   Continue semaglutide in the meantime.  Continue work on diet and exercise.

## 2023-08-27 ENCOUNTER — Other Ambulatory Visit: Payer: Self-pay | Admitting: Family Medicine

## 2023-08-28 ENCOUNTER — Other Ambulatory Visit: Payer: Self-pay

## 2023-08-28 MED ORDER — ALLOPURINOL 300 MG PO TABS: 300.0000 mg | ORAL_TABLET | Freq: Every day | ORAL | 1 refills | Status: DC

## 2023-08-28 MED ORDER — TELMISARTAN 80 MG PO TABS: 80.0000 mg | ORAL_TABLET | Freq: Every day | ORAL | 3 refills | Status: AC

## 2023-09-12 ENCOUNTER — Telehealth: Payer: Self-pay

## 2023-09-12 NOTE — Telephone Encounter (Signed)
 Form done. Please send.

## 2023-09-12 NOTE — Telephone Encounter (Signed)
 Copied from CRM [id number]. Topic: Medical Record Request - Provider/Facility Request >> Sep 11, 2023  3:08 PM [place] J wrote: Reason for CRM: Representative from [employer] calling in regarding a fax they sent out on 09/04/2023. They are in need of information that they listed on the paper work and was wondering about the turn around time on that. They did sent in another fax on the 21st of April.  Callback: [phone]

## 2023-09-13 NOTE — Telephone Encounter (Signed)
 Returned call to [facility] and advised that paperwork has been faxed. They will follow up in a few days if they have not received

## 2023-10-10 ENCOUNTER — Other Ambulatory Visit: Payer: Self-pay | Admitting: Family Medicine

## 2023-11-08 ENCOUNTER — Other Ambulatory Visit: Payer: Self-pay | Admitting: Family Medicine

## 2023-12-05 ENCOUNTER — Ambulatory Visit: Admitting: Family Medicine

## 2023-12-12 ENCOUNTER — Telehealth: Payer: Self-pay

## 2023-12-12 DIAGNOSIS — E1122 Type 2 diabetes mellitus with diabetic chronic kidney disease: Secondary | ICD-10-CM

## 2023-12-12 DIAGNOSIS — I10 Essential (primary) hypertension: Secondary | ICD-10-CM

## 2023-12-12 NOTE — Telephone Encounter (Signed)
 Please place order for labs. Patient is aware that the provider is not in office but is insisting that orders be placed.    Copied from CRM [id number]. Topic: Clinical - Request for Lab/Test Order >> Dec 12, 2023  2:34 PM Mercedes MATSU wrote: Reason for CRM: Patient is requesting Dr. Cleatus order labs before his upcoming appointment so that way the results are back in time for the appointment he has scheduled. Per his chart notes, Dr. Cleatus is out of town and there are no pending lab orders so patient is unable to schedule lab appointment. Patient is upset and states he wants another doctor to order the labs since Dr. Cleatus is not in the office this week. Patient is asking for a call back and can be reached at [phone].

## 2023-12-12 NOTE — Telephone Encounter (Signed)
 Left voicemail for patient to return call to office.  If patient return call please advise that Dr. Cleatus is not in office and will not return until next week. Once he returns we can get the labs ordered or he can wait and have labs drawn at his visit.

## 2023-12-12 NOTE — Telephone Encounter (Signed)
 Copied from CRM #[id number]. Topic: Clinical - Request for Lab/Test Order >> Dec 12, 2023  8:00 AM Franky GRADE wrote: Reason for CRM: Patient is scheduled to see Dr.Duncan on 12/19/2023 and is currently at [facility] [address], [place], [place] [address] for blood work prior to the appointment; however, there are no active orders. [facility]ax number to [facility] is [phone].

## 2023-12-15 NOTE — Addendum Note (Signed)
 Addended by: CLEATUS ARLYSS RAMAN on: 12/15/2023 04:48 PM   Modules accepted: Orders

## 2023-12-15 NOTE — Telephone Encounter (Signed)
 I put in the orders.  If he is able to get the blood drawn between now and the office visit we should be able to have the results back.  Thanks.

## 2023-12-16 NOTE — Telephone Encounter (Signed)
 Spoke with patient to advise that lab orders have been faxed to [facility]

## 2023-12-18 ENCOUNTER — Ambulatory Visit: Payer: Self-pay | Admitting: Family Medicine

## 2023-12-18 LAB — COMPREHENSIVE METABOLIC PANEL WITH GFR
ALT: 126 IU/L — ABNORMAL HIGH (ref 0–44)
AST: 90 IU/L — ABNORMAL HIGH (ref 0–40)
Albumin: 3.9 g/dL (ref 3.9–4.9)
Alkaline Phosphatase: 125 IU/L — ABNORMAL HIGH (ref 44–121)
BUN/Creatinine Ratio: 23 (ref 10–24)
BUN: 68 mg/dL — ABNORMAL HIGH (ref 8–27)
Bilirubin Total: 0.5 mg/dL (ref 0.0–1.2)
CO2: 16 mmol/L — ABNORMAL LOW (ref 20–29)
Calcium: 8.9 mg/dL (ref 8.6–10.2)
Chloride: 107 mmol/L — ABNORMAL HIGH (ref 96–106)
Creatinine, Ser: 3.01 mg/dL — ABNORMAL HIGH (ref 0.76–1.27)
Globulin, Total: 2.6 g/dL (ref 1.5–4.5)
Glucose: 170 mg/dL — ABNORMAL HIGH (ref 70–99)
Potassium: 4.7 mmol/L (ref 3.5–5.2)
Sodium: 138 mmol/L (ref 134–144)
Total Protein: 6.5 g/dL (ref 6.0–8.5)
eGFR: 22 mL/min/1.73 — ABNORMAL LOW (ref >59)

## 2023-12-18 LAB — CBC WITH DIFFERENTIAL/PLATELET
Basophils Absolute: 0.1 x10E3/uL (ref 0.0–0.2)
Basos: 1 %
EOS (ABSOLUTE): 0.3 x10E3/uL (ref 0.0–0.4)
Eos: 4 %
Hematocrit: 40.9 % (ref 37.5–51.0)
Hemoglobin: 13.3 g/dL (ref 13.0–17.7)
Immature Grans (Abs): 0 x10E3/uL (ref 0.0–0.1)
Immature Granulocytes: 0 %
Lymphocytes Absolute: 2.7 x10E3/uL (ref 0.7–3.1)
Lymphs: 36 %
MCH: 33.2 pg — ABNORMAL HIGH (ref 26.6–33.0)
MCHC: 32.5 g/dL (ref 31.5–35.7)
MCV: 102 fL — ABNORMAL HIGH (ref 79–97)
Monocytes Absolute: 0.7 x10E3/uL (ref 0.1–0.9)
Monocytes: 9 %
Neutrophils Absolute: 3.8 x10E3/uL (ref 1.4–7.0)
Neutrophils: 50 %
Platelets: 184 x10E3/uL (ref 150–450)
RBC: 4.01 x10E6/uL — ABNORMAL LOW (ref 4.14–5.80)
RDW: 14.4 % (ref 11.6–15.4)
WBC: 7.6 x10E3/uL (ref 3.4–10.8)

## 2023-12-18 LAB — LIPID PANEL
Chol/HDL Ratio: 2.1 ratio (ref 0.0–5.0)
Cholesterol, Total: 96 mg/dL — ABNORMAL LOW (ref 100–199)
HDL: 45 mg/dL (ref >39)
LDL Chol Calc (NIH): 29 mg/dL (ref 0–99)
Triglycerides: 126 mg/dL (ref 0–149)
VLDL Cholesterol Cal: 22 mg/dL (ref 5–40)

## 2023-12-18 LAB — MICROALBUMIN / CREATININE URINE RATIO
Creatinine, Urine: 52.4 mg/dL
Microalb/Creat Ratio: 1898 mg/g{creat} — ABNORMAL HIGH (ref 0–29)
Microalbumin, Urine: 994.4 ug/mL

## 2023-12-18 LAB — HEMOGLOBIN A1C
Est. average glucose Bld gHb Est-mCnc: 166 mg/dL
Hgb A1c MFr Bld: 7.4 % — ABNORMAL HIGH (ref 4.8–5.6)

## 2023-12-18 LAB — URIC ACID: Uric Acid: 4.5 mg/dL (ref 3.8–8.4)

## 2023-12-19 ENCOUNTER — Encounter: Payer: Self-pay | Admitting: Family Medicine

## 2023-12-19 ENCOUNTER — Ambulatory Visit (INDEPENDENT_AMBULATORY_CARE_PROVIDER_SITE_OTHER): Admitting: Family Medicine

## 2023-12-19 VITALS — BP 126/74 | HR 75 | Temp 98.5°F | Ht 71.0 in | Wt 218.6 lb

## 2023-12-19 DIAGNOSIS — Z8249 Family history of ischemic heart disease and other diseases of the circulatory system: Secondary | ICD-10-CM

## 2023-12-19 DIAGNOSIS — I10 Essential (primary) hypertension: Secondary | ICD-10-CM

## 2023-12-19 DIAGNOSIS — Z7985 Long-term (current) use of injectable non-insulin antidiabetic drugs: Secondary | ICD-10-CM

## 2023-12-19 DIAGNOSIS — E1122 Type 2 diabetes mellitus with diabetic chronic kidney disease: Secondary | ICD-10-CM | POA: Diagnosis not present

## 2023-12-19 DIAGNOSIS — R7989 Other specified abnormal findings of blood chemistry: Secondary | ICD-10-CM | POA: Diagnosis not present

## 2023-12-19 DIAGNOSIS — H8110 Benign paroxysmal vertigo, unspecified ear: Secondary | ICD-10-CM

## 2023-12-19 DIAGNOSIS — N1832 Chronic kidney disease, stage 3b: Secondary | ICD-10-CM

## 2023-12-19 MED ORDER — AMLODIPINE BESYLATE 2.5 MG PO TABS: 2.5000 mg | ORAL_TABLET | Freq: Two times a day (BID) | ORAL | Status: DC

## 2023-12-19 MED ORDER — AMLODIPINE BESYLATE 2.5 MG PO TABS: 2.5000 mg | ORAL_TABLET | Freq: Two times a day (BID) | ORAL | 3 refills | Status: AC

## 2023-12-19 NOTE — Progress Notes (Signed)
 CKD.  Saw renal today.  Creatinine discussed with patient.  Avoiding NSAIDs.  Awaiting renal consult note.  BP has been ~140 systolic in the evenings.  Had been taking amlodipine  2.5mg  every day.  D/w pt about trying amlodipine  2.5mg  BID.    DM2.  He was asking about zepbound vs ozempic .  Currently on ozempic .  LFT elevation.  Normal bilirubin.  No abdominal pain or jaundice.  Sugar had been ~ 100 in the AM, higher later in the day.    Some fatigue noted.    No CP.  D/w pt about calcium  scoring, rationale for use.  Ordered.  He has vertigo with laying down, for about 10 seconds.  Does not have symptoms upon standing.  He has brief episodes that are recurrent and positional where the room can spin, with laying down.  No symptoms currently.  Joint pain, d/w pt about parking sticker.  Form done at office visit.  He can check to see if he can use the [place]  form in [place] .  He can update me as needed.  Meds, vitals, and allergies reviewed.   ROS: Per HPI unless specifically indicated in ROS section   GEN: nad, alert and oriented HEENT: ncat NECK: supple w/o LA CV: rrr. PULM: ctab, no inc wob ABD: soft, +bs, nontender.  No jaundice. EXT: no edema SKIN: well perfused.  TM wnl B No overt symptoms with head turning.

## 2023-12-19 NOTE — Patient Instructions (Addendum)
 Please update me 2 weeks before appointments and I can put in the labs for [facility].  Plan on recheck in 6 months, depending on your ultrasound.   Let me know if you can't get scheduled for ultrasound and the CT.  Take care.  Glad to see you.  Try taking amlodipine  2.5mg  twice a day and update me if that isn't working.   Try the bedside vertigo exercise.  Let me know if that isn't helping.

## 2023-12-22 DIAGNOSIS — H8110 Benign paroxysmal vertigo, unspecified ear: Secondary | ICD-10-CM | POA: Insufficient documentation

## 2023-12-22 NOTE — Assessment & Plan Note (Signed)
 BP has been ~140 systolic in the evenings.  Had been taking amlodipine  2.5mg  every day.  D/w pt about trying amlodipine  2.5mg  BID.    No CP.  D/w pt about calcium  scoring, rationale for use.  Ordered.

## 2023-12-22 NOTE — Assessment & Plan Note (Signed)
 I asked him to update me 2 weeks before appointments and I can put in the labs for [facility].  Plan on recheck in 6 months, depending on his ultrasound re: LFTs.  Would not change medications yet.  I think it makes sense to continue Ozempic  for now and not change to Zepbound.  Continue work on diet and exercise.

## 2023-12-22 NOTE — Assessment & Plan Note (Signed)
 Anatomy discussed with patient.  He can use bedside exercise and update me as needed.  BPV pathophysiology discussed with patient.

## 2023-12-22 NOTE — Assessment & Plan Note (Signed)
 Saw renal today.  Creatinine discussed with patient.  Avoiding NSAIDs.  Awaiting renal consult note.

## 2023-12-31 ENCOUNTER — Other Ambulatory Visit: Payer: Self-pay | Admitting: Family Medicine

## 2024-01-02 ENCOUNTER — Ambulatory Visit (INDEPENDENT_AMBULATORY_CARE_PROVIDER_SITE_OTHER): Payer: Medicare Other

## 2024-01-02 VITALS — Ht 71.0 in | Wt 218.0 lb

## 2024-01-02 DIAGNOSIS — Z0000 Encounter for general adult medical examination without abnormal findings: Secondary | ICD-10-CM | POA: Diagnosis not present
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
## 2024-01-02 NOTE — Patient Instructions (Signed)
 Luis Joseph , Thank you for taking time out of your busy schedule to complete your Annual Wellness Visit with me. I enjoyed our conversation and look forward to speaking with you again next year. I, as well as your care team,  appreciate your ongoing commitment to your health goals. Please review the following plan we discussed and let me know if I can assist you in the future. Your Game plan/ To Do List    Referrals: If you haven't heard from the office you've been referred to, please reach out to them at the phone provided.   Follow up Visits: We will see or speak with you next year for your Next Medicare AWV with our clinical staff-01/04/25 @3 :40PM TELEVISIT Have you seen your provider in the last 6 months (3 months if uncontrolled diabetes)? Yes  Clinician Recommendations:  Aim for 30 minutes of exercise or brisk walking, 6-8 glasses of water , and 5 servings of fruits and vegetables each day.       This is a list of the screenings recommended for you:  Health Maintenance  Topic Date Due   Pneumococcal Vaccine for age over 62 (3 of 3 - PCV) 12/31/2020   Eye exam for diabetics  08/09/2022   COVID-19 Vaccine (5 - 2024-25 season) 01/20/2023   Complete foot exam   11/01/2023   Flu Shot  12/20/2023   Colon Cancer Screening  02/28/2024   Hemoglobin A1C  06/18/2024   Yearly kidney function blood test for diabetes  12/16/2024   Yearly kidney health urinalysis for diabetes  12/16/2024   Medicare Annual Wellness Visit  01/01/2025   DTaP/Tdap/Td vaccine (4 - Td or Tdap) 01/01/2033   Hepatitis C Screening  Completed   Zoster (Shingles) Vaccine  Completed   HPV Vaccine  Aged Out   Meningitis B Vaccine  Aged Out   Pneumococcal Vaccine  Discontinued    Advanced directives: (ACP Link)Information on Advanced Care Planning can be found at [place]  Secretary of State Advance Health Care Directives Advance Health Care Directives. [email or web address]   PT SAYS HE IS MEETING WITH LAWYER NOW TO COMPLETE  THIS  Advance Care Planning is important because it:  [x]  Makes sure you receive the medical care that is consistent with your values, goals, and preferences  [x]  It provides guidance to your family and loved ones and reduces their decisional burden about whether or not they are making the right decisions based on your wishes.  Follow the link provided in your after visit summary or read over the paperwork we have mailed to you to help you started getting your Advance Directives in place. If you need assistance in completing these, please reach out to us  so that we can help you!

## 2024-01-08 ENCOUNTER — Ambulatory Visit (HOSPITAL_BASED_OUTPATIENT_CLINIC_OR_DEPARTMENT_OTHER)
Admission: RE | Admit: 2024-01-08 | Discharge: 2024-01-08 | Disposition: A | Discharge status: Home / Self Care | Payer: Self-pay | Source: Ambulatory Visit | Attending: Family Medicine | Admitting: Family Medicine

## 2024-01-08 ENCOUNTER — Ambulatory Visit: Payer: Self-pay | Admitting: Family Medicine

## 2024-01-08 ENCOUNTER — Ambulatory Visit (HOSPITAL_BASED_OUTPATIENT_CLINIC_OR_DEPARTMENT_OTHER)
Admission: RE | Admit: 2024-01-08 | Discharge: 2024-01-08 | Disposition: A | Discharge status: Home / Self Care | Source: Ambulatory Visit | Attending: Family Medicine | Admitting: Family Medicine

## 2024-01-08 DIAGNOSIS — Z8249 Family history of ischemic heart disease and other diseases of the circulatory system: Secondary | ICD-10-CM | POA: Diagnosis present

## 2024-01-08 DIAGNOSIS — R7989 Other specified abnormal findings of blood chemistry: Secondary | ICD-10-CM | POA: Diagnosis present

## 2024-01-08 DIAGNOSIS — I2510 Atherosclerotic heart disease of native coronary artery without angina pectoris: Secondary | ICD-10-CM

## 2024-01-10 ENCOUNTER — Other Ambulatory Visit: Payer: Self-pay | Admitting: Family Medicine

## 2024-01-15 ENCOUNTER — Other Ambulatory Visit: Payer: Self-pay | Admitting: Family Medicine

## 2024-01-15 ENCOUNTER — Telehealth: Payer: Self-pay | Admitting: Family Medicine

## 2024-01-15 DIAGNOSIS — R7989 Other specified abnormal findings of blood chemistry: Secondary | ICD-10-CM

## 2024-01-15 NOTE — Telephone Encounter (Signed)
 See result note.

## 2024-01-15 NOTE — Telephone Encounter (Signed)
 Results are ready if you discuss [phone] Ultrasound abdomen was completed 01/14/24 if can let them know that you have received results

## 2024-01-17 NOTE — Telephone Encounter (Signed)
 Left message to call office.

## 2024-01-17 NOTE — Telephone Encounter (Signed)
 Copied from CRM [id number]. Topic: General - Other >> Jan 17, 2024  4:00 PM Paige D wrote: Reason for CRM: Pt called in regards to a call yesterday pt would like  a call back. I stated to pt that would not be until Tuesday.

## 2024-01-21 NOTE — Progress Notes (Unsigned)
 Cardiology Office Note   Date:  01/23/2024  ID:  Dace, Denn 1956-05-17, MRN [id number] PCP: Cleatus Arlyss RAMAN, MD  [facility] Providers Cardiologist:  Stanly DELENA Leavens, MD   History of Present Illness Luis Joseph is a 68 y.o. male with a past medical history of HTN, HLD, aortic atherosclerosis, CKD stage IIIa who originally presented back in 2022 after a stress test.  Patient noted chest pain and pressure at work during this event in 2022.  DOD called 9/22 for chest pain, had a NM stress, short run of SVT without perfusion defect.  Found to have new lower extremity edema and started on a diuretic.  Then in 2023 was started on Ozempic  for weight loss.   Was seen in October 2024 and reported no chest pain or abnormal heartbeats.  Was on metoprolol  and had recently been taken off amlodipine  by his kidney doctor due to well-controlled blood pressure.  Had lost a significant amount of weight dropping from 250 pounds 2023 to 205 pounds.  On Ozempic  for weight loss.  Has not had any recent cholesterol checks.  Repeated a recent bout of bronchitis at that time and COVID-19 during which he lost an additional 8 to 10 pounds.  He has been working and recently returned from a trip to [place].  Has no other new symptoms or concerns.  Was on high dose metoprolol .  Today, he presents with coronary artery disease for follow-up after a CT scan showing significant coronary artery calcification. He was referred by Dr. Cleatus for evaluation of significant coronary artery calcification.  A recent CT scan shows significant coronary artery calcification with a calcium  score of 1878, placing him in the 94th percentile for his age, race, and sex. The highest score is in the left anterior descending artery at 778, with additional calcification in the left circumflex and right coronary arteries. He experiences decreased stamina over the past year, affecting activities such as mowing the  lawn, but denies shortness of breath.  He has hypertension, managed with metoprolol  100 mg daily and amlodipine  2.5 mg daily. Amlodipine  was previously discontinued due to low blood pressure but has been resumed. He occasionally experiences dizziness upon standing, which he attributes to not eating.  He has diabetes with an A1c of 7.4, which he attributes to dietary choices. He has been under 7 once or twice in the past 10-12 years and is making efforts to manage it.  There is a family history of heart disease; his mother had a triple bypass at 43 and passed away shortly after, and his father also had heart disease, prompting the CT scan.  Reports no shortness of breath nor dyspnea on exertion. Reports no chest pain, pressure, or tightness. No edema, orthopnea, PND. Reports no palpitations.   Discussed the use of AI scribe software for clinical note transcription with the patient, who gave verbal consent to proceed.   ROS: pertinent ROS in HPI  Studies Reviewed      STRESS TESTS   MYOCARDIAL PERFUSION IMAGING 02/20/2021   Narrative   The study is normal. The study is low risk.   No ST deviation was noted.   Left ventricular function is normal. End diastolic cavity size is normal. End systolic cavity size is normal.   Prior study not available for comparison.   Findings Post stress PVCs, PACs, and short run of SVT. No evidence of ischemia or infarction.   Conclusions: Stress test is negative, low risk study.  ECHOCARDIOGRAM   ECHOCARDIOGRAM COMPLETE 05/17/2021   Narrative ECHOCARDIOGRAM REPORT       Patient Name:   Luis Joseph Date of Exam: 05/17/2021 Medical Rec #:  [id number]       Height:       70.0 in Accession #:    [id number]      Weight:       252.0 lb Date of Birth:  01/30/1956        BSA:          2.303 m Patient Age:    65 years        BP:           124/82 mmHg Patient Gender: M               HR:           69 bpm. Exam Location:  [address]    Procedure: 2D Echo, 3D Echo, Cardiac Doppler and Color Doppler   Indications:    R06.09 Dyspnea R60.0 Edema   History:        Patient has no prior history of Echocardiogram examinations. Signs/Symptoms:Dyspnea and Edema; Risk Factors:Family History of Coronary Artery Disease, Hypertension, Diabetes, Dyslipidemia and Former Smoker. Chronic Kidney Disease.   Sonographer:    Heather Hawks RDCS Referring Phys: ARLYSS GORMAN SOLIAN   IMPRESSIONS     1. Left ventricular ejection fraction, by estimation, is 60 to 65%. Left ventricular ejection fraction by 3D volume is 64 %. The left ventricle has normal function. The left ventricle has no regional wall motion abnormalities. Left ventricular diastolic parameters are consistent with Grade I diastolic dysfunction (impaired relaxation). 2. Right ventricular systolic function is normal. The right ventricular size is mildly enlarged. There is normal pulmonary artery systolic pressure. 3. Left atrial size was mildly dilated. 4. The mitral valve is normal in structure. No evidence of mitral valve regurgitation. No evidence of mitral stenosis. 5. The aortic valve is normal in structure. Aortic valve regurgitation is mild. No aortic stenosis is present. Aortic regurgitation PHT measures 863 msec. 6. Aortic dilatation noted. There is borderline dilatation of the ascending aorta, measuring 38 mm. 7. The inferior vena cava is normal in size with greater than 50% respiratory variability, suggesting right atrial pressure of 3 mmHg.   Comparison(s): No prior Echocardiogram.   FINDINGS Left Ventricle: Left ventricular ejection fraction, by estimation, is 60 to 65%. Left ventricular ejection fraction by 3D volume is 64 %. The left ventricle has normal function. The left ventricle has no regional wall motion abnormalities. The left ventricular internal cavity size was normal in size. There is no left ventricular hypertrophy. Left ventricular diastolic parameters  are consistent with Grade I diastolic dysfunction (impaired relaxation).   Right Ventricle: The right ventricular size is mildly enlarged. No increase in right ventricular wall thickness. Right ventricular systolic function is normal. There is normal pulmonary artery systolic pressure. The tricuspid regurgitant velocity is 2.41 m/s, and with an assumed right atrial pressure of 3 mmHg, the estimated right ventricular systolic pressure is 26.2 mmHg.   Left Atrium: Left atrial size was mildly dilated.   Right Atrium: Right atrial size was normal in size.   Pericardium: There is no evidence of pericardial effusion.   Mitral Valve: The mitral valve is normal in structure. No evidence of mitral valve regurgitation. No evidence of mitral valve stenosis.   Tricuspid Valve: The tricuspid valve is normal in structure. Tricuspid valve regurgitation is trivial. No evidence of tricuspid stenosis.  Aortic Valve: The aortic valve is normal in structure. Aortic valve regurgitation is mild. Aortic regurgitation PHT measures 863 msec. No aortic stenosis is present.   Pulmonic Valve: The pulmonic valve was normal in structure. Pulmonic valve regurgitation is not visualized. No evidence of pulmonic stenosis.   Aorta: Aortic dilatation noted. There is borderline dilatation of the ascending aorta, measuring 38 mm.   Venous: The inferior vena cava is normal in size with greater than 50% respiratory variability, suggesting right atrial pressure of 3 mmHg.   IAS/Shunts: No atrial level shunt detected by color flow Doppler.     LEFT VENTRICLE PLAX 2D LVIDd:         4.45 cm         Diastology LVIDs:         2.00 cm         LV e' medial:    5.87 cm/s LV PW:         1.00 cm         LV E/e' medial:  8.0 LV IVS:        0.90 cm         LV e' lateral:   9.14 cm/s LVOT diam:     2.60 cm         LV E/e' lateral: 5.1 LV SV:         120 LV SV Index:   52 LVOT Area:     5.31 cm        3D Volume EF LV 3D EF:     Left ventricul ar ejection fraction by 3D volume is 64 %.   3D Volume EF: 3D EF:        64 % LV EDV:       144 ml LV ESV:       53 ml LV SV:        92 ml   RIGHT VENTRICLE RV S prime:     24.70 cm/s TAPSE (M-mode): 2.6 cm   LEFT ATRIUM             Index        RIGHT ATRIUM           Index LA diam:        4.75 cm 2.06 cm/m   RA Area:     17.60 cm LA Vol (A2C):   86.9 ml 37.74 ml/m  RA Volume:   57.60 ml  25.01 ml/m LA Vol (A4C):   81.5 ml 35.39 ml/m LA Biplane Vol: 86.5 ml 37.56 ml/m AORTIC VALVE LVOT Vmax:   113.80 cm/s LVOT Vmean:  73.480 cm/s LVOT VTI:    0.227 m AI PHT:      863 msec   AORTA Ao Root diam: 3.40 cm Ao Asc diam:  3.80 cm   MITRAL VALVE               TRICUSPID VALVE MV Area (PHT): cm         TR Peak grad:   23.2 mmHg MV Decel Time: 271 msec    TR Vmax:        241.00 cm/s MV E velocity: 46.90 cm/s MV A velocity: 83.60 cm/s  SHUNTS MV E/A ratio:  0.56        Systemic VTI:  0.23 m Systemic Diam: 2.60 cm   Kardie Tobb DO Electronically signed by Dub Huntsman DO Signature Date/Time: 05/17/2021/2:02:25 PM       Final  Physical Exam VS:  BP 104/80   Pulse 78   Ht 5' 11 (1.803 m)   Wt 219 lb 9.6 oz (99.6 kg)   SpO2 97%   BMI 30.63 kg/m        Wt Readings from Last 3 Encounters:  01/23/24 219 lb 9.6 oz (99.6 kg)  01/02/24 218 lb (98.9 kg)  12/19/23 218 lb 9.6 oz (99.2 kg)    GEN: Well nourished, well developed in no acute distress NECK: No JVD; No carotid bruits CARDIAC: RRR, no murmurs, rubs, gallops RESPIRATORY:  Clear to auscultation without rales, wheezing or rhonchi  ABDOMEN: Soft, non-tender, non-distended EXTREMITIES:  No edema; No deformity   ASSESSMENT AND PLAN  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 LDL cholesterol well-controlled at 29  mg/dL. Discussed potential stenting if symptomatic blockages occur. Echocardiogram recommended due to decreased stamina and family history. - Order echocardiogram to assess heart pump function and structural issues. - Recommend starting 81 mg daily aspirin  for coronary disease prevention. - Continue current medications for cholesterol management.  Type 2 diabetes mellitus with diabetic chronic kidney disease A1c at 7.4 indicates suboptimal glycemic control. Chronic kidney disease necessitates careful medication management to prevent renal complications. Dietary modifications discussed. - Encourage dietary modifications to lower A1c, such as choosing whole grains and low glycemic index foods.  Essential hypertension Blood pressure low-normal at 104/80 mmHg. Current regimen includes metoprolol  and amlodipine . No significant dizziness or lightheadedness reported. - Continue current antihypertensive regimen.  Mixed hyperlipidemia Lipid panel shows excellent control with LDL at 29 mg/dL. Current treatment effective. - Continue current lipid-lowering therapy.  Skin and soft tissue infection post-skin cancer excision Infection at excision site with redness and possible cellulitis. No fever or systemic symptoms. Discussed potential allergic reaction to nonstick gauze pads. - Prescribe 7-day course of Keflex , adjusting dose for renal function. - Advise keeping the wound clean and dry, avoiding Vaseline, and using unscented soap for cleaning. - Recommend covering the wound with light gauze and tape to prevent infection. - Instruct to monitor for spreading redness and systemic symptoms such as fever or chills.      Dispo: He can follow-up in 6 months with Dr. Santo  Signed, Orren LOISE Fabry, PA-C

## 2024-01-22 ENCOUNTER — Telehealth: Payer: Self-pay | Admitting: Family Medicine

## 2024-01-22 NOTE — Telephone Encounter (Signed)
 Copied from CRM #[id number]. Topic: General - Call Back - No Documentation >> Jan 21, 2024  4:10 PM Tiffany S wrote: Reason for CRM: Patient was returning kelly call please follow up with patient

## 2024-01-22 NOTE — Telephone Encounter (Signed)
 Closing encounter. Returned call to patient and reviewed lab results

## 2024-01-23 ENCOUNTER — Encounter: Payer: Self-pay | Admitting: Physician Assistant

## 2024-01-23 ENCOUNTER — Ambulatory Visit: Attending: Physician Assistant | Admitting: Physician Assistant

## 2024-01-23 VITALS — BP 104/80 | HR 78 | Ht 71.0 in | Wt 219.6 lb

## 2024-01-23 DIAGNOSIS — I10 Essential (primary) hypertension: Secondary | ICD-10-CM | POA: Insufficient documentation

## 2024-01-23 DIAGNOSIS — I471 Supraventricular tachycardia, unspecified: Secondary | ICD-10-CM | POA: Insufficient documentation

## 2024-01-23 DIAGNOSIS — R0609 Other forms of dyspnea: Secondary | ICD-10-CM | POA: Diagnosis present

## 2024-01-23 DIAGNOSIS — E782 Mixed hyperlipidemia: Secondary | ICD-10-CM | POA: Diagnosis present

## 2024-01-23 DIAGNOSIS — I700 Atherosclerosis of aorta: Secondary | ICD-10-CM | POA: Diagnosis present

## 2024-01-23 DIAGNOSIS — E1122 Type 2 diabetes mellitus with diabetic chronic kidney disease: Secondary | ICD-10-CM | POA: Diagnosis present

## 2024-01-23 MED ORDER — ASPIRIN 81 MG PO TBEC
81.0000 mg | DELAYED_RELEASE_TABLET | Freq: Every day | ORAL | 3 refills | Status: AC
Start: 2024-01-23 — End: ?

## 2024-01-23 MED ORDER — CEPHALEXIN 500 MG PO CAPS: 500.0000 mg | ORAL_CAPSULE | Freq: Two times a day (BID) | ORAL | 0 refills | Status: AC

## 2024-01-23 NOTE — Patient Instructions (Signed)
 Medication Instructions:   Start Aspirin  81 mg take one tablet daily *If you need a refill on your cardiac medications before your next appointment, please call your pharmacy*   Testing/Procedures: Your physician has requested that you have an echocardiogram. Echocardiography is a painless test that uses sound waves to create images of your heart. It provides your doctor with information about the size and shape of your heart and how well your heart's chambers and valves are working. This procedure takes approximately one hour. There are no restrictions for this procedure. Please do NOT wear cologne, perfume, aftershave, or lotions (deodorant is allowed). Please arrive 15 minutes prior to your appointment time.  Please note: We ask at that you not bring children with you during ultrasound (echo/ vascular) testing. Due to room size and safety concerns, children are not allowed in the ultrasound rooms during exams. Our front office staff cannot provide observation of children in our lobby area while testing is being conducted. An adult accompanying a patient to their appointment will only be allowed in the ultrasound room at the discretion of the ultrasound technician under special circumstances. We apologize for any inconvenience.   Follow-Up: At [facility], you and your health needs are our priority.  As part of our continuing mission to provide you with exceptional heart care, our providers are all part of one team.  This team includes your primary Cardiologist (physician) and Advanced Practice Providers or APPs (Physician Assistants and Nurse Practitioners) who all work together to provide you with the care you need, when you need it.  Your next appointment:   6 month(s)  Provider:   Stanly DELENA Leavens, MD

## 2024-02-19 ENCOUNTER — Other Ambulatory Visit: Payer: Self-pay | Admitting: Family Medicine

## 2024-02-19 ENCOUNTER — Other Ambulatory Visit: Payer: Self-pay | Admitting: Internal Medicine

## 2024-02-20 ENCOUNTER — Other Ambulatory Visit: Payer: Self-pay | Admitting: Family Medicine

## 2024-02-25 ENCOUNTER — Telehealth: Payer: Self-pay

## 2024-02-25 NOTE — Telephone Encounter (Signed)
 Copied from CRM [id number]. Topic: Clinical - Prescription Issue >> Feb 25, 2024  5:19 PM Alfonso HERO wrote: Reason for CRM: Vil from [employer] called stating they have been requesting chart notes stating the patient is using a glucose monitor and they haven't received anything. Please f/u with them at [phone] Fax [phone]

## 2024-02-26 ENCOUNTER — Ambulatory Visit (HOSPITAL_COMMUNITY)
Admission: RE | Admit: 2024-02-26 | Discharge: 2024-02-26 | Disposition: A | Discharge status: Home / Self Care | Source: Ambulatory Visit | Attending: Cardiology | Admitting: Cardiology

## 2024-02-26 DIAGNOSIS — R0609 Other forms of dyspnea: Secondary | ICD-10-CM | POA: Diagnosis present

## 2024-02-26 NOTE — Telephone Encounter (Signed)
 Reached out to [employer] and spoke with Onita. I have faxed this over several times. Resent fax today.

## 2024-02-27 LAB — ECHOCARDIOGRAM COMPLETE: S' Lateral: 3.18 cm

## 2024-02-28 ENCOUNTER — Ambulatory Visit: Payer: Self-pay | Admitting: Physician Assistant

## 2024-02-28 ENCOUNTER — Telehealth: Payer: Self-pay | Admitting: Physician Assistant

## 2024-02-28 NOTE — Telephone Encounter (Signed)
 Patient returning call in regards to echo results.

## 2024-02-28 NOTE — Telephone Encounter (Signed)
 Patient contacted by team member and echo results given:  Luis Joseph, [facility]   02/28/24  1:12 PM Result Note Patient returned my call and I gave him the results to the echo that was done. He had no questions. ECHOCARDIOGRAM COMPLETE

## 2024-04-11 ENCOUNTER — Other Ambulatory Visit: Payer: Self-pay | Admitting: Family Medicine

## 2024-04-11 DIAGNOSIS — E1122 Type 2 diabetes mellitus with diabetic chronic kidney disease: Secondary | ICD-10-CM

## 2024-04-13 NOTE — Telephone Encounter (Signed)
 Ozempic  Last filled:  03/16/24, #3 mL Last OV:  12/19/23, 6 mo f/u Next OV:  none

## 2024-04-24 ENCOUNTER — Other Ambulatory Visit: Payer: Self-pay | Admitting: Family Medicine

## 2024-04-24 DIAGNOSIS — I10 Essential (primary) hypertension: Secondary | ICD-10-CM

## 2024-04-24 NOTE — Telephone Encounter (Signed)
 E-[facility] [place], [place], [place].

## 2024-04-24 NOTE — Telephone Encounter (Signed)
 Copied from CRM [id number]. Topic: Clinical - Medication Refill >> Apr 24, 2024 10:15 AM Ashley R wrote: Medication:  metoprolol  succinate (TOPROL -XL) 100 MG 24 hr tablet   Has the patient contacted their pharmacy? Yes   This is the patient's preferred pharmacy:  [id number] [place] [address], [place] - [address] [place] [address] Phone: [phone] Fax: [phone]  Is this the correct pharmacy for this prescription? Yes If no, delete pharmacy and type the correct one.   Has the prescription been filled recently? Yes  Is the patient out of the medication? Yes  Has the patient been seen for an appointment in the last year OR does the patient have an upcoming appointment? Yes  Can we respond through MyChart? Yes  Agent: Please be advised that Rx refills may take up to 3 business days. We ask that you follow-up with your pharmacy.

## 2024-05-11 ENCOUNTER — Telehealth: Payer: Self-pay | Admitting: Family Medicine

## 2024-05-11 ENCOUNTER — Encounter: Payer: Self-pay | Admitting: *Deleted

## 2024-05-11 DIAGNOSIS — R399 Unspecified symptoms and signs involving the genitourinary system: Secondary | ICD-10-CM

## 2024-05-11 NOTE — Telephone Encounter (Signed)
 I put in the referral but please get the ER notes.  What was the condition?  How is he doing now?    Please let me know.  Thanks.

## 2024-05-11 NOTE — Telephone Encounter (Signed)
 Copied from CRM #[id number]. Topic: Referral - Request for Referral >> May 11, 2024 12:32 PM China J wrote: Did the patient discuss referral with their provider in the last year? Yes (If No - schedule appointment) (If Yes - send message)  Appointment offered? No  Type of order/referral and detailed reason for visit: Patient was hospitalized recently and was referred to a urologist at the ED but he prefers to be seen by a different clinic and doctor for this matter.  Preference of office, provider, location:  Redell Cross, MD [facility] [address] [place], [place] [address]  If referral order, have you been seen by this specialty before? Yes (If Yes, this issue or another issue? When? Where? Patient saw Dr. Cross years ago. Unsure of the reason.   Can we respond through MyChart? No Please call the patient's wife at [phone]

## 2024-05-11 NOTE — Addendum Note (Signed)
 Addended by: CLEATUS ARLYSS RAMAN on: 05/11/2024 03:36 PM   Modules accepted: Orders

## 2024-05-12 ENCOUNTER — Telehealth: Payer: Self-pay | Admitting: Family Medicine

## 2024-05-12 NOTE — Telephone Encounter (Signed)
Form done. Thanks. 

## 2024-05-12 NOTE — Telephone Encounter (Signed)
 Patient needs a handicap placecard for [place] completed since they reside there. Placed in providers box at front desk for completion

## 2024-05-12 NOTE — Telephone Encounter (Signed)
 Contacted and spoke with patient. Informed him of referral sent for urology.  Pt was seen for bleeding ulcer.  States he was throwing up blood,  Endoscopy was performed and states the problems were fixed.  Pt complains of feeling a lot better but still weak.

## 2024-05-12 NOTE — Telephone Encounter (Signed)
 I am glad he is feeling better.   I have looked in the EMR for the reports.  I can't see them.  Please request the reports.    What urinary symptom led to the urology referral?

## 2024-05-12 NOTE — Telephone Encounter (Signed)
 I will work on [employer].  Thanks.

## 2024-05-18 NOTE — Telephone Encounter (Signed)
 Waiting on form to return to office.

## 2024-05-19 NOTE — Telephone Encounter (Signed)
 LM for pt to return my call

## 2024-05-21 LAB — CBC WITH DIFFERENTIAL/PLATELET
Basophils Absolute: 0.1 x10E3/uL (ref 0.0–0.2)
Basos: 1 %
EOS (ABSOLUTE): 0.5 x10E3/uL — ABNORMAL HIGH (ref 0.0–0.4)
Eos: 8 %
Hematocrit: 32.5 % — ABNORMAL LOW (ref 37.5–51.0)
Hemoglobin: 10.8 g/dL — ABNORMAL LOW (ref 13.0–17.7)
Immature Grans (Abs): 0 x10E3/uL (ref 0.0–0.1)
Immature Granulocytes: 0 %
Lymphocytes Absolute: 2.4 x10E3/uL (ref 0.7–3.1)
Lymphs: 35 %
MCH: 34 pg — ABNORMAL HIGH (ref 26.6–33.0)
MCHC: 33.2 g/dL (ref 31.5–35.7)
MCV: 102 fL — ABNORMAL HIGH (ref 79–97)
Monocytes Absolute: 0.6 x10E3/uL (ref 0.1–0.9)
Monocytes: 9 %
Neutrophils Absolute: 3.2 x10E3/uL (ref 1.4–7.0)
Neutrophils: 47 %
Platelets: 211 x10E3/uL (ref 150–450)
RBC: 3.18 x10E6/uL — ABNORMAL LOW (ref 4.14–5.80)
RDW: 15 % (ref 11.6–15.4)
WBC: 6.8 x10E3/uL (ref 3.4–10.8)

## 2024-05-21 LAB — HEMOGLOBIN A1C
Est. average glucose Bld gHb Est-mCnc: 174 mg/dL
Hgb A1c MFr Bld: 7.7 % — ABNORMAL HIGH (ref 4.8–5.6)

## 2024-05-21 LAB — MICROALBUMIN / CREATININE URINE RATIO
Creatinine, Urine: 49.1 mg/dL
Microalb/Creat Ratio: 1208 mg/g{creat} — ABNORMAL HIGH (ref 0–29)
Microalbumin, Urine: 593.3 ug/mL

## 2024-05-21 LAB — COMPREHENSIVE METABOLIC PANEL WITH GFR
ALT: 150 IU/L — ABNORMAL HIGH (ref 0–44)
AST: 88 IU/L — ABNORMAL HIGH (ref 0–40)
Albumin: 4 g/dL (ref 3.9–4.9)
Alkaline Phosphatase: 118 IU/L (ref 47–123)
BUN/Creatinine Ratio: 16 (ref 10–24)
BUN: 57 mg/dL — ABNORMAL HIGH (ref 8–27)
Bilirubin Total: 0.4 mg/dL (ref 0.0–1.2)
CO2: 17 mmol/L — ABNORMAL LOW (ref 20–29)
Calcium: 9 mg/dL (ref 8.6–10.2)
Chloride: 109 mmol/L — ABNORMAL HIGH (ref 96–106)
Creatinine, Ser: 3.57 mg/dL — ABNORMAL HIGH (ref 0.76–1.27)
Globulin, Total: 2.6 g/dL (ref 1.5–4.5)
Glucose: 146 mg/dL — ABNORMAL HIGH (ref 70–99)
Potassium: 5.2 mmol/L (ref 3.5–5.2)
Sodium: 140 mmol/L (ref 134–144)
Total Protein: 6.6 g/dL (ref 6.0–8.5)
eGFR: 18 mL/min/1.73 — ABNORMAL LOW

## 2024-05-21 LAB — LIPID PANEL
Chol/HDL Ratio: 1.7 ratio (ref 0.0–5.0)
Cholesterol, Total: 85 mg/dL — ABNORMAL LOW (ref 100–199)
HDL: 51 mg/dL
LDL Chol Calc (NIH): 18 mg/dL (ref 0–99)
Triglycerides: 79 mg/dL (ref 0–149)
VLDL Cholesterol Cal: 16 mg/dL (ref 5–40)

## 2024-05-21 LAB — URIC ACID: Uric Acid: 3.6 mg/dL — ABNORMAL LOW (ref 3.8–8.4)

## 2024-05-24 ENCOUNTER — Telehealth: Payer: Self-pay | Admitting: Family Medicine

## 2024-05-24 NOTE — Telephone Encounter (Signed)
 Please call pt re: labs and triage him about lower HGB.  A1c stable, lipids at goal, Cr slightly worse than prior.   Main issue is HGB down to 10.8 from 12.5 when checked 2 months ago.   Any bleeding?  Any black stools?  Is he taking iron?  Please let me know.  Thanks.

## 2024-05-25 NOTE — Telephone Encounter (Signed)
 I spoke with Luis Joseph; Luis Joseph notified as instructed and Luis Joseph voiced understanding. Luis Joseph said was admitted to [facility] (Luis Joseph living at [facility] but will keep Dr Cleatus as PCP) on 05/07/24 due to esophageal bleeding and ulcer; endoscopy & cauterization done and no bleeding since then.,now Luis Joseph not having any bleeding,no black stools,. No abd pain and no N&V. Luis Joseph is not taking iron. UC & ED precautions given and Luis Joseph voiced understanding. [facility] [place]. Sending note to Dr Cleatus.

## 2024-05-27 NOTE — Progress Notes (Signed)
 [facility] Follow Up Visit   Patient Name: Luis Joseph., male   Patient DOB: Oct 06, 1955 Date of Service: 05/27/2024  Patient MRN: [id number] Provider Creating Note: Woodward Brought, MD  [phone] Primary Care Physician: Cleatus Arlyss RAMAN, MD   [address] [place] [address] Additional Physicians/ Providers:   Impression/Recommendations   [name] is a 69 y.o. male with hypertension, GERD, gout, anxiety, allergies, diabetes mellitus type II, bariatric surgery who presents for follow up for chronic kidney disease stage IV. Creatinine 3.57, GFR of 18.  KFRE: 40.34% in 2 years and 80.06% in 5 years  1, Chronic kidney disease stage IV: with proteinuria and hyperkalemia. Nephrotic range proteinuria. No history of kidney biopsy. Assumed to be secondary to diabetic nephropathy and hypertension.   - continue dapagliflozin   - continue telmisartan   - not a candidate for mineralocorticoid receptor antagonist due to hyperkalemia   - scheduled kidney smart education for later this month.   - discussed getting a nephrologist closer to home.  - Refer to for renal transplant evaluation.  - increase tirezepatide dose.   2. Hypertension with chronic kidney disease: 119/81.  - Current regimen of furosemide , metoprolol , telmisartan     3. Diabetes mellitus type II with chronic kidney disease: hemoglobin A1c of 7.4% on 12/17/23. No longer requiring insulin .   - continue dapagliflozin   - continue glucose control  4. Secondary Hyperparathyroidism: PTH elevated at 258. Calcium  and phosphorus at goal.   - Continue calcitriol    Patient Active Problem List  Diagnosis   Type 2 diabetes mellitus with diabetic chronic kidney disease (HCC)   Proteinuria   Hypertensive chronic kidney disease, benign, with chronic kidney disease stage I through stage IV, or unspecified   Secondary hyperparathyroidism of renal origin (HCC)   Edema of lower extremity    Chronic kidney disease, Stage IV (severe) (HCC)   Hyperkalemia    Orders Placed This Encounter   Ambulatory referral to Solid Organ Transplant Team   Tirzepatide 5 MG/0.5ML solution auto-injector       Return in about 6 weeks (around 07/08/2024).  Chief Complaint   Chief Complaint  Patient presents with   Follow-up    History of Present Illness   Mr. Luis Joseph. presents for follow up. Patient presents by himself.   Patient was admitted to [facility] in [place], [place] from 12/18 to 12/19 for GI bleed. Patient's hospitalization was complicated by hyperkalemia. 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 He was discharged on sulcralfate and pantoprazole . No medication were discontinued.   Patient states that he is taking all his medications as prescribed.   Patient states his blood pressure and glucose are at goal.   On last visit, patient was referred for kidney smart education. Patient states that he has been postponing this. However he is scheduled for later this month.   Patient lives in [place] but has kept all his health care providers in the [place] area.   Denies any use of nonsteroidal anti-inflammatory agents.   Medications   Current Outpatient Medications:    pantoprazole  (PROTONIX ) 40 MG EC tablet, Take 40 mg by mouth in the morning and 40 mg in the evening., Disp: , Rfl:    sucralfate (CARAFATE) 1 g tablet, Take 1 g by mouth in the morning and 1 g at noon and 1 g in the evening., Disp: , Rfl:    acetaminophen  (TYLENOL ) 500 MG tablet,  Take 500 mg by mouth every 6 (six) hours if needed for mild pain, Disp: , Rfl:    allopurinol  (ZYLOPRIM ) 300 MG tablet, Take 1 tablet by mouth 1 (one) time each day, Disp: , Rfl:    amLODIPine  (NORVASC ) 2.5 MG tablet, TAKE 1 TABLET BY MOUTH 1 TIME EACH DAY., Disp: 90 tablet, Rfl: 3   calcitriol (Rocaltrol) 0.25 MCG capsule, Take 1 capsule (0.25 mcg total) by mouth 1 (one)  time each day, Disp: 30 capsule, Rfl: 11   Dapagliflozin  Propanediol 10 MG tablet, Take 10 mg by mouth 1 (one) time each day, Disp: 30 tablet, Rfl: 3   ezetimibe  (ZETIA ) 10 MG tablet, Take 10 mg by mouth 1 (one) time each day, Disp: , Rfl:    furosemide  (LASIX ) 20 MG tablet, Take 20 mg by mouth 1 (one) time each day, Disp: , Rfl:    metoprolol  succinate XL (TOPROL  XL) 50 MG 24 hr tablet, Take 1 tablet (50 mg total) by mouth 1 (one) time each day Do not crush or chew. (Patient taking differently: Take 50 mg by mouth at bed time Do not crush or chew.), Disp: 30 tablet, Rfl: 11   nitroglycerin  (NITROSTAT ) 0.4 MG SL tablet, Place 0.4 mg under the tongue every 5 (five) minutes if needed, Disp: , Rfl:    rosuvastatin  (CRESTOR ) 10 MG tablet, Take 10 mg by mouth 1 (one) time each day, Disp: , Rfl:    telmisartan  (MICARDIS ) 80 MG tablet, Take 1 tablet (80 mg total) by mouth 1 (one) time each day, Disp: 30 tablet, Rfl: 11   Tirzepatide 5 MG/0.5ML solution auto-injector, Inject 5 mg under the skin per week, Disp: 2 mL, Rfl: 11   Allergies Codeine and Sildenafil  History Past Medical History:  Diagnosis Date   Chronic kidney disease, Stage III (moderate) 02/11/2019   Chronic kidney disease, Stage IV (severe) (HCC) 11/01/2022   Gastroesophageal reflux disease    Generalized anxiety disorder    Gout    Hyperkalemia 11/01/2022   Hyperlipidemia    Hypertensive chronic kidney disease, benign, with chronic kidney disease stage I through stage IV, or unspecified 02/11/2019   Proteinuria 02/11/2019   Seasonal allergic rhinitis    Secondary hyperparathyroidism of renal origin (HCC) 02/11/2019   Stage 3b chronic kidney disease (HCC) 02/11/2019   Type 2 diabetes mellitus with diabetic chronic kidney disease (HCC) 02/11/2019    Past Surgical History:  Procedure Laterality Date   ELBOW SURGERY     GASTRIC BYPASS  02/2015   PARTIAL NEPHRECTOMY Right    Family History  Problem  Relation Age of Onset   Cancer Father        lung Ca   Heart disease Mother    Social History   Tobacco Use   Smoking status: Former    Current packs/day: 0.00    Average packs/day: 1.0 packs/day    Types: Cigarettes    Quit date: 05/21/1977    Years since quitting: 47.0   Smokeless tobacco: Never   Tobacco comments:    Smoking History Info:Some days  Substance Use Topics   Alcohol use: Not Currently     Physical Exam  Vitals BP 113/78 (BP Location: Right upper arm, Patient Position: Standing)   Pulse 71   Temp 97.6 F   Wt 223 lb 8 oz (101 kg)   SpO2 98%   BMI 31.17 kg/m   Vitals reviewed. Constitutional: He is oriented to person, place, and time.  HEENT:  Right Ear: Hearing  normal.  Left Ear: Hearing normal.  Nose: Nose normal. Mouth/Throat: Oropharynx is clear and moist.  Eyes: Conjunctivae are normal. Pupils are equal, round, and reactive to light.  Cardiovascular:  Normal rate, regular rhythm and intact distal pulses.           Pulmonary/Chest: Effort normal and breath sounds normal.  Abdominal: Soft. Bowel sounds are normal.  Neurological: He is alert and oriented to person, place, and time.  Skin: Skin is warm and dry.  Psychiatric: He has a normal mood and affect. His behavior is normal. Judgment normal.     Laboratory Studies  Chemistry  Lab Units 03/18/24 0801 08/22/23 0801 05/30/23 0801 02/21/23 1119 10/24/22 1035  SODIUM mmol/L 139 140 138 140 141  POTASSIUM mmol/L 5.2 5.2 5.3* 4.6 4.7  CHLORIDE mmol/L 107* 109* 105 107 105  CO2 mmol/L 17* 17* 19* 24 23  MAGNESIUM mg/dL  --   --   --   --  2.1  CALCIUM  mg/dL 8.9 8.9 8.9 9.3 9.1  PHOSPHORUS mg/dL 5.3* 4.0 4.5* 4.2 4.0  PTH pg/mL 258* 160* 161* 197* 223*  GLUCOSE mg/dL 846* 827* 872* 820* 857*  ALBUMIN g/dL 4.0 3.9 4.0 3.8 3.8*  BUN mg/dL 63* 58* 50* 53* 44*  CREATININE mg/dL 6.75* 6.91* 7.31* 7.32* 2.90*        No lab exists for component: IRON SATURATION, TRANSSATPER  CBC   Lab Units 03/18/24 0801 08/22/23 0801 05/30/23 0801 02/21/23 1119 10/24/22 1035  WBC AUTO x10E3/uL 7.7 6.6 9.1 6.9 7.1  HEMOGLOBIN g/dL 87.4* 86.6 86.7 87.1* 85.6  HEMOGLOBIN URINE   --   --   --  NEGATIVE  --   HEMATOCRIT % 37.1* 39.8 38.8 38.7 41.3  MCV fL 101* 102* 100* 100.8* 95  PLATELETS AUTO x10E3/uL 184 175 190 179 188    Urine  Lab Units 03/18/24 0801 08/22/23 0801 05/30/23 0801 02/21/23 1119 10/24/22 1035  COLOR U  Yellow Yellow Yellow YELLOW Yellow  KETONES U MG/DL   --   --   --  NEGATIVE  --   UROBILINOGEN UA mg/dL 0.2 0.2 0.2  --  0.2  PROTEIN UR  2+* 3+* 2+*  --  3+*  PROT/CREAT RATIO UR mg/g creat 3,011* 4,002* 4,284* 3.062*  3,062* 5,894*        No lab exists for component: CYCLOSPORITR     Woodward Brought, MD  [facility] [place], [place]

## 2024-05-31 NOTE — Telephone Encounter (Signed)
 Duly noted.  Please see about requesting the records in the meantime.  If he does not have more bleeding in the meantime then we can get follow-up labs at his next visit here if needed.  Thanks.

## 2024-06-01 NOTE — Telephone Encounter (Signed)
 Yes, he is still getting care here.  Please see about mailing him the ROI form.  Thanks.

## 2024-06-01 NOTE — Telephone Encounter (Signed)
 In order to get notes patient will need to fill our roi either in our office or there. Patient [place]. Are we able to still keep as patient?

## 2024-06-03 NOTE — Telephone Encounter (Signed)
 Informed pt that I am mailing him a ROI form.

## 2024-07-09 ENCOUNTER — Ambulatory Visit: Admitting: Orthopedic Surgery

## 2024-07-09 ENCOUNTER — Ambulatory Visit: Admitting: Family Medicine

## 2025-01-04 ENCOUNTER — Ambulatory Visit
